# Patient Record
Sex: Female | Born: 1955
Health system: Southern US, Community
[De-identification: ages and names within clinical notes are randomized; demographics above are authoritative.]

## PROBLEM LIST (undated history)

## (undated) DIAGNOSIS — F411 Generalized anxiety disorder: Secondary | ICD-10-CM

## (undated) DIAGNOSIS — K219 Gastro-esophageal reflux disease without esophagitis: Secondary | ICD-10-CM

## (undated) DIAGNOSIS — Z86010 Personal history of colonic polyps: Secondary | ICD-10-CM

## (undated) DIAGNOSIS — T7840XA Allergy, unspecified, initial encounter: Secondary | ICD-10-CM

## (undated) DIAGNOSIS — R7309 Other abnormal glucose: Secondary | ICD-10-CM

## (undated) DIAGNOSIS — Z5189 Encounter for other specified aftercare: Secondary | ICD-10-CM

## (undated) DIAGNOSIS — E039 Hypothyroidism, unspecified: Secondary | ICD-10-CM

## (undated) DIAGNOSIS — J45909 Unspecified asthma, uncomplicated: Secondary | ICD-10-CM

## (undated) DIAGNOSIS — D649 Anemia, unspecified: Secondary | ICD-10-CM

## (undated) DIAGNOSIS — M1990 Unspecified osteoarthritis, unspecified site: Secondary | ICD-10-CM

## (undated) DIAGNOSIS — I10 Essential (primary) hypertension: Secondary | ICD-10-CM

## (undated) DIAGNOSIS — J309 Allergic rhinitis, unspecified: Secondary | ICD-10-CM

## (undated) HISTORY — DX: Allergic rhinitis, unspecified: J30.9

## (undated) HISTORY — PX: COLONOSCOPY: SHX174

## (undated) HISTORY — DX: Unspecified osteoarthritis, unspecified site: M19.90

## (undated) HISTORY — PX: DILATION AND CURETTAGE OF UTERUS: SHX78

## (undated) HISTORY — DX: Generalized anxiety disorder: F41.1

## (undated) HISTORY — DX: Other abnormal glucose: R73.09

## (undated) HISTORY — DX: Allergy, unspecified, initial encounter: T78.40XA

## (undated) HISTORY — DX: Gastro-esophageal reflux disease without esophagitis: K21.9

## (undated) HISTORY — DX: Encounter for other specified aftercare: Z51.89

## (undated) HISTORY — DX: Essential (primary) hypertension: I10

## (undated) HISTORY — DX: Unspecified asthma, uncomplicated: J45.909

## (undated) HISTORY — DX: Hypothyroidism, unspecified: E03.9

## (undated) HISTORY — DX: Anemia, unspecified: D64.9

## (undated) HISTORY — DX: Personal history of colonic polyps: Z86.010

---

## 1993-05-07 HISTORY — PX: HERNIA REPAIR: SHX51

## 1995-05-08 HISTORY — PX: VEIN SURGERY: SHX48

## 1995-05-08 HISTORY — PX: MYOMECTOMY: SHX85

## 2002-05-07 HISTORY — PX: BREAST SURGERY: SHX581

## 2002-05-07 HISTORY — PX: THYROIDECTOMY, PARTIAL: SHX18

## 2004-05-07 HISTORY — PX: FOOT SURGERY: SHX648

## 2006-11-14 ENCOUNTER — Ambulatory Visit: Payer: Self-pay | Admitting: Family Medicine

## 2006-11-14 ENCOUNTER — Ambulatory Visit: Payer: Self-pay | Admitting: *Deleted

## 2006-11-15 ENCOUNTER — Ambulatory Visit (HOSPITAL_COMMUNITY): Admission: RE | Admit: 2006-11-15 | Discharge: 2006-11-15 | Payer: Self-pay | Admitting: Family Medicine

## 2007-01-06 ENCOUNTER — Emergency Department (HOSPITAL_COMMUNITY): Admission: EM | Admit: 2007-01-06 | Discharge: 2007-01-06 | Payer: Self-pay | Admitting: *Deleted

## 2007-03-03 ENCOUNTER — Ambulatory Visit: Payer: Self-pay | Admitting: Internal Medicine

## 2007-03-31 ENCOUNTER — Emergency Department (HOSPITAL_COMMUNITY): Admission: EM | Admit: 2007-03-31 | Discharge: 2007-03-31 | Payer: Self-pay | Admitting: Emergency Medicine

## 2007-04-07 ENCOUNTER — Ambulatory Visit: Payer: Self-pay | Admitting: Internal Medicine

## 2007-04-07 ENCOUNTER — Encounter (INDEPENDENT_AMBULATORY_CARE_PROVIDER_SITE_OTHER): Payer: Self-pay | Admitting: Family Medicine

## 2007-04-07 LAB — CONVERTED CEMR LAB
AST: 29 units/L (ref 0–37)
Albumin: 3.9 g/dL (ref 3.5–5.2)
Alkaline Phosphatase: 102 units/L (ref 39–117)
Basophils Relative: 1 % (ref 0–1)
Chlamydia, DNA Probe: NEGATIVE
Chloride: 107 meq/L (ref 96–112)
Creatinine, Ser: 0.79 mg/dL (ref 0.40–1.20)
Eosinophils Relative: 5 % (ref 0–5)
Hemoglobin: 8.7 g/dL — ABNORMAL LOW (ref 12.0–15.0)
MCHC: 27.5 g/dL — ABNORMAL LOW (ref 30.0–36.0)
Monocytes Absolute: 0.5 10*3/uL (ref 0.1–1.0)
Monocytes Relative: 9 % (ref 3–12)
Neutro Abs: 2.9 10*3/uL (ref 1.7–7.7)
Platelets: 239 10*3/uL (ref 150–400)
RDW: 19 % — ABNORMAL HIGH (ref 11.5–15.5)
Sodium: 138 meq/L (ref 135–145)
TSH: 1.418 microintl units/mL (ref 0.350–5.50)
Total Protein: 6.9 g/dL (ref 6.0–8.3)

## 2007-04-08 ENCOUNTER — Ambulatory Visit (HOSPITAL_COMMUNITY): Admission: RE | Admit: 2007-04-08 | Discharge: 2007-04-08 | Payer: Self-pay | Admitting: Family Medicine

## 2007-04-29 ENCOUNTER — Ambulatory Visit: Payer: Self-pay | Admitting: Family Medicine

## 2007-04-29 LAB — CONVERTED CEMR LAB
HCV Ab: POSITIVE — AB
HCV Genotype: 1
HCV Quantitative: 257000 [IU]/mL — ABNORMAL HIGH
Hep A Total Ab: NEGATIVE
Hep B Core Total Ab: NEGATIVE
Hep B E Ab: NEGATIVE
Hep B S Ab: NEGATIVE

## 2007-05-22 ENCOUNTER — Ambulatory Visit: Payer: Self-pay | Admitting: Family Medicine

## 2007-06-05 ENCOUNTER — Emergency Department (HOSPITAL_COMMUNITY): Admission: EM | Admit: 2007-06-05 | Discharge: 2007-06-05 | Payer: Self-pay | Admitting: Emergency Medicine

## 2007-06-09 ENCOUNTER — Ambulatory Visit (HOSPITAL_COMMUNITY): Admission: RE | Admit: 2007-06-09 | Discharge: 2007-06-09 | Payer: Self-pay | Admitting: Family Medicine

## 2007-06-25 ENCOUNTER — Ambulatory Visit: Payer: Self-pay | Admitting: Internal Medicine

## 2007-06-26 ENCOUNTER — Ambulatory Visit: Payer: Self-pay | Admitting: Internal Medicine

## 2007-06-27 ENCOUNTER — Ambulatory Visit: Payer: Self-pay | Admitting: Internal Medicine

## 2007-07-19 ENCOUNTER — Emergency Department (HOSPITAL_COMMUNITY): Admission: EM | Admit: 2007-07-19 | Discharge: 2007-07-19 | Payer: Self-pay | Admitting: Emergency Medicine

## 2007-09-12 ENCOUNTER — Emergency Department (HOSPITAL_COMMUNITY): Admission: EM | Admit: 2007-09-12 | Discharge: 2007-09-12 | Payer: Self-pay | Admitting: Emergency Medicine

## 2007-10-09 ENCOUNTER — Emergency Department (HOSPITAL_COMMUNITY): Admission: EM | Admit: 2007-10-09 | Discharge: 2007-10-09 | Payer: Self-pay | Admitting: Emergency Medicine

## 2007-10-11 ENCOUNTER — Emergency Department (HOSPITAL_COMMUNITY): Admission: EM | Admit: 2007-10-11 | Discharge: 2007-10-11 | Payer: Self-pay | Admitting: Emergency Medicine

## 2007-10-13 ENCOUNTER — Emergency Department (HOSPITAL_COMMUNITY): Admission: EM | Admit: 2007-10-13 | Discharge: 2007-10-13 | Payer: Self-pay | Admitting: Emergency Medicine

## 2007-10-28 ENCOUNTER — Emergency Department (HOSPITAL_COMMUNITY): Admission: EM | Admit: 2007-10-28 | Discharge: 2007-10-29 | Payer: Self-pay | Admitting: Physician Assistant

## 2007-10-30 ENCOUNTER — Inpatient Hospital Stay (HOSPITAL_COMMUNITY): Admission: EM | Admit: 2007-10-30 | Discharge: 2007-11-04 | Payer: Self-pay | Admitting: Emergency Medicine

## 2007-12-15 ENCOUNTER — Emergency Department (HOSPITAL_COMMUNITY): Admission: EM | Admit: 2007-12-15 | Discharge: 2007-12-15 | Payer: Self-pay | Admitting: Emergency Medicine

## 2008-02-21 ENCOUNTER — Emergency Department (HOSPITAL_COMMUNITY): Admission: EM | Admit: 2008-02-21 | Discharge: 2008-02-21 | Payer: Self-pay | Admitting: Emergency Medicine

## 2008-03-18 ENCOUNTER — Emergency Department (HOSPITAL_COMMUNITY): Admission: EM | Admit: 2008-03-18 | Discharge: 2008-03-18 | Payer: Self-pay | Admitting: Emergency Medicine

## 2008-04-25 ENCOUNTER — Emergency Department (HOSPITAL_COMMUNITY): Admission: EM | Admit: 2008-04-25 | Discharge: 2008-04-25 | Payer: Self-pay | Admitting: Emergency Medicine

## 2008-04-28 ENCOUNTER — Emergency Department (HOSPITAL_COMMUNITY): Admission: EM | Admit: 2008-04-28 | Discharge: 2008-04-28 | Payer: Self-pay | Admitting: Emergency Medicine

## 2008-05-25 ENCOUNTER — Emergency Department (HOSPITAL_COMMUNITY): Admission: EM | Admit: 2008-05-25 | Discharge: 2008-05-25 | Payer: Self-pay | Admitting: Family Medicine

## 2008-06-11 ENCOUNTER — Emergency Department (HOSPITAL_COMMUNITY): Admission: EM | Admit: 2008-06-11 | Discharge: 2008-06-11 | Payer: Self-pay | Admitting: Emergency Medicine

## 2008-07-21 ENCOUNTER — Emergency Department (HOSPITAL_COMMUNITY): Admission: EM | Admit: 2008-07-21 | Discharge: 2008-07-21 | Payer: Self-pay | Admitting: Emergency Medicine

## 2008-08-20 ENCOUNTER — Ambulatory Visit: Payer: Self-pay | Admitting: Internal Medicine

## 2008-08-20 DIAGNOSIS — E039 Hypothyroidism, unspecified: Secondary | ICD-10-CM | POA: Insufficient documentation

## 2008-08-20 DIAGNOSIS — J45909 Unspecified asthma, uncomplicated: Secondary | ICD-10-CM

## 2008-08-20 DIAGNOSIS — F419 Anxiety disorder, unspecified: Secondary | ICD-10-CM

## 2008-08-20 DIAGNOSIS — F329 Major depressive disorder, single episode, unspecified: Secondary | ICD-10-CM

## 2008-08-20 DIAGNOSIS — K219 Gastro-esophageal reflux disease without esophagitis: Secondary | ICD-10-CM

## 2008-08-20 DIAGNOSIS — D649 Anemia, unspecified: Secondary | ICD-10-CM

## 2008-08-20 DIAGNOSIS — J309 Allergic rhinitis, unspecified: Secondary | ICD-10-CM | POA: Insufficient documentation

## 2008-08-20 DIAGNOSIS — I10 Essential (primary) hypertension: Secondary | ICD-10-CM

## 2008-08-20 LAB — CONVERTED CEMR LAB
ALT: 39 units/L — ABNORMAL HIGH (ref 0–35)
AST: 40 units/L — ABNORMAL HIGH (ref 0–37)
Albumin: 3.5 g/dL (ref 3.5–5.2)
BUN: 9 mg/dL (ref 6–23)
Basophils Relative: 1 % (ref 0.0–3.0)
CO2: 28 meq/L (ref 19–32)
Creatinine, Ser: 0.7 mg/dL (ref 0.4–1.2)
Eosinophils Relative: 6.9 % — ABNORMAL HIGH (ref 0.0–5.0)
GFR calc non Af Amer: 113.35 mL/min (ref >60)
Glucose, Bld: 100 mg/dL — ABNORMAL HIGH (ref 70–99)
HCT: 37 % (ref 36.0–46.0)
MCV: 83.1 fL (ref 78.0–100.0)
Neutrophils Relative %: 56.3 % (ref 43.0–77.0)
Nitrite: NEGATIVE
Potassium: 4.5 meq/L (ref 3.5–5.1)
RBC: 4.45 M/uL (ref 3.87–5.11)
RDW: 17.5 % — ABNORMAL HIGH (ref 11.5–14.6)
TSH: 0.31 microintl units/mL — ABNORMAL LOW (ref 0.35–5.50)
Total Bilirubin: 0.5 mg/dL (ref 0.3–1.2)
Total Protein: 7.3 g/dL (ref 6.0–8.3)
Urine Glucose: NEGATIVE mg/dL
pH: 7 (ref 5.0–8.0)

## 2008-08-23 ENCOUNTER — Encounter: Payer: Self-pay | Admitting: Internal Medicine

## 2008-09-03 ENCOUNTER — Telehealth: Payer: Self-pay | Admitting: Internal Medicine

## 2008-09-03 ENCOUNTER — Ambulatory Visit: Payer: Self-pay | Admitting: Internal Medicine

## 2008-09-03 DIAGNOSIS — M549 Dorsalgia, unspecified: Secondary | ICD-10-CM | POA: Insufficient documentation

## 2008-09-03 DIAGNOSIS — R748 Abnormal levels of other serum enzymes: Secondary | ICD-10-CM | POA: Insufficient documentation

## 2008-09-03 DIAGNOSIS — R799 Abnormal finding of blood chemistry, unspecified: Secondary | ICD-10-CM | POA: Insufficient documentation

## 2008-09-03 DIAGNOSIS — R7309 Other abnormal glucose: Secondary | ICD-10-CM

## 2008-09-03 LAB — CONVERTED CEMR LAB
Basophils Relative: 2.2 % (ref 0.0–3.0)
Eosinophils Absolute: 0.6 10*3/uL (ref 0.0–0.7)
HCT: 41.2 % (ref 36.0–46.0)
Hemoglobin: 13.8 g/dL (ref 12.0–15.0)
Hep A Total Ab: NEGATIVE
Hep B Core Total Ab: NEGATIVE
Hep B S Ab: NEGATIVE
Hgb A1c MFr Bld: 6.1 % (ref 4.6–6.5)
Lymphs Abs: 1.4 10*3/uL (ref 0.7–4.0)
MCV: 81.5 fL (ref 78.0–100.0)
Monocytes Relative: 0.6 % — ABNORMAL LOW (ref 3.0–12.0)
Neutro Abs: 10.3 10*3/uL — ABNORMAL HIGH (ref 1.4–7.7)
Platelets: 35 10*3/uL — CL (ref 150.0–400.0)
RDW: 17.5 % — ABNORMAL HIGH (ref 11.5–14.6)
Total Bilirubin: 0.6 mg/dL (ref 0.3–1.2)
WBC: 12.7 10*3/uL — ABNORMAL HIGH (ref 4.5–10.5)

## 2008-09-08 ENCOUNTER — Encounter: Payer: Self-pay | Admitting: Internal Medicine

## 2008-12-31 ENCOUNTER — Telehealth (INDEPENDENT_AMBULATORY_CARE_PROVIDER_SITE_OTHER): Payer: Self-pay | Admitting: *Deleted

## 2009-02-07 ENCOUNTER — Telehealth: Payer: Self-pay | Admitting: Internal Medicine

## 2009-03-10 ENCOUNTER — Telehealth: Payer: Self-pay | Admitting: Internal Medicine

## 2009-03-30 ENCOUNTER — Telehealth: Payer: Self-pay | Admitting: Internal Medicine

## 2009-04-06 ENCOUNTER — Encounter: Payer: Self-pay | Admitting: Internal Medicine

## 2009-04-28 ENCOUNTER — Emergency Department (HOSPITAL_COMMUNITY): Admission: EM | Admit: 2009-04-28 | Discharge: 2009-04-28 | Payer: Self-pay | Admitting: Emergency Medicine

## 2009-05-07 DIAGNOSIS — Z860100 Personal history of colon polyps, unspecified: Secondary | ICD-10-CM

## 2009-05-07 HISTORY — DX: Personal history of colon polyps, unspecified: Z86.0100

## 2009-05-07 HISTORY — DX: Personal history of colonic polyps: Z86.010

## 2009-05-12 ENCOUNTER — Emergency Department (HOSPITAL_COMMUNITY): Admission: EM | Admit: 2009-05-12 | Discharge: 2009-05-12 | Payer: Self-pay | Admitting: Emergency Medicine

## 2009-08-14 ENCOUNTER — Emergency Department (HOSPITAL_COMMUNITY): Admission: EM | Admit: 2009-08-14 | Discharge: 2009-08-14 | Payer: Self-pay | Admitting: Family Medicine

## 2009-09-19 ENCOUNTER — Emergency Department (HOSPITAL_COMMUNITY): Admission: EM | Admit: 2009-09-19 | Discharge: 2009-09-19 | Payer: Self-pay | Admitting: Family Medicine

## 2009-10-13 ENCOUNTER — Telehealth: Payer: Self-pay | Admitting: Internal Medicine

## 2009-12-08 ENCOUNTER — Ambulatory Visit: Payer: Self-pay | Admitting: Internal Medicine

## 2009-12-08 DIAGNOSIS — M722 Plantar fascial fibromatosis: Secondary | ICD-10-CM | POA: Insufficient documentation

## 2010-01-08 ENCOUNTER — Emergency Department (HOSPITAL_COMMUNITY): Admission: EM | Admit: 2010-01-08 | Discharge: 2010-01-08 | Payer: Self-pay | Admitting: Family Medicine

## 2010-01-11 ENCOUNTER — Ambulatory Visit: Payer: Self-pay | Admitting: Internal Medicine

## 2010-01-11 LAB — CONVERTED CEMR LAB
AST: 33 units/L (ref 0–37)
BUN: 19 mg/dL (ref 6–23)
Basophils Absolute: 0.1 10*3/uL (ref 0.0–0.1)
Basophils Relative: 0.8 % (ref 0.0–3.0)
CO2: 27 meq/L (ref 19–32)
Chloride: 102 meq/L (ref 96–112)
Cholesterol: 184 mg/dL (ref 0–200)
Eosinophils Absolute: 0.3 10*3/uL (ref 0.0–0.7)
GFR calc non Af Amer: 82.24 mL/min (ref >60)
HCT: 37.5 % (ref 36.0–46.0)
HDL: 37.4 mg/dL — ABNORMAL LOW (ref >39.00)
Hemoglobin, Urine: NEGATIVE
Lymphocytes Relative: 30 % (ref 12.0–46.0)
Lymphs Abs: 2.1 10*3/uL (ref 0.7–4.0)
Monocytes Relative: 7.2 % (ref 3.0–12.0)
Neutro Abs: 4.1 10*3/uL (ref 1.4–7.7)
Nitrite: NEGATIVE
Platelets: 149 10*3/uL — ABNORMAL LOW (ref 150.0–400.0)
Potassium: 4.5 meq/L (ref 3.5–5.1)
RBC: 4.39 M/uL (ref 3.87–5.11)
Specific Gravity, Urine: 1.025 (ref 1.000–1.030)
Total Protein, Urine: NEGATIVE mg/dL
Triglycerides: 116 mg/dL (ref 0.0–149.0)
Urine Glucose: NEGATIVE mg/dL
Urobilinogen, UA: 4 (ref 0.0–1.0)
VLDL: 23.2 mg/dL (ref 0.0–40.0)
WBC: 7.1 10*3/uL (ref 4.5–10.5)
pH: 6.5 (ref 5.0–8.0)

## 2010-01-13 ENCOUNTER — Encounter: Payer: Self-pay | Admitting: Internal Medicine

## 2010-01-13 ENCOUNTER — Ambulatory Visit: Payer: Self-pay | Admitting: Internal Medicine

## 2010-01-17 ENCOUNTER — Encounter: Payer: Self-pay | Admitting: Gastroenterology

## 2010-03-09 ENCOUNTER — Encounter (INDEPENDENT_AMBULATORY_CARE_PROVIDER_SITE_OTHER): Payer: Self-pay | Admitting: *Deleted

## 2010-03-13 ENCOUNTER — Ambulatory Visit: Payer: Self-pay | Admitting: Gastroenterology

## 2010-03-22 ENCOUNTER — Telehealth: Payer: Self-pay | Admitting: Internal Medicine

## 2010-03-27 ENCOUNTER — Ambulatory Visit: Payer: Self-pay | Admitting: Gastroenterology

## 2010-03-27 LAB — HM COLONOSCOPY

## 2010-03-29 ENCOUNTER — Encounter: Payer: Self-pay | Admitting: Gastroenterology

## 2010-04-19 ENCOUNTER — Ambulatory Visit (HOSPITAL_COMMUNITY)
Admission: RE | Admit: 2010-04-19 | Discharge: 2010-04-19 | Payer: Self-pay | Source: Home / Self Care | Attending: Internal Medicine | Admitting: Internal Medicine

## 2010-04-28 ENCOUNTER — Encounter: Payer: Self-pay | Admitting: Internal Medicine

## 2010-05-04 ENCOUNTER — Encounter
Admission: RE | Admit: 2010-05-04 | Discharge: 2010-05-04 | Payer: Self-pay | Source: Home / Self Care | Attending: Internal Medicine | Admitting: Internal Medicine

## 2010-05-23 ENCOUNTER — Emergency Department (HOSPITAL_COMMUNITY)
Admission: EM | Admit: 2010-05-23 | Discharge: 2010-05-23 | Payer: Self-pay | Source: Home / Self Care | Admitting: Family Medicine

## 2010-05-28 ENCOUNTER — Encounter: Payer: Self-pay | Admitting: Family Medicine

## 2010-06-07 NOTE — Assessment & Plan Note (Signed)
Summary: CPX-LB  RS'D DUE TO BUMP/NWS   Vital Signs:  Patient profile:   55 year old female Height:      62 inches (157.48 cm) Weight:      214.8 pounds (97.64 kg) O2 Sat:      98 % on Room air Temp:     97.1 degrees F (36.17 degrees C) oral Pulse rate:   85 / minute BP sitting:   128 / 84  (right arm) Cuff size:   large  Vitals Entered By: Orlan Leavens RMA (January 13, 2010 2:56 PM)  O2 Flow:  Room air CC: CPX Is Patient Diabetic? No Pain Assessment Patient in pain? no        Primary Care Provider:  Newt Lukes MD  CC:  CPX.  History of Present Illness: patient is here today for annual physical. Patient feels well and has no complaints.   also review other medical issues:  depression/anxiety - has been on lexapro and celexa in past - resumed citalopram, trazodone in 12/2009 along with seroquel - feels better sleeping well - no adv SE and 100% med compliance  plantar fascitis, right- c/o continued right foot and ankle pain - foot pain  worst at heel  onset symptoms late spring 2011- pain worst when getting out of bed in AM - 1st steps "real bad" - now a/w swelling at bottom of foor and lateral ankle - no sig relief with ibuprofen or tylenol - improved with voltaren but not resolved ?pain pill for help sleeping at night until she sees podiatry (appt next week)  HTN - reports compliance with ongoing medical treatment and no changes in medication dose or frequency. denies adverse side effects related to current therapy.  no CP or edema  hypothyroid - reports compliance with ongoing medical treatment and no changes in medication dose or frequency. denies adverse side effects related to current therapy.   asthma - reports compliance with ongoing medical treatment and no changes in medication dose or frequency. denies adverse side effects related to current therapy.    Preventive Screening-Counseling & Management  Alcohol-Tobacco     Alcohol drinks/day: <1  Alcohol Counseling: not indicated; patient does not drink     Smoking Status: current     Smoking Cessation Counseling: yes     Smoke Cessation Stage: precontemplative     Packs/Day: 0.75     Year Started: 1984     Pack years: 50  Caffeine-Diet-Exercise     Does Patient Exercise: no     Exercise Counseling: to improve exercise regimen     Depression Counseling: not indicated; screening negative for depression  Safety-Violence-Falls     Seat Belt Counseling: not applicable     Firearms in the Home: no firearms in the home     Smoke Detectors: yes     Violence in the Home: no risk noted     Sexual Abuse: no     Fall Risk Counseling: not indicated; no significant falls noted  Clinical Review Panels:  Immunizations   Last Tetanus Booster:  Historical (05/07/2006)   Last Flu Vaccine:  Fluvax 3+ (01/13/2010)  Lipid Management   Cholesterol:  184 (01/11/2010)   LDL (bad choesterol):  123 (01/11/2010)   HDL (good cholesterol):  37.40 (01/11/2010)  CBC   WBC:  7.1 (01/11/2010)   RBC:  4.39 (01/11/2010)   Hgb:  12.3 (01/11/2010)   Hct:  37.5 (01/11/2010)   Platelets:  149.0 (01/11/2010)   MCV  85.5 (01/11/2010)   MCHC  32.7 (01/11/2010)   RDW  16.9 (01/11/2010)   PMN:  57.9 (01/11/2010)   Lymphs:  30.0 (01/11/2010)   Monos:  7.2 (01/11/2010)   Eosinophils:  4.1 (01/11/2010)   Basophil:  0.8 (01/11/2010)  Complete Metabolic Panel   Glucose:  96 (01/11/2010)   Sodium:  137 (01/11/2010)   Potassium:  4.5 (01/11/2010)   Chloride:  102 (01/11/2010)   CO2:  27 (01/11/2010)   BUN:  19 (01/11/2010)   Creatinine:  0.9 (01/11/2010)   Albumin:  3.7 (01/11/2010)   Total Protein:  7.1 (01/11/2010)   Calcium:  8.9 (01/11/2010)   Total Bili:  0.4 (01/11/2010)   Alk Phos:  83 (01/11/2010)   SGPT (ALT):  32 (01/11/2010)   SGOT (AST):  33 (01/11/2010)   Current Medications (verified): 1)  Lisinopril-Hydrochlorothiazide 10-12.5 Mg Tabs (Lisinopril-Hydrochlorothiazide) .... Take 1  Tablet By Mouth Once A Day 2)  Levothyroxine Sodium 100 Mcg Tabs (Levothyroxine Sodium) .... Take 1 Tablet By Mouth Once A Day 3)  Buspar 10 Mg Tabs (Buspirone Hcl) .... One By Mouth Tid 4)  Celexa 40 Mg Tabs (Citalopram Hydrobromide) .... Once Daily 5)  Proair Hfa 108 (90 Base) Mcg/act Aers (Albuterol Sulfate) .Marland Kitchen.. 1-2 Puffs Qid As Needed For Wheezing 6)  Trazodone Hcl 100 Mg Tabs (Trazodone Hcl) .... 1/2 - 1 By Mouth At Bedtime As Needed For Insomnia 7)  Qvar 40 Mcg/act Aers (Beclomethasone Dipropionate) .... One Puff Two Times A Day Every Day For Asthma 8)  Nasonex 50 Mcg/act Susp (Mometasone Furoate) .... 2 Puffs Each Nostril Once Daily 9)  Ultram 50 Mg Tabs (Tramadol Hcl) .Marland Kitchen.. 1-2 By Mouth Q 6 Hours As Needed For Pain 10)  Seroquel Xr 50 Mg Xr24h-Tab (Quetiapine Fumarate) .Marland Kitchen.. 1 By Mouth Once Daily 11)  Voltaren 1 % Gel (Diclofenac Sodium) .... Apply To Painful Area On Foot Three Times A Day X 5 Days, Then As Needed  Allergies (verified): 1)  ! Naprosyn  Past History:  Past medical, surgical, family and social histories (including risk factors) reviewed, and no changes noted (except as noted below).  Past Medical History: Allergic rhinitis Anxiety/depression Asthma GERD Hypertension Hypothyroidism  Past Surgical History: Inguinal herniorrhaphy - myomectomy - 1997 Lumpectomy, right side - benign - 2004 Thyroidectomy- partial left side - 2004  Family History: Reviewed history from 12/08/2009 and no changes required. Family History of Arthritis Family History Hypertension Family Hx - DM, panc cancer  mom - age 61y - HTN, OA dad - expired age 110y - MVA - healthy prior  sister - DM  Social History: Reviewed history from 12/08/2009 and no changes required. 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 I have reviewed all other systems and they were negative.   Physical Exam  General:  overweight-appearing.  alert, well-developed, well-nourished, and cooperative to examination.    Head:  Normocephalic and atraumatic without obvious abnormalities. No apparent alopecia or balding. Eyes:  vision grossly intact; pupils equal, round and reactive to light.  conjunctiva and lids normal.    Ears:  normal pinnae bilaterally, without erythema, swelling, or tenderness to palpation. TMs clear, without effusion, or cerumen impaction. Hearing grossly normal bilaterally  Mouth:  teeth and gums in good repair; mucous membranes moist, without lesions or ulcers. oropharynx clear without  exudate, no erythema.  Neck:  thick, supple, full ROM, no masses, no thyromegaly; no thyroid nodules or tenderness. no JVD or carotid bruits.    Lungs:  normal respiratory effort, no intercostal retractions or use of accessory muscles; normal breath sounds bilaterally - no crackles and no wheezes.    Heart:  normal rate, regular rhythm, no murmur, and no rub. BLE without edema. normal DP pulses and normal cap refill in all 4 extremities    Abdomen:  soft, non-tender, normal bowel sounds, no distention; no masses and no appreciable hepatomegaly or splenomegaly.   Genitalia:  defer to gyn Msk:  right plantar fascia tender to palp and mildly swollen - ankle FROM w/o pain but tender to palp over lateral side Neurologic:  alert & oriented X3 and cranial nerves II-XII symetrically intact.  strength normal in all extremities, sensation intact to light touch, and gait normal. speech fluent without dysarthria or aphasia; follows commands with good comprehension.  Skin:  no rashes, vesicles, ulcers, or erythema. No nodules or irregularity to palpation. multiple skin tags at neck Psych:  Oriented X3, memory intact for recent and remote, normally interactive, good eye contact, not anxious appearing, not depressed appearing, and not  agitated.      Impression & Recommendations:  Problem # 1:  PREVENTIVE HEALTH CARE (ICD-V70.0) Patient has been counseled on age-appropriate routine health concerns for screening and prevention. These are reviewed and up-to-date. Immunizations are up-to-date or declined. Labs and ECG reviewed.  Orders: EKG w/ Interpretation (93000) Gynecologic Referral (Gyn) Gastroenterology Referral (GI) Misc. Referral (Misc. Ref)  Problem # 2:  PLANTAR FASCIITIS, RIGHT (ICD-728.71)  cont tx voltaren gel, improved but not resolved - planning to see podiatry for same ok to use at bedtime vicodin as needed - no refills to be provided for same  Problem # 3:  ANXIETY (ICD-300.00)  Her updated medication list for this problem includes:    Buspar 10 Mg Tabs (Buspirone hcl) ..... One by mouth tid    Celexa 40 Mg Tabs (Citalopram hydrobromide) ..... Once daily    Trazodone Hcl 100 Mg Tabs (Trazodone hcl) .Marland Kitchen... 1/2 - 1 by mouth at bedtime as needed for insomnia  symptoms seem improved - cont same combo tx  Problem # 4:  HYPERTENSION (ICD-401.9)  Her updated medication list for this problem includes:    Lisinopril-hydrochlorothiazide 10-12.5 Mg Tabs (Lisinopril-hydrochlorothiazide) .Marland Kitchen... Take 1 tablet by mouth once a day  BP today: 128/84 Prior BP: 122/82 (12/08/2009)  Prior 10 Yr Risk Heart Disease: Not enough information (08/20/2008)  Labs Reviewed: K+: 4.5 (01/11/2010) Creat: : 0.9 (01/11/2010)   Chol: 184 (01/11/2010)   HDL: 37.40 (01/11/2010)   LDL: 123 (01/11/2010)   TG: 116.0 (01/11/2010)  Problem # 5:  HYPOTHYROIDISM (ICD-244.9)  Her updated medication list for this problem includes:    Levothyroxine Sodium 100 Mcg Tabs (Levothyroxine sodium) .Marland Kitchen... Take 1 tablet by mouth once a day s/p prior thyroidectomy - on same tx w/o change >73yr - mild depressed TSH reviewed - will cont same for now and recheck 3 mo, sooner if symptoms orproblems - pt agrees to same - reviewed signs of hyper and  hypo thyroid today  Labs Reviewed: TSH: 0.21 (01/11/2010)    HgBA1c: 6.1 (09/03/2008) Chol: 184 (01/11/2010)   HDL: 37.40 (01/11/2010)   LDL: 123 (01/11/2010)   TG: 116.0 (01/11/2010)  Complete Medication List: 1)  Lisinopril-hydrochlorothiazide 10-12.5 Mg Tabs (Lisinopril-hydrochlorothiazide) .... Take 1 tablet by mouth once a day 2)  Levothyroxine Sodium 100  Mcg Tabs (Levothyroxine sodium) .... Take 1 tablet by mouth once a day 3)  Buspar 10 Mg Tabs (Buspirone hcl) .... One by mouth tid 4)  Celexa 40 Mg Tabs (Citalopram hydrobromide) .... Once daily 5)  Proair Hfa 108 (90 Base) Mcg/act Aers (Albuterol sulfate) .Marland Kitchen.. 1-2 puffs qid as needed for wheezing 6)  Trazodone Hcl 100 Mg Tabs (Trazodone hcl) .... 1/2 - 1 by mouth at bedtime as needed for insomnia 7)  Qvar 40 Mcg/act Aers (Beclomethasone dipropionate) .... One puff two times a day every day for asthma 8)  Nasonex 50 Mcg/act Susp (Mometasone furoate) .... 2 puffs each nostril once daily 9)  Ultram 50 Mg Tabs (Tramadol hcl) .Marland Kitchen.. 1-2 by mouth q 6 hours as needed for pain 10)  Seroquel Xr 50 Mg Xr24h-tab (Quetiapine fumarate) .Marland Kitchen.. 1 by mouth once daily 11)  Voltaren 1 % Gel (Diclofenac sodium) .... Apply to painful area on foot three times a day x 5 days, then as needed 12)  Hydrocodone-acetaminophen 5-500 Mg Tabs (Hydrocodone-acetaminophen) .Marland Kitchen.. 1 by mouth at bedtime as needed for pain  Other Orders: Admin 1st Vaccine (19147) Flu Vaccine 59yrs + (82956) Flu Vaccine Consent Questions     Do you have a history of severe allergic reactions to this vaccine? no    Any prior history of allergic reactions to egg and/or gelatin? no    Do you have a sensitivity to the preservative Thimersol? no    Do you have a past history of Guillan-Barre Syndrome? no    Do you currently have an acute febrile illness? no    Have you ever had a severe reaction to latex? no    Vaccine information given and explained to patient? yes    Are you currently  pregnant? no    Lot Number:[id number]   Exp Date:11/04/2010   Site Given  Right Deltoid IM  Patient Instructions: 1)  it was good to see you today. 2)  continue celexa with seroquel for anxiety and depression  symptoms - 3)  continue to use voltaren gel for foot pain and may use vicodin as needed at night (at least until you see the foot doctor) - prescription provided today 4)  we'll make referral to gynecology, GI for colonoscopy and mammogram. Our office will contact you regarding these appointments once made.  5)  Please schedule a follow-up appointment in 3-4 months to monitor thyroid, blood pressure and depression symptoms; call sooner if problems.  Prescriptions: HYDROCODONE-ACETAMINOPHEN 5-500 MG TABS (HYDROCODONE-ACETAMINOPHEN) 1 by mouth at bedtime as needed for pain  #30 x 0   Entered and Authorized by:   Newt Lukes MD   Signed by:   Newt Lukes MD on 01/13/2010   Method used:   Print then Give to Patient   RxID:   [id number]    .lbflu

## 2010-06-07 NOTE — Procedures (Signed)
Summary: Colonoscopy  Patient: [name] Note: All result statuses are Final unless otherwise noted.  Tests: (1) Colonoscopy (COL)   COL Colonoscopy           DONE     [facility]     [address]     [place], [place]  [address]           COLONOSCOPY PROCEDURE REPORT           PATIENT:  Wendy Carr, Wendy Carr  MR#:  [id number]     BIRTHDATE:  06-02-1955, 54 yrs. old  GENDER:  female     ENDOSCOPIST:  Vania Rea. Jarold Motto, MD, [facility]     REF. BY:  Rene Paci, M.D.     PROCEDURE DATE:  03/27/2010     PROCEDURE:  Colonoscopy with snare polypectomy     ASA CLASS:  Class II     INDICATIONS:  Routine Risk Screening     MEDICATIONS:   Fentanyl 75 mcg IV, Versed 10 mg IV           DESCRIPTION OF PROCEDURE:   After the risks benefits and     alternatives of the procedure were thoroughly explained, informed     consent was obtained.  Digital rectal exam was performed and     revealed no abnormalities.   The LB CF-[id number] endoscope     was introduced through the anus and advanced to the cecum, which     was identified by both the appendix and ileocecal valve, limited     by a redundant colon.    The quality of the prep was excellent,     using MoviPrep.  The instrument was then slowly withdrawn as the     colon was fully examined.     <<PROCEDUREIMAGES>>           FINDINGS:  There were multiple polyps identified and removed.     transverse to sigmoid FLAT 3-4MM POLYPS COLD SNARE EXCISED.  This     was otherwise a normal examination of the colon.   Retroflexed     views in the rectum revealed no abnormalities.    The scope was     then withdrawn from the patient and the procedure completed.           COMPLICATIONS:  None     ENDOSCOPIC IMPRESSION:     1) Polyps, multiple in the transverse to sigmoid     2) Otherwise normal examination     R/O ADENOMAS.     RECOMMENDATIONS:     1) Repeat colonoscopy in 5 years if polyp adenomatous; otherwise     10 years  REPEAT EXAM:  No           ______________________________     Vania Rea. Jarold Motto, MD, [facility]           CC:           n.     eSIGNED:   Vania Rea. Patterson at 03/27/2010 12:04 PM           Karenann Cai, [id number]  Note: An exclamation mark (!) indicates a result that was not dispersed into the flowsheet. Document Creation Date: 03/27/2010 12:03 PM _______________________________________________________________________  (1) Order result status: Final Collection or observation date-time: 03/27/2010 11:59 Requested date-time:  Receipt date-time:  Reported date-time:  Referring Physician:   Ordering Physician: Sheryn Bison [phone]) Specimen Source:  Source: Launa Grill Order Number: [id number] Lab site:   Appended  Document: Colonoscopy 5Y Follow up PER YOUNG AGE.Marland KitchenMarland Kitchen  Appended Document: Colonoscopy     Procedures Next Due Date:    Colonoscopy: 03/2015

## 2010-06-07 NOTE — Progress Notes (Signed)
Summary: Albuterol Inhaler  Phone Note Call from Patient Call back at Home Phone [phone]   Caller: Patient Call For: Newt Lukes MD Summary of Call: Pt requesting samples of "Allbuterol" inhalers. Initial call taken by: Verdell Face,  March 22, 2010 1:50 PM  Follow-up for Phone Call        Dr Felicity Coyer, we only have Xopenex inhalers available, is this an okay substitute for this pt? She willnot be able to afford medicine until the begining of December. Follow-up by: Margaret Pyle, CMA,  March 22, 2010 2:06 PM  Additional Follow-up for Phone Call Additional follow up Details #1::        ventolin sample given to traige B to give to pt - thanks Additional Follow-up by: Newt Lukes MD,  March 22, 2010 2:23 PM    Additional Follow-up for Phone Call Additional follow up Details #2::    Pt informed, Rx in cabinet for pt pick up Follow-up by: Margaret Pyle, CMA,  March 22, 2010 2:28 PM

## 2010-06-07 NOTE — Letter (Signed)
Summary: Pre Visit Letter Revised  [facility]  [address] [place], [place] [address]   Phone: [phone]  Fax: [phone]        01/17/2010 MRN: [id number] Wendy Carr [address] [place], [place]  [address]             Procedure Date:  Oct 24 at 10:30am   Welcome to the Gastroenterology Division at [facility].    You are scheduled to see a nurse for your pre-procedure visit on 02-13-10 at 4:30pm on the 3rd floor at [facility], [address]  We ask that you try to arrive at our office 15 minutes prior to your appointment time to allow for check-in.  Please take a minute to review the attached form.  If you answer "Yes" to one or more of the questions on the first page, we ask that you call the person listed at your earliest opportunity.  If you answer "No" to all of the questions, please complete the rest of the form and bring it to your appointment.    Your nurse visit will consist of discussing your medical and surgical history, your immediate family medical history, and your medications.   If you are unable to list all of your medications on the form, please bring the medication bottles to your appointment and we will list them.  We will need to be aware of both prescribed and over the counter drugs.  We will need to know exact dosage information as well.    Please be prepared to read and sign documents such as consent forms, a financial agreement, and acknowledgement forms.  If necessary, and with your consent, a friend or relative is welcome to sit-in on the nurse visit with you.  Please bring your insurance card so that we may make a copy of it.  If your insurance requires a referral to see a specialist, please bring your referral form from your primary care physician.  No co-pay is required for this nurse visit.     If you cannot keep your appointment, please call [phone] to cancel or reschedule prior to your appointment date.  This  allows [place] the opportunity to schedule an appointment for another patient in need of care.    Thank you for choosing [facility] for your medical needs.  We appreciate the opportunity to care for you.  Please visit [place] at our website  to learn more about our practice.  Sincerely, The Gastroenterology Division

## 2010-06-07 NOTE — Miscellaneous (Signed)
Summary: [facility] Previsit/prep  Clinical Lists Changes  Medications: Added new medication of MOVIPREP 100 GM  SOLR (PEG-KCL-NACL-NASULF-NA ASC-C) As per prep instructions. - Signed Rx of MOVIPREP 100 GM  SOLR (PEG-KCL-NACL-NASULF-NA ASC-C) As per prep instructions.;  #1 x 0;  Signed;  Entered by: Wyona Almas RN;  Authorized by: Mardella Layman MD [facility];  Method used: Electronically to [facility]*, [address] Shipping[address]mailing, [place], [place]  [address], Ph: [phone], Fax: [phone] Observations: Added new observation of ALLERGY REV: Done (03/13/2010 16:19)    Prescriptions: MOVIPREP 100 GM  SOLR (PEG-KCL-NACL-NASULF-NA ASC-C) As per prep instructions.  #1 x 0   Entered by:   Wyona Almas RN   Authorized by:   Mardella Layman MD [facility]   Signed by:   Wyona Almas RN on 03/13/2010   Method used:   Electronically to        [facility]* (retail)       [address]       [address]mail[address]       [place], [place]  [address]       Ph: [phone]       Fax: [phone]   RxID:   [id number]

## 2010-06-07 NOTE — Progress Notes (Signed)
Summary: samples  Phone Note Call from Patient   Summary of Call: Patient spouse called requesting samples of Qvar and Proair. I made him aware that we do not have any Proair, but the Qvar we have is . The patient uses . He would like to know if it is ok for the patient to use the 80 micrograms and he can pick it up when he gets off work from [employer] Please advise. Initial call taken by: Lucious Groves,  October 13, 2009 1:50 PM  Follow-up for Phone Call        yes, that is okay Follow-up by: Etta Grandchild MD,  October 13, 2009 2:09 PM  Additional Follow-up for Phone Call Additional follow up Details #1::        Patient spouse  notified. Additional Follow-up by: Lucious Groves,  October 13, 2009 2:28 PM

## 2010-06-07 NOTE — Letter (Signed)
Summary: [facility] Instructions  [facility]  [address] [place], [place] [address]   Phone: [phone]  Fax: [phone]       ANESSIA OAKLAND    07/27/1955    MRN: [id number]        Procedure Day /Date:  03/27/10  Monday     Arrival Time:  10:30am      Procedure Time:  11:30am     Location of Procedure:                    _ x_   [facility] (4th [facility])   PREPARATION FOR COLONOSCOPY WITH MOVIPREP   Starting 5 days prior to your procedure _11/16/11 _ do not eat nuts, seeds, popcorn, corn, beans, peas,  salads, or any raw vegetables.  Do not take any fiber supplements (e.g. Metamucil, Citrucel, and Benefiber).  THE DAY BEFORE YOUR PROCEDURE         DATE:  03/26/10   DAY:  Sunday  1.  Drink clear liquids the entire day-NO SOLID FOOD  2.  Do not drink anything colored red or purple.  Avoid juices with pulp.  No orange juice.  3.  Drink at least 64 oz. (8 glasses) of fluid/clear liquids during the day to prevent dehydration and help the prep work efficiently.  CLEAR LIQUIDS INCLUDE: Water Jello Ice Popsicles Tea (sugar ok, no milk/cream) Powdered fruit flavored drinks Coffee (sugar ok, no milk/cream) Gatorade Juice: apple, white grape, white cranberry  Lemonade Clear bullion, consomm, broth Carbonated beverages (any kind) Strained chicken noodle soup Hard Candy                             4.  In the morning, mix first dose of MoviPrep solution:    Empty 1 Pouch A and 1 Pouch B into the disposable container    Add lukewarm drinking water to the top line of the container. Mix to dissolve    Refrigerate (mixed solution should be used within 24 hrs)  5.  Begin drinking the prep at 5:00 p.m. The MoviPrep container is divided by 4 marks.   Every 15 minutes drink the solution down to the next mark (approximately 8 oz) until the full liter is complete.   6.  Follow completed prep with 16 oz of clear liquid of your choice (Nothing red or  purple).  Continue to drink clear liquids until bedtime.  7.  Before going to bed, mix second dose of MoviPrep solution:    Empty 1 Pouch A and 1 Pouch B into the disposable container    Add lukewarm drinking water to the top line of the container. Mix to dissolve    Refrigerate  THE DAY OF YOUR PROCEDURE      DATE:  03/27/10  DAY:  Monday  Beginning at  6:30 a.m. (5 hours before procedure):         1. Every 15 minutes, drink the solution down to the next mark (approx 8 oz) until the full liter is complete.  2. Follow completed prep with 16 oz. of clear liquid of your choice.    3. You may drink clear liquids until 9:30am  (2 HOURS BEFORE PROCEDURE).   MEDICATION INSTRUCTIONS  Unless otherwise instructed, you should take regular prescription medications with a small sip of water   as early as possible the morning of your procedure.  Additional medication instructions:   Hold Lisinopril/HCTZ  the morning of procedure.         OTHER INSTRUCTIONS  You will need a responsible adult at least 53 years of age to accompany you and drive you home.   This person must remain in the waiting room during your procedure.  Wear loose fitting clothing that is easily removed.  Leave jewelry and other valuables at home.  However, you may wish to bring a book to read or  an iPod/MP3 player to listen to music as you wait for your procedure to start.  Remove all body piercing jewelry and leave at home.  Total time from sign-in until discharge is approximately 2-3 hours.  You should go home directly after your procedure and rest.  You can resume normal activities the  day after your procedure.  The day of your procedure you should not:   Drive   Make legal decisions   Operate machinery   Drink alcohol   Return to work  You will receive specific instructions about eating, activities and medications before you leave.    The above instructions have been reviewed and explained  to me by   Wyona Almas RN  March 13, 2010 5:02 PM    I fully understand and can verbalize these instructions _____________________________ Date _________

## 2010-06-07 NOTE — Assessment & Plan Note (Signed)
Summary: TRANSFER FROM [facility]ON[facility] TO [facility]/OK'D BY BOTH/CD   Vital Signs:  Patient profile:   55 year old female Height:      62 inches (157.48 cm) Weight:      211.8 pounds (96.27 kg) BMI:     38.88 O2 Sat:      98 % on Room air Temp:     98.1 degrees F (36.72 degrees C) oral Pulse rate:   80 / minute BP sitting:   122 / 82  (right arm) Cuff size:   large  Vitals Entered By: Orlan Leavens RMA (December 08, 2009 11:05 AM)  O2 Flow:  Room air CC: New patient transferring from Dr. Yetta Barre Is Patient Diabetic? No Pain Assessment Patient in pain? no      Comments Pt want to discuss changing celexa to seroquel. also want samples of Qvar.    Primary Care Samnang Shugars:  Newt Lukes MD  CC:  New patient transferring from Dr. Yetta Barre.  History of Present Illness: new to me but known to our practice - txfr to me for ongoing care  1) depression/anxiety - has been on lexapro and celexa in past - ran out 3 - 4 months ago and inc in anxiety symptoms related to med discontinuation - would like to try seroquel - also needs refills on trazodone to help with sleep  2) c/o right foot and ankle pain - foot pain at heel  onset symptoms >4 months ago- pain worst when getting out of bed in AM - 1st steps "real bad" - increase n pain 3 days ago with twisting motion - now a/w swelling at bottom of foor and lateral ankle - no sig relief with ibuprofen or tylenol  chronic med issues reviewed  HTN - reports compliance with ongoing medical treatment and no changes in medication dose or frequency. denies adverse side effects related to current therapy.  no CP or edema  hypothyroid - reports compliance with ongoing medical treatment and no changes in medication dose or frequency. denies adverse side effects related to current therapy.   asthma - reports compliance with ongoing medical treatment and no changes in medication dose or frequency. denies adverse side effects related to current therapy.    needs CPX labs and refer to gyn + mammo   Preventive Screening-Counseling & Management  Alcohol-Tobacco     Alcohol drinks/day: <1     Alcohol Counseling: not indicated; patient does not drink     Smoking Status: current     Smoking Cessation Counseling: yes     Smoke Cessation Stage: precontemplative     Packs/Day: 0.75     Year Started: 1984     Pack years: 29  Caffeine-Diet-Exercise     Does Patient Exercise: no     Exercise Counseling: to improve exercise regimen     Depression Counseling: further diagnostic testing and/or other treatment is indicated  Safety-Violence-Falls     Firearms in the Home: no firearms in the home     Smoke Detectors: yes     Violence in the Home: no risk noted     Sexual Abuse: no     Fall Risk Counseling: not indicated; no significant falls noted  Clinical Review Panels:  Immunizations   Last Tetanus Booster:  Historical (05/07/2006)  Diabetes Management   HgBA1C:  6.1 (09/03/2008)   Creatinine:  0.7 (08/20/2008)  CBC   WBC:  12.7 (09/03/2008)   RBC:  5.06 (09/03/2008)   Hgb:  13.8 (09/03/2008)  Hct:  41.2 (09/03/2008)   Platelets:  35.0 (09/03/2008)   MCV  81.5 (09/03/2008)   MCHC  33.5 (09/03/2008)   RDW  17.5 H % (09/03/2008)   PMN:  80.7 (09/03/2008)   Lymphs:  11.4 (09/03/2008)   Monos:  0.6 (09/03/2008)   Eosinophils:  5.1 (09/03/2008)   Basophil:  2.2 (09/03/2008)  Complete Metabolic Panel   Glucose:  100 (08/20/2008)   Sodium:  141 (08/20/2008)   Potassium:  4.5 (08/20/2008)   Chloride:  109 (08/20/2008)   CO2:  28 (08/20/2008)   BUN:  9 (08/20/2008)   Creatinine:  0.7 (08/20/2008)   Albumin:  4.0 (09/03/2008)   Total Protein:  8.1 (09/03/2008)   Calcium:  9.4 (08/20/2008)   Total Bili:  0.6 (09/03/2008)   Alk Phos:  123 (09/03/2008)   SGPT (ALT):  40 (09/03/2008)   SGOT (AST):  32 (09/03/2008)   Current Medications (verified): 1)  Lisinopril-Hydrochlorothiazide 10-12.5 Mg Tabs  (Lisinopril-Hydrochlorothiazide) .... Take 1 Tablet By Mouth Once A Day 2)  Levothyroxine Sodium 100 Mcg Tabs (Levothyroxine Sodium) .... Take 1 Tablet By Mouth Once A Day 3)  Buspar 10 Mg Tabs (Buspirone Hcl) .... One By Mouth Tid 4)  Celexa 40 Mg Tabs (Citalopram Hydrobromide) .... Once Daily 5)  Proair Hfa 108 (90 Base) Mcg/act Aers (Albuterol Sulfate) .Marland Kitchen.. 1-2 Puffs Qid As Needed For Wheezing 6)  Trazodone Hcl 100 Mg Tabs (Trazodone Hcl) .... 1/2 - 1 By Mouth At Bedtime As Needed For Insomnia 7)  Clarinex-D 12 Hour 2.5-120 Mg Xr12h-Tab (Desloratadine-Pseudoephedrine) .... One By Mouth Two Times A Day As Needed For Congestion 8)  Qvar 40 Mcg/act Aers (Beclomethasone Dipropionate) .... One Puff Two Times A Day Every Day For Asthma 9)  Nasonex 50 Mcg/act Susp (Mometasone Furoate) .... 2 Puffs Each Nostril Once Daily 10)  Ultram 50 Mg Tabs (Tramadol Hcl) .Marland Kitchen.. 1-2 By Mouth Q 6 Hours As Needed For Pain  Allergies (verified): 1)  ! 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 I have reviewed all other systems and they were negative.   Physical Exam  General:  overweight-appearing.  alert, well-developed, well-nourished, and cooperative to examination.    Lungs:  normal respiratory effort, no intercostal retractions or use of  accessory muscles; normal breath sounds bilaterally - no crackles and no wheezes.    Heart:  normal rate, regular rhythm, no murmur, and no rub. BLE without edema. normal DP pulses and normal cap refill in all 4 extremities    Msk:  right plantar fascia tender to palp and mildly swollen - ankle FROM w/o pain but tender to palp over lateral side Psych:  Oriented X3, memory intact for recent and remote, normally interactive, and slightly anxious.     Impression & Recommendations:  Problem # 1:  ANXIETY (ICD-300.00)  resume meds and tx with  seroquel for additive tx to help with depression component - new erx done will f/u on meds and symptoms next OV (few weeks) Her updated medication list for this problem includes:    Buspar 10 Mg Tabs (Buspirone hcl) ..... One by mouth tid    Celexa 40 Mg Tabs (Citalopram hydrobromide) ..... Once daily    Trazodone Hcl 100 Mg Tabs (Trazodone hcl) .Marland Kitchen... 1/2 - 1 by mouth at bedtime as needed for insomnia  Orders: Prescription Created Electronically [phone])  Problem # 2:  PLANTAR FASCIITIS, RIGHT (ICD-728.71)  tx voltaren gel - eval for ankle injury too - see next  Orders: Prescription Created Electronically [id number])  Problem # 3:  ANKLE PAIN, RIGHT (ICD-719.47) twisted d/t "walking funny" with plantar fasc pain -- voltaren gel as for PF if no fx on xray (addenedum - xray NAD) Orders: T-Ankle Comp Right (62130QM) Prescription Created Electronically [id number])  Problem # 4:  ASTHMA (ICD-493.90)  Her updated medication list for this problem includes:    Proair Hfa 108 (90 Base) Mcg/act Aers (Albuterol sulfate) .Marland Kitchen... 1-2 puffs qid as needed for wheezing    Qvar 40 Mcg/act Aers (Beclomethasone dipropionate) ..... One puff two times a day every day for asthma  Pulmonary Functions Reviewed: O2 sat: 98 (12/08/2009)  Problem # 5:  HYPOTHYROIDISM (ICD-244.9) need to check TSH next OV - to return for CPX soon - refer then also for gyn/mammo,etc Her updated  medication list for this problem includes:    Levothyroxine Sodium 100 Mcg Tabs (Levothyroxine sodium) .Marland Kitchen... Take 1 tablet by mouth once a day  Labs Reviewed: TSH: 0.44 (09/03/2008)    HgBA1c: 6.1 (09/03/2008)  Problem # 6:  HYPERTENSION (ICD-401.9)  Her updated medication list for this problem includes:    Lisinopril-hydrochlorothiazide 10-12.5 Mg Tabs (Lisinopril-hydrochlorothiazide) .Marland Kitchen... Take 1 tablet by mouth once a day  BP today: 122/82 Prior BP: 126/80 (09/03/2008)  Prior 10 Yr Risk Heart Disease: Not enough information (08/20/2008)  Labs Reviewed: K+: 4.5 (08/20/2008) Creat: : 0.7 (08/20/2008)     Time spent with patient 40 minutes, more than 50% of this time was spent counseling patient on depression and anxiety,  problems concerning right foot and ankle pain and need to schedule for CPX to do labs and other health maintence eval/tx  Complete Medication List: 1)  Lisinopril-hydrochlorothiazide 10-12.5 Mg Tabs (Lisinopril-hydrochlorothiazide) .... Take 1 tablet by mouth once a day 2)  Levothyroxine Sodium 100 Mcg Tabs (Levothyroxine sodium) .... Take 1 tablet by mouth once a day 3)  Buspar 10 Mg Tabs (Buspirone hcl) .... One by mouth tid 4)  Celexa 40 Mg Tabs (Citalopram hydrobromide) .... Once daily 5)  Proair Hfa 108 (90 Base) Mcg/act Aers (Albuterol sulfate) .Marland Kitchen.. 1-2 puffs qid as needed for wheezing 6)  Trazodone Hcl 100 Mg Tabs (Trazodone hcl) .... 1/2 - 1 by mouth at bedtime as needed for insomnia 7)  Clarinex-d 12 Hour 2.5-120 Mg Xr12h-tab (Desloratadine-pseudoephedrine) .... One by mouth two times a day as needed for congestion 8)  Qvar 40 Mcg/act Aers (Beclomethasone dipropionate) .... One puff two times a day every day for asthma 9)  Nasonex 50 Mcg/act Susp (Mometasone furoate) .... 2 puffs each nostril once daily 10)  Ultram 50 Mg Tabs (Tramadol hcl) .Marland Kitchen.. 1-2 by mouth q 6 hours as needed for pain 11)  Seroquel Xr 50 Mg Xr24h-tab (Quetiapine fumarate) .Marland Kitchen.. 1 by  mouth once daily 12)  Voltaren 1 % Gel (Diclofenac sodium) .... Apply to painful area on foot three times  a day x 5 days, then as needed  Patient Instructions: 1)  it was good to see you today. 2)  medications and history reviewed - 3)  resume celexa and use with seroquel for anxiety and depression symptoms - 4)  xray of right ankle - your results will be posted on the phone tree for review in 48-72 hours from the time of test completion; call [phone] and enter your 9 digit MRN (listed above on this page, just below your name); if any changes need to be made or there are abnormal results, you will be contacted directly.  5)  use voltaren gel for pain - 6)  your new prescriptions and all refills for all medications have been electronically submitted to your pharmacy. Please take as directed. Contact our office if you believe you're having problems with the medication(s).  7)  Please schedule a follow-up appointment for "physical and labs" in next few weeks to continue review, call sooner if problems.  Prescriptions: ULTRAM 50 MG TABS (TRAMADOL HCL) 1-2 by mouth q 6 hours as needed for pain  #60 x 2   Entered and Authorized by:   Newt Lukes MD   Signed by:   Newt Lukes MD on 12/08/2009   Method used:   Electronically to        [facility]* (retail)       [address]       [address] Shipping/mailing       [place], [place]  [address]       Ph: [phone]       Fax: [phone]   RxID:   [id number] NASONEX 50 MCG/ACT SUSP (MOMETASONE FUROATE) 2 puffs each nostril once daily  #1 x 3   Entered and Authorized by:   Newt Lukes MD   Signed by:   Newt Lukes MD on 12/08/2009   Method used:   Electronically to        [facility]* (retail)       [address]       [address] Shipping/mailing       [place], [place]  [address]       Ph: [phone]       Fax: [phone]   RxID:   [id number] QVAR 40  MCG/ACT AERS (BECLOMETHASONE DIPROPIONATE) One puff two times a day every day for asthma  #1 x 3   Entered and Authorized by:   Newt Lukes MD   Signed by:   Newt Lukes MD on 12/08/2009   Method used:   Electronically to        [facility]* (retail)       [address]       [address] Shipping/mailing       [place], [place]  [address]       Ph: [phone]       Fax: [phone]   RxID:   [id number] TRAZODONE HCL 100 MG TABS (TRAZODONE HCL) 1/2 - 1 by mouth at bedtime as needed for insomnia  #30 Tablet x 11   Entered and Authorized by:   Newt Lukes MD   Signed by:   Newt Lukes MD on 12/08/2009   Method used:   Electronically to        [facility]* (retail)       [address]  [address] Shipping/mailing       [place], [place]  [address]       Ph: [phone]       Fax: [phone]   RxID:   [id number] BUSPAR 10 MG TABS (BUSPIRONE HCL) One by mouth TID  #90 x 11   Entered and Authorized by:   Newt Lukes MD   Signed by:   Newt Lukes MD on 12/08/2009   Method used:   Electronically to        [facility]* (retail)       [address]       [address] Shipping/mailing       [place], [place]  [address]       Ph: [phone]       Fax: [phone]   RxID:   [id number] LEVOTHYROXINE SODIUM 100 MCG TABS (LEVOTHYROXINE SODIUM) Take 1 tablet by mouth once a day  #30 x 11   Entered and Authorized by:   Newt Lukes MD   Signed by:   Newt Lukes MD on 12/08/2009   Method used:   Electronically to        [facility]* (retail)       [address]       [address] Shipping/mailing       [place], [place]  [address]       Ph: [phone]       Fax: [phone]   RxID:   [id number] LISINOPRIL-HYDROCHLOROTHIAZIDE 10-12.5 MG TABS (LISINOPRIL-HYDROCHLOROTHIAZIDE) Take 1 tablet by mouth once a day  #30 Tablet x 11    Entered and Authorized by:   Newt Lukes MD   Signed by:   Newt Lukes MD on 12/08/2009   Method used:   Electronically to        [facility]* (retail)       [address]       [address] Shipping/mailing       [place], [place]  [address]       Ph: [phone]       Fax: [phone]   RxID:   [id number] VOLTAREN 1 % GEL (DICLOFENAC SODIUM) apply to painful area on foot three times a day x 5 days, then as needed  #1 x 2   Entered and Authorized by:   Newt Lukes MD   Signed by:   Newt Lukes MD on 12/08/2009   Method used:   Electronically to        [facility]* (retail)       [address]       [address] Shipping/mailing       [place], [place]  [address]       Ph: [phone]       Fax: [phone]   RxID:   [id number] SEROQUEL XR 50 MG XR24H-TAB (QUETIAPINE FUMARATE) 1 by mouth once daily  #30 x 3   Entered and Authorized by:   Newt Lukes MD   Signed by:   Newt Lukes MD on 12/08/2009   Method used:   Electronically to        [facility]* (retail)       [address]       [address] Shipping/mailing       [place], [place]  [address]  Ph: [phone]       Fax: [phone]   RxID:   [id number] CELEXA 40 MG TABS (CITALOPRAM HYDROBROMIDE) once daily  #30 x 11   Entered and Authorized by:   Newt Lukes MD   Signed by:   Newt Lukes MD on 12/08/2009   Method used:   Electronically to        [facility]* (retail)       [address]       [address] Shipping/mailing       [place], [place]  [address]       Ph: [phone]       Fax: [phone]   RxID:   [id number]

## 2010-06-07 NOTE — Letter (Signed)
Summary: Patient Notice- Polyp Results  [facility]  [address] [place], [place] [address]   Phone: [phone]  Fax: [phone]        March 29, 2010 MRN: [id number]    Wendy Carr [address] [place], [place]  [address]    Dear [name],  I am pleased to inform you that the colon polyp(s) removed during your recent colonoscopy was (were) found to be benign (no cancer detected) upon pathologic examination.  I recommend you have a repeat colonoscopy examination in 5_ years to look for recurrent polyps, as having colon polyps increases your risk for having recurrent polyps or even colon cancer in the future.YOUR POLYPS WERE HYPERPLASTIC POLYPS.BECAUSE OF THE MULTIPLE POLYPS AND YOUR YOUNG AGE,I WOULDD RECOMMEND FOLLOWUP IN 5 YEARS.  Should you develop new or worsening symptoms of abdominal pain, bowel habit changes or bleeding from the rectum or bowels, please schedule an evaluation with either your primary care physician or with me.  Additional information/recommendations:  _X_ No further action with gastroenterology is needed at this time. Please      follow-up with your primary care physician for your other healthcare      needs.  __ Please call [phone] to schedule a return visit to review your      situation.  __ Please keep your follow-up visit as already scheduled.  __ Continue treatment plan as outlined the day of your exam.  Please call us if you are having persistent problems or have questions about your condition that have not been fully answered at this time.  Sincerely,  Mardella Layman MD [facility]  This letter has been electronically signed by your physician.  Appended Document: Patient Notice- Polyp Results Letter mailed

## 2010-06-16 ENCOUNTER — Telehealth: Payer: Self-pay | Admitting: Internal Medicine

## 2010-06-22 NOTE — Progress Notes (Signed)
Summary: nasonex change to flonase  Phone Note From Pharmacy   Caller: [facility]* Call For: Dr. Felicity Coyer  Summary of Call: Recieved fax stating can pt switch to flonase instead of nasonex. It would be a generic copay for pt. also need refills on ventolin, tramadol. Pls advise Initial call taken by: Orlan Leavens RMA,  June 16, 2010 1:01 PM  Follow-up for Phone Call        ok for refills - nasal steroid changed as requested - erx flonase done Follow-up by: Newt Lukes MD,  June 16, 2010 2:16 PM    New/Updated Medications: FLONASE 50 MCG/ACT SUSP (FLUTICASONE PROPIONATE) 2 sprays each nostril  every morning Prescriptions: FLONASE 50 MCG/ACT SUSP (FLUTICASONE PROPIONATE) 2 sprays each nostril  every morning  #1 x 3   Entered and Authorized by:   Newt Lukes MD   Signed by:   Newt Lukes MD on 06/16/2010   Method used:   Electronically to        [facility]* (retail)       [address]       [address] Shipping/mailing       [place], [place]  [address]       Ph: [phone]       Fax: [phone]   RxID:   [id number] PROAIR HFA 108 (90 BASE) MCG/ACT AERS (ALBUTEROL SULFATE) 1-2 puffs QID as needed for wheezing  #1 inh x 1   Entered by:   Orlan Leavens RMA   Authorized by:   Newt Lukes MD   Signed by:   Orlan Leavens RMA on 06/16/2010   Method used:   Electronically to        [facility]* (retail)       [address]       [address] Shipping/mailing       [place], [place]  [address]       Ph: [phone]       Fax: [phone]   RxID:   [id number] ULTRAM 50 MG TABS (TRAMADOL HCL) 1-2 by mouth q 6 hours as needed for pain  #60 Tablet x 1   Entered by:   Orlan Leavens RMA   Authorized by:   Newt Lukes MD   Signed by:   Orlan Leavens RMA on 06/16/2010   Method used:   Electronically to        [facility]* (retail)       [address]       [address] Shipping/mailing       [place], [place]  [address]       Ph: [phone]       Fax: [phone]   RxID:   [id number]

## 2010-07-10 ENCOUNTER — Inpatient Hospital Stay (INDEPENDENT_AMBULATORY_CARE_PROVIDER_SITE_OTHER)
Admission: RE | Admit: 2010-07-10 | Discharge: 2010-07-10 | Disposition: A | Discharge status: Home / Self Care | Payer: Commercial Managed Care - PPO | Source: Ambulatory Visit | Attending: Family Medicine | Admitting: Family Medicine

## 2010-07-10 DIAGNOSIS — K089 Disorder of teeth and supporting structures, unspecified: Secondary | ICD-10-CM

## 2010-07-24 LAB — CULTURE, ROUTINE-ABSCESS

## 2010-07-26 LAB — COMPREHENSIVE METABOLIC PANEL
ALT: 38 U/L — ABNORMAL HIGH (ref 0–35)
Alkaline Phosphatase: 101 U/L (ref 39–117)
BUN: 12 mg/dL (ref 6–23)
Chloride: 105 mEq/L (ref 96–112)
Glucose, Bld: 99 mg/dL (ref 70–99)
Potassium: 3.7 mEq/L (ref 3.5–5.1)
Sodium: 137 mEq/L (ref 135–145)
Total Bilirubin: 0.4 mg/dL (ref 0.3–1.2)
Total Protein: 7.3 g/dL (ref 6.0–8.3)

## 2010-07-26 LAB — POCT URINALYSIS DIP (DEVICE)
Bilirubin Urine: NEGATIVE
Glucose, UA: NEGATIVE mg/dL
Ketones, ur: NEGATIVE mg/dL
Nitrite: NEGATIVE
pH: 7 (ref 5.0–8.0)

## 2010-07-26 LAB — CBC
HCT: 34.6 % — ABNORMAL LOW (ref 36.0–46.0)
Hemoglobin: 11.1 g/dL — ABNORMAL LOW (ref 12.0–15.0)
RBC: 4.25 MIL/uL (ref 3.87–5.11)
RDW: 18.1 % — ABNORMAL HIGH (ref 11.5–15.5)
WBC: 6.8 10*3/uL (ref 4.0–10.5)

## 2010-07-26 LAB — POCT PREGNANCY, URINE: Preg Test, Ur: NEGATIVE

## 2010-07-26 LAB — LIPASE, BLOOD: Lipase: 33 U/L (ref 11–59)

## 2010-08-08 ENCOUNTER — Other Ambulatory Visit: Payer: Self-pay | Admitting: Internal Medicine

## 2010-08-17 LAB — POCT I-STAT, CHEM 8
BUN: 7 mg/dL (ref 6–23)
Calcium, Ion: 1.17 mmol/L (ref 1.12–1.32)
Chloride: 107 mEq/L (ref 96–112)
Glucose, Bld: 110 mg/dL — ABNORMAL HIGH (ref 70–99)
TCO2: 25 mmol/L (ref 0–100)

## 2010-09-19 NOTE — Discharge Summary (Signed)
[name], Carr              ACCOUNT NO.:  [id number]   MEDICAL RECORD NO.:  [id number]          PATIENT TYPE:  INP   LOCATION:  1511                         FACILITY:  [facility]   PHYSICIAN:  Mobolaji B. Bakare, M.D.DATE OF BIRTH:  01-24-1956   DATE OF ADMISSION:  10/30/2007  DATE OF DISCHARGE:  11/04/2007                               DISCHARGE SUMMARY   PRIMARY CARE PHYSICIAN:  Unassigned ([facility]   FINAL DIAGNOSES:  1. Acute asthma exacerbation.  2. Influenza like symptoms.  3. Hyperglycemia secondary to steroid.  4. Thrombocytopenia secondary to viral illness.  5. Mild anemia.  6. Obesity.  7. Mildly elevated ALT.   SECONDARY DIAGNOSIS:  Hypothyroidism.   PROCEDURE:  1. Chest x-ray done on October 30, 2007, showed worsening aeration with      decreasing lung volumes and mild bibasilar atelectasis.  2.  Chest      x-ray done on October 28, 2007, in the emergency room prior to      hospitalization showed low lung volumes.  No acute disease.   BRIEF HISTORY:  Please refer to the admission H and P for full details,  In brief, Wendy Carr is a 55 year old African American female with  history of asthma.  She tells me that she gets asthma exacerbation about  once a year.  She presented initially to the emergency room on October 28, 2007.  She was treated with nebs and sent home on steroid taper.  She  came back on October 30, 2007, with worsening symptoms.  She had associated  shortness of breath, wheezing, she ached all over, she has been coughing  up yellowish sputum, she was afebrile and in audible wheezes.  The  patient was admitted for further management.   HOSPITAL COURSE:  1. Asthma exacerbation.  The patient had a slow response.  She was on      IV steroid for the whole course of hospitalization; albuterol nebs,      antibiotics.  The patient improved.  At the time of discharge, she      had minimal rhonchi.  She was not short of breath.  She was stable      enough for  discharge.  PEEK flow at the time of discharge is      pending.  The patient has asthma attack once a year.  She may not      require a scheduled inhaled steroid at this time.  Further re-      evaluation primary care physician.  2. Hypertension.  The patient was noted to have persistently elevated      blood pressure in the last 24-36 hours during the course of      hospitalization.  She has been started on hydrochlorothiazide and      potassium supplement.  3. Influenza-like syndrome.  The patient was started empirically on      treatment for H1N1 with Tamiflu.  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  4. Mild thrombocytopenia.  The patient developed low platelets during  the course of hospitalization and normal platelets ranged between      130-150 per E-chart record in May 2009.  Platelets dropped down to      143 during the course of hospitalization and this has remained      stable.  There is no bleeding.  She had also has mild anemia which      is felt to be hemodilution.  Hemoglobin has normalized prior to      discharge with hemoglobin of 12.1.  5. Transaminitis.  The patient had transient transaminitis during the      course of hospitalization.  She was admitted with an ALT of 38,      normal alkaline phosphatase, AST and bilirubin.  These remained      stable during the course of hospitalization.  No further workup was      pursued.  This can be monitored closely with her primary care      physician.  It should be mentioned that she is obese.  6. Hypothyroidism.  The patient was continued on Synthroid during the      course of hospitalization.   DISCHARGE MEDICATIONS:  1. Prednisone 60 mg taper by 10 mg every two days until 30 mg daily,      then taper by 10 mg every three days until finished.  2. Avelox 400 mg p.o. daily times two more days.  3. Tamiflu 75 mg p.o. b.i.d. times two more doses.  4. Tessalon Perles 200 mg t.i.d. p.r.n.   5. Tussionex 5 mL p.o. b.i.d. times four more doses.  6. Ativan 4.5 mg p.r.n.  7. Trazodone 1 p.o. at bedtime p.r.n.  8. Albuterol 4 times a day and every two hours p.r.n. for one week      then continue p.r.n.  9. Synthroid to use as before.  10.Celexa 20 mg daily.  11.Robaxin p.r.n.  12.Hydrochlorothiazide 25 mg daily.  13.Potassium supplement 10 mEq daily.  Times, is under the albuterol p.r.n. changed her to albuterol .Marland Carr  And  also include under the day of the course of acute asthma exacerbation.  See   DISPOSITION:  Home.  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   DISCHARGE LABORATORY DATA:  White cells 7.7, hemoglobin 12.1, hematocrit  36.6, platelets 144, blood cultures negative, AST 35, ALT 42.      Mobolaji B. Corky Downs, M.D.  Electronically Signed     MBB/MEDQ  D:  11/04/2007  T:  11/04/2007  Job:  [id number]

## 2010-09-19 NOTE — H&P (Signed)
NAME:  Wendy Carr, Wendy Carr NO.:  [id number]   MEDICAL RECORD NO.:  [id number]          PATIENT TYPE:  INP   LOCATION:  0105                         FACILITY:  [facility]   PHYSICIAN:  Lonia Blood, M.D.       DATE OF BIRTH:  1955/08/04   DATE OF ADMISSION:  10/30/2007  DATE OF DISCHARGE:                              HISTORY & PHYSICAL   PRIMARY CARE PHYSICIAN:  [facility].   CHIEF COMPLAINT:  Shortness of breath.   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  She was seen in  the emergency room the night prior to admission, and there was treated  with steroids, nebulizers and sent home.  The patient reports that now  she is having more difficulty breathing and she is sore all over her  body.  She has a sore throat but does not have high fever.  She does not  know if she has come in contact with someone with influenza.  The  patient reports that she has been coughing up yellowish-like phlegm.   PAST MEDICAL HISTORY:  Asthma, hypothyroidism and thyroid surgery.   HOME MEDICATIONS:  1. Synthroid 25 mcg daily.  2. Albuterol MDI as needed.   SOCIAL HISTORY:  The patient works in a day care.  She smokes a  cigarette a day. Does not drink alcohol and no drugs.   FAMILY HISTORY:  Positive for hypertension.   REVIEW OF SYSTEMS:  As per HPI.  All other systems are negative.   PHYSICAL EXAMINATION UPON ADMISSION:  Temperature 98.6, blood pressure  150/89, pulse 95, respirations 27, saturation 97% on 2 liters of oxygen.  GENERAL APPEARANCE:  An obese woman, moderately ill.  Alert and oriented  to place, person and time with audible wheezes from the door.  HEENT:  HEAD:  Normocephalic, atraumatic.  EYES:  Pupils equal and  round; react to accommodation.  Extraocular movements intact.  THROAT:  Bilateral  erythema of the tonsils without any exudate.  NECK:  Supple, no JVD.  CHEST:  Bilateral wheezes; no rhonchi or crackles.  HEART:  Regular rate and rhythm; without murmurs, rubs or gallops.  ABDOMEN:  Soft, nontender and nondistended.  Bowel sounds present.  LOWER EXTREMITIES:  There is +1 edema.  SKIN:  Warm and dry without any suspicious looking rashes.  NEUROLOGICAL EXAM:  Cranial nerves III-XII are intact.  Strength 5/5 in  all 4 extremities.  Sensation intact.   LABORATORY VALUES ON ADMISSION:  White blood cell count 6, hemoglobin  11, platelet count 151.  Sodium 140, potassium 3.8, chloride 105, BUN  13, creatinine 0.9, glucose 115.   EKG:  Normal sinus rhythm without any ST-T changes.   CHEST X-RAY:  Shows atelectasis.   ASSESSMENT/PLAN:  Asthma exacerbation.  Brought on by a probable viral  infection.  This patient might have symptoms consistent with influenza,  although she does not have the fever that  goes along with it. I will  admit Wendy Carr.  Will place her on isolation.  Will start her on  steroids, antibiotics and empiric Tamiflu.  Based on her clinical  course, we will test for the novel H1N1 influenza strain.      Lonia Blood, M.D.  Electronically Signed     SL/MEDQ  D:  10/30/2007  T:  10/30/2007  Job:  [id number]

## 2010-09-22 NOTE — Discharge Summary (Signed)
NAME:  CHRISTINE, MORTON NO.:  [id number]   MEDICAL RECORD NO.:  [id number]           PATIENT TYPE:   LOCATION:                                 FACILITY:   PHYSICIAN:  Eduard Clos, MD     DATE OF BIRTH:   DATE OF ADMISSION:  DATE OF DISCHARGE:                               DISCHARGE SUMMARY   ADDENDUM:  Infectious has notified me that patient was positive for  H1N1.  I did call the patient and discuss with her through the phone at  [phone], stating that she take the full course of Tamiflu, which  was prescribed for her.  Patient did verbalize understanding and stated  that she will be taking the full course, as advised.  Also advised to be  on isolation for another 3-4 days more.  If there is any worsening  symptoms, to come to the ER immediately.      Eduard Clos, MD  Electronically Signed     ANK/MEDQ  D:  11/05/2007  T:  11/05/2007  Job:  [id number]

## 2010-09-22 NOTE — Discharge Summary (Signed)
NAME:  Wendy Carr, Wendy Carr              ACCOUNT NO.:  [id number]   MEDICAL RECORD NO.:  [id number]          PATIENT TYPE:  INP   LOCATION:                               FACILITY:  [facility]   PHYSICIAN:  Mobolaji B. Bakare, M.D.DATE OF BIRTH:  1956-01-24   DATE OF ADMISSION:  10/30/2007  DATE OF DISCHARGE:                               DISCHARGE SUMMARY   ADDENDUM:  This an addendum to previous discharge summary.   PRIMARY CARE PHYSICIAN:  Unassigned ([facility]).   ADDITIONAL DIAGNOSIS:  H1N1 swine flu RNA detected.   As noted in the previous discharge summary the patient had influenza  like syndrome.  She was treated with Tamiflu for 5 days.  H1N1 nasal  swab was unavailable at the time of discharge.  As I was informed post  discharge that the influenza A swine RNA was detected, influenza A swine  H1N1 RNA detected and influenza A RNA detected, I have made attempt to  contact the patient to inform her of this new information.  She is  currently unavailable.  I will continue to pursue contacting her.  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  It should be noted that the patient's  symptoms had resolved at the time of discharge.  She was afebrile.  There were no more body aches.  However she still had hacking cough.  I  will continue to attempt contacting the patient and give this new  information.   Contact was eventually made with patient and she was informed of the  diagnosis.      Mobolaji B. Corky Downs, M.D.  Electronically Signed     MBB/MEDQ  D:  11/21/2007  T:  11/22/2007  Job:  [id number]

## 2010-10-11 ENCOUNTER — Other Ambulatory Visit: Payer: Self-pay | Admitting: *Deleted

## 2010-10-11 MED ORDER — TRAMADOL HCL 50 MG PO TABS: 50.0000 mg | ORAL_TABLET | Freq: Four times a day (QID) | ORAL | Status: DC | PRN

## 2010-10-17 ENCOUNTER — Inpatient Hospital Stay (INDEPENDENT_AMBULATORY_CARE_PROVIDER_SITE_OTHER)
Admission: RE | Admit: 2010-10-17 | Discharge: 2010-10-17 | Disposition: A | Discharge status: Home / Self Care | Payer: 59 | Source: Ambulatory Visit | Attending: Emergency Medicine | Admitting: Emergency Medicine

## 2010-10-17 DIAGNOSIS — M62838 Other muscle spasm: Secondary | ICD-10-CM

## 2010-10-28 ENCOUNTER — Emergency Department (HOSPITAL_COMMUNITY)
Admission: EM | Admit: 2010-10-28 | Discharge: 2010-10-28 | Disposition: A | Discharge status: Home / Self Care | Payer: 59 | Attending: Emergency Medicine | Admitting: Emergency Medicine

## 2010-10-28 ENCOUNTER — Emergency Department (HOSPITAL_COMMUNITY): Payer: 59

## 2010-10-28 ENCOUNTER — Encounter (HOSPITAL_COMMUNITY): Payer: Self-pay | Admitting: Radiology

## 2010-10-28 DIAGNOSIS — R32 Unspecified urinary incontinence: Secondary | ICD-10-CM | POA: Insufficient documentation

## 2010-10-28 DIAGNOSIS — M542 Cervicalgia: Secondary | ICD-10-CM | POA: Insufficient documentation

## 2010-10-28 DIAGNOSIS — J45909 Unspecified asthma, uncomplicated: Secondary | ICD-10-CM | POA: Insufficient documentation

## 2010-10-28 DIAGNOSIS — M545 Low back pain, unspecified: Secondary | ICD-10-CM | POA: Insufficient documentation

## 2010-10-28 DIAGNOSIS — I10 Essential (primary) hypertension: Secondary | ICD-10-CM | POA: Insufficient documentation

## 2010-10-28 DIAGNOSIS — Z79899 Other long term (current) drug therapy: Secondary | ICD-10-CM | POA: Insufficient documentation

## 2010-10-28 DIAGNOSIS — M79609 Pain in unspecified limb: Secondary | ICD-10-CM | POA: Insufficient documentation

## 2010-10-28 DIAGNOSIS — R221 Localized swelling, mass and lump, neck: Secondary | ICD-10-CM | POA: Insufficient documentation

## 2010-10-28 DIAGNOSIS — S335XXA Sprain of ligaments of lumbar spine, initial encounter: Secondary | ICD-10-CM | POA: Insufficient documentation

## 2010-10-28 DIAGNOSIS — R220 Localized swelling, mass and lump, head: Secondary | ICD-10-CM | POA: Insufficient documentation

## 2010-10-28 LAB — URINALYSIS, ROUTINE W REFLEX MICROSCOPIC
Bilirubin Urine: NEGATIVE
Glucose, UA: NEGATIVE mg/dL
Hgb urine dipstick: NEGATIVE
Ketones, ur: NEGATIVE mg/dL
Protein, ur: NEGATIVE mg/dL
Urobilinogen, UA: 1 mg/dL (ref 0.0–1.0)

## 2010-10-28 LAB — POCT I-STAT, CHEM 8
BUN: 19 mg/dL (ref 6–23)
Calcium, Ion: 1.1 mmol/L — ABNORMAL LOW (ref 1.12–1.32)
Creatinine, Ser: 0.9 mg/dL (ref 0.50–1.10)
Hemoglobin: 12.9 g/dL (ref 12.0–15.0)
TCO2: 23 mmol/L (ref 0–100)

## 2010-10-28 MED ORDER — IOHEXOL 300 MG/ML  SOLN: 100.0000 mL | Freq: Once | INTRAMUSCULAR | Status: DC | PRN

## 2010-10-28 MED ORDER — IOHEXOL 300 MG/ML  SOLN
100.0000 mL | Freq: Once | INTRAMUSCULAR | Status: AC | PRN
  Administered 2010-10-28: 80 mL via INTRAVENOUS

## 2010-12-13 ENCOUNTER — Other Ambulatory Visit: Payer: Self-pay | Admitting: Internal Medicine

## 2010-12-13 NOTE — Telephone Encounter (Signed)
   Karenann Cai - 9:51 AM More Detail >>      Rx Auth Request      Surescripts In Interface        Sent: Wed December 13, 2010  9:51 AM    To: Rubie Maid Rx Refill          Message     The demographic information from the pharmacy is: Patient Name: Wendy Carr, Wendy Carr Patient DOB: 04-19-1956 Patient Gender: Female Address:    [address]    [place]    [address]      Karenann Cai  55 y.o. / Female (06/21/1955)  Pharmacy: [facility] - [place], [place] - [address] Ph: [phone]   MRN: [id number]  PCP: Rene Paci, MD  Wt: 214 lb 12.8 oz (97.433 kg) (01/13/2010)   Home: [phone]  Work: [phone]  Mobile: [phone]       Requested Medications     traMADol (ULTRAM) 50 MG tablet [Pharmacy Med Name: TRAMADOL HCL 50 MG TABLET TAB 50 MG]    TAKE 1 TABLET (50 MG TOTAL) BY MOUTH EVERY 6 (SIX) HOURS AS NEEDED FOR PAIN.    Disp: 60 tablet R: 1 Start: 12/13/2010 Class: Normal    Originally ordered on: 08/08/2010 Last refill: 11/13/2010 Order History    traZODone (DESYREL) 100 MG tablet [Pharmacy Med Name: TRAZODONE 100 MG TABLET TAB 100 MG]    TAKE 1/2 TO 1 TABLET BY MOUTH AT BEDTIME AS NEEDED FOR INSOMNIA    Disp: 30 tablet R: 11 Start: 12/13/2010 Class: Normal    Last refill: 11/13/2010    busPIRone (BUSPAR) 10 MG tablet [Pharmacy Med Name: BUSPIRONE HCL 10 MG TABLET TAB 10 M]    TAKE 1 TABLET BY MOUTH 3 TIMES DAILY    Disp: 90 tablet R: 11 Start: 12/13/2010 Class: Normal    Last refill: 10/11/2010    citalopram (CELEXA) 40 MG tablet [Pharmacy Med Name: CITALOPRAM HBR 40 MG TABLET TAB 40]    TAKE 1 TABLET BY MOUTH DAILY    Disp: 30 tablet R: 11 Start: 12/13/2010 Class: Normal    Last refill: 11/13/2010       Contacts         Type Contact Phone    12/13/2010  9:51 AM Interface (Incoming) [facility] [phone]      Allergies as of 12/13/2010   Date Reviewed: 08/20/2008        Noted Type  Reactions    Naproxen                Patient Flags     No FYI flags for this patient        Pharmacy      [facility] - [place], [place] - [address] [facility] [address] [address]    [address] [place] [address]    Phone: [phone] Fax: [phone]

## 2010-12-14 ENCOUNTER — Other Ambulatory Visit: Payer: Self-pay

## 2010-12-14 MED ORDER — TRAZODONE HCL 100 MG PO TABS: 100.0000 mg | ORAL_TABLET | Freq: Every day | ORAL | Status: DC

## 2010-12-14 MED ORDER — TRAMADOL HCL 50 MG PO TABS: 50.0000 mg | ORAL_TABLET | Freq: Four times a day (QID) | ORAL | Status: DC | PRN

## 2010-12-14 MED ORDER — BUSPIRONE HCL 10 MG PO TABS: 10.0000 mg | ORAL_TABLET | Freq: Three times a day (TID) | ORAL | Status: DC

## 2010-12-14 MED ORDER — CITALOPRAM HYDROBROMIDE 40 MG PO TABS: 40.0000 mg | ORAL_TABLET | Freq: Every day | ORAL | Status: DC

## 2010-12-25 ENCOUNTER — Encounter: Payer: Self-pay | Admitting: Internal Medicine

## 2010-12-31 ENCOUNTER — Emergency Department (HOSPITAL_COMMUNITY)
Admission: EM | Admit: 2010-12-31 | Discharge: 2010-12-31 | Disposition: A | Discharge status: Home / Self Care | Payer: 59 | Attending: Emergency Medicine | Admitting: Emergency Medicine

## 2010-12-31 DIAGNOSIS — K439 Ventral hernia without obstruction or gangrene: Secondary | ICD-10-CM | POA: Insufficient documentation

## 2010-12-31 DIAGNOSIS — J45909 Unspecified asthma, uncomplicated: Secondary | ICD-10-CM | POA: Insufficient documentation

## 2010-12-31 DIAGNOSIS — R109 Unspecified abdominal pain: Secondary | ICD-10-CM | POA: Insufficient documentation

## 2010-12-31 DIAGNOSIS — I10 Essential (primary) hypertension: Secondary | ICD-10-CM | POA: Insufficient documentation

## 2010-12-31 DIAGNOSIS — R197 Diarrhea, unspecified: Secondary | ICD-10-CM | POA: Insufficient documentation

## 2010-12-31 LAB — URINALYSIS, ROUTINE W REFLEX MICROSCOPIC
Glucose, UA: NEGATIVE mg/dL
Ketones, ur: NEGATIVE mg/dL
Leukocytes, UA: NEGATIVE
Nitrite: NEGATIVE
pH: 7 (ref 5.0–8.0)

## 2011-01-04 ENCOUNTER — Encounter: Payer: Self-pay | Admitting: Internal Medicine

## 2011-01-04 ENCOUNTER — Ambulatory Visit (INDEPENDENT_AMBULATORY_CARE_PROVIDER_SITE_OTHER): Payer: 59 | Admitting: Internal Medicine

## 2011-01-04 ENCOUNTER — Other Ambulatory Visit (INDEPENDENT_AMBULATORY_CARE_PROVIDER_SITE_OTHER): Payer: 59

## 2011-01-04 VITALS — BP 118/82 | HR 66 | Temp 97.9°F | Ht 62.0 in | Wt 234.1 lb

## 2011-01-04 DIAGNOSIS — I10 Essential (primary) hypertension: Secondary | ICD-10-CM

## 2011-01-04 DIAGNOSIS — K439 Ventral hernia without obstruction or gangrene: Secondary | ICD-10-CM

## 2011-01-04 DIAGNOSIS — E039 Hypothyroidism, unspecified: Secondary | ICD-10-CM

## 2011-01-04 DIAGNOSIS — F411 Generalized anxiety disorder: Secondary | ICD-10-CM

## 2011-01-04 LAB — TSH: TSH: 0.65 u[IU]/mL (ref 0.35–5.50)

## 2011-01-04 MED ORDER — LISINOPRIL-HYDROCHLOROTHIAZIDE 10-12.5 MG PO TABS: 1.0000 | ORAL_TABLET | Freq: Every day | ORAL | Status: DC

## 2011-01-04 MED ORDER — CITALOPRAM HYDROBROMIDE 40 MG PO TABS: 40.0000 mg | ORAL_TABLET | Freq: Every day | ORAL | Status: DC

## 2011-01-04 MED ORDER — BUSPIRONE HCL 10 MG PO TABS: 20.0000 mg | ORAL_TABLET | Freq: Two times a day (BID) | ORAL | Status: DC

## 2011-01-04 MED ORDER — ALPRAZOLAM 0.5 MG PO TABS: 0.5000 mg | ORAL_TABLET | Freq: Three times a day (TID) | ORAL | Status: DC | PRN

## 2011-01-04 MED ORDER — TRAMADOL HCL 50 MG PO TABS: 50.0000 mg | ORAL_TABLET | Freq: Four times a day (QID) | ORAL | Status: DC | PRN

## 2011-01-04 MED ORDER — ALBUTEROL SULFATE HFA 108 (90 BASE) MCG/ACT IN AERS: 2.0000 | INHALATION_SPRAY | Freq: Four times a day (QID) | RESPIRATORY_TRACT | Status: DC | PRN

## 2011-01-04 MED ORDER — HYDROCODONE-ACETAMINOPHEN 5-500 MG PO TABS: 1.0000 | ORAL_TABLET | Freq: Every evening | ORAL | Status: DC | PRN

## 2011-01-04 MED ORDER — DICLOFENAC SODIUM 1 % TD GEL: TRANSDERMAL | Status: DC

## 2011-01-04 MED ORDER — TRAZODONE HCL 150 MG PO TABS: 150.0000 mg | ORAL_TABLET | Freq: Every day | ORAL | Status: DC

## 2011-01-04 MED ORDER — FLUTICASONE PROPIONATE 50 MCG/ACT NA SUSP: 2.0000 | NASAL | Status: DC

## 2011-01-04 NOTE — Assessment & Plan Note (Signed)
Check TSH now and adjust as needed Lab Results  Component Value Date   TSH 0.21* 01/11/2010

## 2011-01-04 NOTE — Assessment & Plan Note (Signed)
Increase symptoms with stressors at home (marriage) and work (changing jobs) Increase trazadone for sleep - increase buspar and add prn ativan - new rx done To follow up 6 weeks if unimporved, sooner if worse Denies si/hi

## 2011-01-04 NOTE — Assessment & Plan Note (Signed)
BP Readings from Last 3 Encounters:  01/04/11 118/82  01/13/10 128/84  01/13/10 128/84   The current medical regimen is effective;  continue present plan and medications.

## 2011-01-04 NOTE — Progress Notes (Signed)
  Subjective:    Patient ID: Wendy Carr, female    DOB: [birth year], 55 y.o.   MRN: [id number]  HPI here for follow up - reviewed chronic medical issues:   depression/anxiety - has been on lexapro and celexa in past - resumed citalopram, trazodone in 12/2009 along with seroquel - feels better but increased stress so not sleeping well - no adv SE and 100% med compliance - ?change to alternate pill  HTN - reports compliance with ongoing medical treatment and no changes in medication dose or frequency. denies adverse side effects related to current therapy. no chest pain or edema   hypothyroid - reports compliance with ongoing medical treatment and no changes in medication dose or frequency. denies adverse side effects related to current therapy.   asthma - reports compliance with ongoing medical treatment and no changes in medication dose or frequency. denies adverse side effects related to current therapy.   Also complains of intermittent "knot" on L upper abdominal wall - "pushes out" with cough and increased activity, able to massage back into place but painful with "knotted up" - size of .50 piece when out - no change in bowel movement- no nausea and vomiting or fever  Past Medical History  Diagnosis Date  . GERD   . HYPERTENSION   . ALLERGIC RHINITIS   . ANEMIA-NOS   . ANXIETY   . ASTHMA   . HYPOTHYROIDISM   . HYPERGLYCEMIA, BORDERLINE     Review of Systems  Constitutional: Negative for fever and unexpected weight change.  Respiratory: Negative for cough.   Cardiovascular: Negative for chest pain.       Objective:   Physical Exam BP 118/82  Pulse 66  Temp(Src) 97.9 F (36.6 C) (Oral)  Ht 5\' 2"  (1.575 m)  Wt 234 lb 1.9 oz (106.196 kg)  BMI 42.82 kg/m2  SpO2 99% Wt Readings from Last 3 Encounters:  01/04/11 234 lb 1.9 oz (106.196 kg)  01/13/10 214 lb 12.8 oz (97.433 kg)  12/08/09 211 lb 12.8 oz (96.072 kg)   Constitutional: She overweight. She appears  well-developed and well-nourished. No distress.  Cardiovascular: Normal rate, regular rhythm and normal heart sounds.  No murmur heard. No BLE edema. Pulmonary/Chest: Effort normal and breath sounds normal. No respiratory distress. She has no wheezes.  Abdominal: Soft. Bowel sounds are normal. She exhibits no distension. no tenderness but palp small hernia abdominal wall along LUQ, reduced Psychiatric: She has a depressed mood and affect. Her behavior is normal. Judgment and thought content normal.   Lab Results  Component Value Date   WBC 7.1 01/11/2010   HGB 12.9 10/28/2010   HCT 38.0 10/28/2010   PLT 149.0* 01/11/2010   CHOL 184 01/11/2010   TRIG 116.0 01/11/2010   HDL 37.40* 01/11/2010   ALT 32 01/11/2010   AST 33 01/11/2010   NA 140 10/28/2010   K 4.1 10/28/2010   CL 107 10/28/2010   CREATININE 0.90 10/28/2010   BUN 19 10/28/2010   CO2 27 01/11/2010   TSH 0.21* 01/11/2010   HGBA1C 6.1 09/03/2008        Assessment & Plan:  See problem list. Medications and labs reviewed today.  Left abdominal wall hernia, small and reduced but describes pain with protrusion/activity - refer to gen surg for eval and tx - education on dx and reassurance provided

## 2011-01-04 NOTE — Patient Instructions (Signed)
It was good to see you today. Test(s) ordered today. Your results will be called to you after review (48-72hours after test completion). If any changes need to be made, you will be notified at that time. Increase trazodone and buspar as discussed and use xanax as needed for nerves - Your prescription(s) have been submitted to your pharmacy. Please take as directed and contact our office if you believe you are having problem(s) with the medication(s). Other Medications reviewed, no changes at this time. Refill on medication(s) as discussed today. we'll make referral to surgeon for your hernia. Our office will contact you regarding appointment(s) once made. Pain pills as needed until this is repaired Please schedule followup in 4-6 weeks to review anxiety/depression symptoms and medications, call sooner if problems.

## 2011-01-17 ENCOUNTER — Other Ambulatory Visit: Payer: Self-pay | Admitting: *Deleted

## 2011-01-17 DIAGNOSIS — F411 Generalized anxiety disorder: Secondary | ICD-10-CM

## 2011-01-17 MED ORDER — ALPRAZOLAM 0.5 MG PO TABS: 0.5000 mg | ORAL_TABLET | Freq: Three times a day (TID) | ORAL | Status: DC | PRN

## 2011-01-17 NOTE — Telephone Encounter (Signed)
Faxed script back to [facility] @ [phone].Marland KitchenMarland Kitchen9/12/12@2 :03pm/LMB

## 2011-01-22 ENCOUNTER — Ambulatory Visit
Admission: RE | Admit: 2011-01-22 | Discharge: 2011-01-22 | Disposition: A | Discharge status: Home / Self Care | Payer: 59 | Source: Ambulatory Visit | Attending: Surgery | Admitting: Surgery

## 2011-01-22 ENCOUNTER — Ambulatory Visit (INDEPENDENT_AMBULATORY_CARE_PROVIDER_SITE_OTHER): Payer: Commercial Managed Care - PPO | Admitting: Surgery

## 2011-01-22 ENCOUNTER — Encounter (INDEPENDENT_AMBULATORY_CARE_PROVIDER_SITE_OTHER): Payer: Self-pay | Admitting: Surgery

## 2011-01-22 ENCOUNTER — Telehealth (INDEPENDENT_AMBULATORY_CARE_PROVIDER_SITE_OTHER): Payer: Self-pay

## 2011-01-22 ENCOUNTER — Other Ambulatory Visit (INDEPENDENT_AMBULATORY_CARE_PROVIDER_SITE_OTHER): Payer: Self-pay | Admitting: Surgery

## 2011-01-22 VITALS — BP 140/86 | HR 80 | Temp 97.4°F | Ht 62.75 in | Wt 235.0 lb

## 2011-01-22 DIAGNOSIS — R0781 Pleurodynia: Secondary | ICD-10-CM

## 2011-01-22 DIAGNOSIS — R079 Chest pain, unspecified: Secondary | ICD-10-CM

## 2011-01-22 NOTE — Patient Instructions (Signed)
Managing Pain  Pain related to activity/trauma is often due to strain/injury to muscle, tendon, nerves and/or incisions.  This pain is usually short-term and will improve in a few months.   Many people find it helpful to do the following things TOGETHER to help speed the process of healing and to get back to regular activity more quickly:  1. Avoid heavy physical activity a.  no lifting greater than 20 pounds b. Do not "push through" the pain.  Listen to your body and avoid positions and maneuvers than reproduce the pain c. Walking is okay as tolerated, but go slowly and stop when getting sore.  d. Remember: If it hurts to do it, then don't do it! 2. Take Anti-inflammatory medication  a. Take with food/snack around the clock for 1-2 weeks i. This helps the muscle and nerve tissues become less irritable and calm down faster b. Choose ONE of the following over-the-counter medications: i. Ibuprofen 200mg  tabs (ex. Advil, Motrin) 4 pills with every meal and just before bedtime ii. Acetaminophen 500mg  tabs (Tylenol) 2 pills with every meal and just before bedtime 3. Use a Heating pad or Ice/Cold Pack a. 4-6 times a day b. May use warm bath/hottub  or showers 4. Try Gentle Massage and/or Stretching  a. at the area of pain many times a day b. stop if you feel pain - do not overdo it  Try these steps together to help you body heal faster and avoid making things get worse.  Doing just one of these things may not be enough.    If you are not getting better after 3 weeks or are noticing you are getting worse, contact our office for further advice; we may need to re-evaluate you & see what other things we can do to help.  STOP SMOKING  We strongly recommend that you stop smoking.  Smoking increases the risk of surgery including infection in the form of an open wound, pus formation, abscess, hernia at an incision on the abdomen, etc.  You have an increased risk of other MAJOR complications such as  stroke, heart attack, forming clots in the leg and/or lungs, and death.    While it can be one of the most difficult things to do, the [place] community has programs to help you stop.  Consider talking with your primary care physician about options.  Also, Smoking Cessation classes are available through the [facility]. Call [phone] or check the Classes and Support Groups section of the Web site such as: http://www.hanson.biz/.cfm?[email or web address]

## 2011-01-22 NOTE — Progress Notes (Signed)
Subjective:     Patient ID: Wendy Carr, female   DOB: 29-Jul-1955, 55 y.o.   MRN: [id number]  HPI  Reason for visit: Pain in one of her left upper abdomen.? Hernia.  Patient is a pleasant morbidly obese female who's 4 months ago noticed a little bit of a lump on her left chest wall/upper abdomen. However, a few weeks ago it started getting sore and uncomfortable. 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 It's worse with activity. She does smoke a few cigarettes a day. It is worse with cough. No fevers chills or sweats. She saw her primary care physician. There was concern of a mass there. Question of hernia. Therefore, the patient was sent to [place] for surgical isolation.  Patient normally can walk 20-30 minutes without difficulty. It is uncomfortable to lie on the left side. Heat does not help. Ice does. No fevers chills or sweats. She's had episodes of vomiting in the past but not recently. No severe nausea associated with this. Normal eating okay.  No history of urine infections. No UTIs. No history of fall or trauma to the area. No recent car accident.  Review of Systems  Constitutional: Negative for fever, chills, diaphoresis, appetite change and fatigue.  HENT: Positive for congestion. Negative for ear pain, sore throat, trouble swallowing, neck pain and ear discharge.   Eyes: Negative for photophobia, discharge and visual disturbance.  Respiratory: Positive for choking and wheezing. Negative for cough, chest tightness and shortness of breath.   Cardiovascular: Positive for leg swelling. Negative for chest pain and palpitations.  Gastrointestinal: Positive for vomiting. Negative for nausea, abdominal pain, diarrhea, constipation, anal bleeding and rectal pain.       BM daily, often loose  Genitourinary: Negative for dysuria, frequency and difficulty urinating.  Musculoskeletal: Positive for arthralgias. Negative for myalgias and gait problem.  Skin:  Negative for color change, pallor and rash.  Neurological: Positive for weakness and headaches. Negative for dizziness, speech difficulty and numbness.  Hematological: Negative for adenopathy.       Had swollen LN in neck in distant past. W/U negative.  Psychiatric/Behavioral: Negative for confusion and agitation. The patient is not nervous/anxious.        Objective:   Physical Exam  Constitutional: She is oriented to person, place, and time. She appears well-developed and well-nourished. No distress.  HENT:  Head: Normocephalic.  Mouth/Throat: Oropharynx is clear and moist. No oropharyngeal exudate.  Eyes: Conjunctivae and EOM are normal. Pupils are equal, round, and reactive to light. No scleral icterus.  Neck: Normal range of motion. Neck supple. No tracheal deviation present.  Cardiovascular: Normal rate, regular rhythm and intact distal pulses.   Pulmonary/Chest: Effort normal and breath sounds normal. No respiratory distress. She has no wheezes. She has no rales.         TTP left lower costal ridge at anterior axillary line, esp at tip of floating rib, probably rib #11.  No stepoff, hernia  Abdominal: Soft. Bowel sounds are normal. She exhibits no distension and no mass. There is no tenderness. There is no rebound and no guarding. Hernia confirmed negative in the right inguinal area and confirmed negative in the left inguinal area.       Pfannenstiel incision well-healed.  No hernias  Genitourinary: No vaginal discharge found.  Musculoskeletal: Normal range of motion. She exhibits no tenderness.  Lymphadenopathy:    She has no cervical adenopathy.  Right: No inguinal adenopathy present.       Left: No inguinal adenopathy present.  Neurological: She is alert and oriented to person, place, and time. No cranial nerve deficit. She exhibits normal muscle tone. Coordination normal.  Skin: Skin is warm and dry. No rash noted. She is not diaphoretic. No erythema.  Psychiatric: She  has a normal mood and affect. Her behavior is normal. Judgment and thought content normal.       Assessment:     Tenderness at the tip of left floating rib. No discrete mass nor fracture obvious. No hernia.    Plan:     At this point, my hope is this will improve with anti-inflammatories and time. I recommended she try ibuprofen. She noted 600 mg only partially helped. I recommended repeat 100 mg q.i.d. Ice or heat up to 6 times a day.  X-ray films of the chest and ribs to make sure no evidence of fracture or other abnormality.  The pain should improve over the next few months. If it is not improved or worse in the next few weeks, I recommended she call us. Backup plan would be consider CT of the chest abdomen and pelvis to make sure there is not a concerning mass or tumor there. Hernia seems highly unlikely there in the absence of any incision or trauma. She and her husband are reassured.

## 2011-01-22 NOTE — Telephone Encounter (Signed)
Called pt to notify her CXR results to be ok per Dr Michaell Cowing. The CXR showed no rib fractures or pneumonia./ [facility]

## 2011-02-01 ENCOUNTER — Other Ambulatory Visit: Payer: Self-pay | Admitting: *Deleted

## 2011-02-01 DIAGNOSIS — F411 Generalized anxiety disorder: Secondary | ICD-10-CM

## 2011-02-01 LAB — CBC
HCT: 33.5 — ABNORMAL LOW
HCT: 36.6
Hemoglobin: 11 — ABNORMAL LOW
Hemoglobin: 12
MCHC: 33.5
MCHC: 32.7
MCHC: 32.7
MCHC: 32.9
MCV: 86.8
MCV: 87.4
MCV: 88.1
Platelets: 131 — ABNORMAL LOW
Platelets: 143 — ABNORMAL LOW
Platelets: 144 — ABNORMAL LOW
Platelets: 145 — ABNORMAL LOW
RBC: 3.81 — ABNORMAL LOW
RDW: 17.2 — ABNORMAL HIGH
RDW: 17.5 — ABNORMAL HIGH
WBC: 10
WBC: 7.7

## 2011-02-01 LAB — POCT CARDIAC MARKERS
CKMB, poc: 1.5
CKMB, poc: <1 — ABNORMAL LOW
Myoglobin, poc: 71.2
Operator id: 264031
Troponin i, poc: <0.05

## 2011-02-01 LAB — CULTURE, BLOOD (ROUTINE X 2): Culture: NO GROWTH

## 2011-02-01 LAB — ALT: ALT: 42 — ABNORMAL HIGH

## 2011-02-01 LAB — POCT I-STAT, CHEM 8
Calcium, Ion: 1.08 — ABNORMAL LOW
Calcium, Ion: 1.13
Chloride: 107
Chloride: 105
Creatinine, Ser: 0.9
Glucose, Bld: 115 — ABNORMAL HIGH
Glucose, Bld: 162 — ABNORMAL HIGH
HCT: 35 — ABNORMAL LOW
HCT: 35 — ABNORMAL LOW
Hemoglobin: 11.9 — ABNORMAL LOW
Hemoglobin: 12.9
Potassium: 4
Sodium: 138
TCO2: 21

## 2011-02-01 LAB — H1N1 SCREEN (PCR): H1N1 Virus Scrn: DETECTED

## 2011-02-01 LAB — BLOOD GAS, ARTERIAL
Acid-base deficit: 1.6
TCO2: 20.3
pCO2 arterial: 37.4
pH, Arterial: 7.394
pO2, Arterial: 84.2

## 2011-02-01 LAB — COMPREHENSIVE METABOLIC PANEL
AST: 35
Albumin: 3.1 — ABNORMAL LOW
BUN: 9
Calcium: 8.5
Creatinine, Ser: 0.84
GFR calc Af Amer: >60
GFR calc non Af Amer: >60

## 2011-02-01 LAB — DIFFERENTIAL
Basophils Relative: 1
Basophils Relative: 1
Eosinophils Absolute: 0.1
Monocytes Absolute: 0.7
Monocytes Relative: 11
Neutro Abs: 8.2 — ABNORMAL HIGH
Neutro Abs: 4.3
Neutrophils Relative %: 82 — ABNORMAL HIGH

## 2011-02-01 LAB — HEMOGLOBIN A1C
Hgb A1c MFr Bld: 5.9
Mean Plasma Glucose: 133

## 2011-02-01 LAB — AST: AST: 35

## 2011-02-01 LAB — CULTURE, RESPIRATORY W GRAM STAIN: Culture: NORMAL

## 2011-02-01 LAB — EXPECTORATED SPUTUM ASSESSMENT W GRAM STAIN, RFLX TO RESP C

## 2011-02-01 LAB — RAPID STREP SCREEN (MED CTR MEBANE ONLY): Streptococcus, Group A Screen (Direct): NEGATIVE

## 2011-02-01 MED ORDER — ALPRAZOLAM 0.5 MG PO TABS: 0.5000 mg | ORAL_TABLET | Freq: Three times a day (TID) | ORAL | Status: DC | PRN

## 2011-02-02 MED ORDER — ALPRAZOLAM 0.5 MG PO TABS: 0.5000 mg | ORAL_TABLET | Freq: Three times a day (TID) | ORAL | Status: DC | PRN

## 2011-02-02 NOTE — Telephone Encounter (Signed)
Faxed script back to [facility] @ [phone].Marland KitchenMarland Kitchen9/28/12@10 :27am/LMB

## 2011-02-07 ENCOUNTER — Ambulatory Visit (INDEPENDENT_AMBULATORY_CARE_PROVIDER_SITE_OTHER): Payer: Commercial Managed Care - PPO | Admitting: Internal Medicine

## 2011-02-07 ENCOUNTER — Encounter: Payer: Self-pay | Admitting: Internal Medicine

## 2011-02-07 VITALS — BP 128/82 | HR 76 | Temp 98.1°F | Ht 63.0 in | Wt 236.1 lb

## 2011-02-07 DIAGNOSIS — I10 Essential (primary) hypertension: Secondary | ICD-10-CM

## 2011-02-07 DIAGNOSIS — K439 Ventral hernia without obstruction or gangrene: Secondary | ICD-10-CM

## 2011-02-07 DIAGNOSIS — Z23 Encounter for immunization: Secondary | ICD-10-CM

## 2011-02-07 DIAGNOSIS — J45909 Unspecified asthma, uncomplicated: Secondary | ICD-10-CM

## 2011-02-07 DIAGNOSIS — F411 Generalized anxiety disorder: Secondary | ICD-10-CM

## 2011-02-07 MED ORDER — BUSPIRONE HCL 10 MG PO TABS: 20.0000 mg | ORAL_TABLET | Freq: Two times a day (BID) | ORAL | Status: DC

## 2011-02-07 MED ORDER — CYCLOBENZAPRINE HCL 5 MG PO TABS: 5.0000 mg | ORAL_TABLET | Freq: Three times a day (TID) | ORAL | Status: DC | PRN

## 2011-02-07 MED ORDER — BECLOMETHASONE DIPROPIONATE 40 MCG/ACT IN AERS: 2.0000 | INHALATION_SPRAY | Freq: Two times a day (BID) | RESPIRATORY_TRACT | Status: DC

## 2011-02-07 MED ORDER — METHYLPREDNISOLONE ACETATE 80 MG/ML IJ SUSP
80.0000 mg | Freq: Once | INTRAMUSCULAR | Status: AC
  Administered 2011-02-07: 80 mg via INTRAMUSCULAR

## 2011-02-07 MED ORDER — CITALOPRAM HYDROBROMIDE 40 MG PO TABS: 40.0000 mg | ORAL_TABLET | Freq: Every day | ORAL | Status: DC

## 2011-02-07 MED ORDER — TRAMADOL HCL 50 MG PO TABS: 50.0000 mg | ORAL_TABLET | Freq: Four times a day (QID) | ORAL | Status: DC | PRN

## 2011-02-07 MED ORDER — TRAZODONE HCL 50 MG PO TABS: 50.0000 mg | ORAL_TABLET | Freq: Every day | ORAL | Status: DC

## 2011-02-07 MED ORDER — ALPRAZOLAM 0.5 MG PO TABS: 0.5000 mg | ORAL_TABLET | Freq: Three times a day (TID) | ORAL | Status: DC | PRN

## 2011-02-07 NOTE — Assessment & Plan Note (Signed)
BP Readings from Last 3 Encounters:  02/07/11 128/82  01/22/11 140/86  01/04/11 118/82   The current medical regimen is effective;  continue present plan and medications.

## 2011-02-07 NOTE — Assessment & Plan Note (Signed)
chronic symptoms exac stressors at home (marriage) and work (changing jobs) Increased trazadone for sleep 12/2010 - also increase buspar 12/2010 and added prn ativan -  The current medical regimen is effective;  continue present plan and medications. Denies si/hi

## 2011-02-07 NOTE — Patient Instructions (Signed)
It was good to see you today. Medrol shot for wheeze given to you today Medications reviewed, discussed changes and refills given on medication(s) as discussed today. Please schedule followup in 3-4 months for anxiety, blood pressure and thyroid check, call sooner if problems.

## 2011-02-07 NOTE — Progress Notes (Signed)
  Subjective:    Patient ID: Wendy Carr, female    DOB: 03-Jan-1956, 55 y.o.   MRN: [id number]  HPI  here for follow up - reviewed chronic medical issues:   depression/anxiety - has been on lexapro and celexa in past - resumed citalopram, trazodone in 12/2009 along with seroquel - feels better but increased stress so not sleeping well - no adv SE and 100% med compliance - started xanax prn 12/2010  HTN - reports compliance with ongoing medical treatment and no changes in medication dose or frequency. denies adverse side effects related to current therapy. no chest pain or edema   hypothyroid - reports compliance with ongoing medical treatment and no changes in medication dose or frequency. denies adverse side effects related to current therapy.   asthma - reports compliance with ongoing medical treatment and no changes in medication dose or frequency. denies adverse side effects related to current therapy.   ?Abdominal hernia - intermittent "knot" on L upper abdominal wall - seen by surg for same 12/2010> felt to be rib, not hernia - symptomatic care and reassurance provided  Past Medical History  Diagnosis Date  . GERD   . HYPERTENSION   . ALLERGIC RHINITIS   . ANEMIA-NOS   . ANXIETY   . ASTHMA   . HYPOTHYROIDISM   . HYPERGLYCEMIA, BORDERLINE   . Personal history of colonic polyps 2011    last colonscopy 2011: no polyps.    Review of Systems  Constitutional: Negative for fever and unexpected weight change.  Respiratory: Negative for cough.   Cardiovascular: Negative for chest pain.       Objective:   Physical Exam  BP 128/82  Pulse 76  Temp(Src) 98.1 F (36.7 C) (Oral)  Ht 5\' 3"  (1.6 m)  Wt 236 lb 1.9 oz (107.103 kg)  BMI 41.83 kg/m2  SpO2 97% Wt Readings from Last 3 Encounters:  02/07/11 236 lb 1.9 oz (107.103 kg)  01/22/11 235 lb (106.595 kg)  01/04/11 234 lb 1.9 oz (106.196 kg)   Constitutional: She overweight. She appears well-developed and well-nourished.  No distress.  Cardiovascular: Normal rate, regular rhythm and normal heart sounds.  No murmur heard. No BLE edema. Pulmonary/Chest: Effort normal and breath sounds normal. No respiratory distress. She has no wheezes.  Psychiatric: She has a depressed mood and affect. Her behavior is normal. Judgment and thought content normal.   Lab Results  Component Value Date   WBC 7.1 01/11/2010   HGB 12.9 10/28/2010   HCT 38.0 10/28/2010   PLT 149.0* 01/11/2010   CHOL 184 01/11/2010   TRIG 116.0 01/11/2010   HDL 37.40* 01/11/2010   ALT 32 01/11/2010   AST 33 01/11/2010   NA 140 10/28/2010   K 4.1 10/28/2010   CL 107 10/28/2010   CREATININE 0.90 10/28/2010   BUN 19 10/28/2010   CO2 27 01/11/2010   TSH 0.65 01/04/2011   HGBA1C 6.1 09/03/2008        Assessment & Plan:  See problem list. Medications and labs reviewed today.

## 2011-02-07 NOTE — Assessment & Plan Note (Signed)
Mild exac precipitated by running out of steroid inhaler IM medrol today and refill meds - reminded of need for compliance

## 2011-02-08 ENCOUNTER — Emergency Department (HOSPITAL_COMMUNITY): Payer: No Typology Code available for payment source

## 2011-02-08 ENCOUNTER — Emergency Department (HOSPITAL_COMMUNITY)
Admission: EM | Admit: 2011-02-08 | Discharge: 2011-02-08 | Disposition: A | Discharge status: Home / Self Care | Payer: No Typology Code available for payment source | Attending: Emergency Medicine | Admitting: Emergency Medicine

## 2011-02-08 DIAGNOSIS — J45909 Unspecified asthma, uncomplicated: Secondary | ICD-10-CM | POA: Insufficient documentation

## 2011-02-08 DIAGNOSIS — R079 Chest pain, unspecified: Secondary | ICD-10-CM | POA: Insufficient documentation

## 2011-02-08 DIAGNOSIS — M25559 Pain in unspecified hip: Secondary | ICD-10-CM | POA: Insufficient documentation

## 2011-02-08 DIAGNOSIS — R51 Headache: Secondary | ICD-10-CM | POA: Insufficient documentation

## 2011-02-08 DIAGNOSIS — M545 Low back pain, unspecified: Secondary | ICD-10-CM | POA: Insufficient documentation

## 2011-02-08 DIAGNOSIS — M549 Dorsalgia, unspecified: Secondary | ICD-10-CM | POA: Insufficient documentation

## 2011-02-08 DIAGNOSIS — I10 Essential (primary) hypertension: Secondary | ICD-10-CM | POA: Insufficient documentation

## 2011-02-08 DIAGNOSIS — M25519 Pain in unspecified shoulder: Secondary | ICD-10-CM | POA: Insufficient documentation

## 2011-02-08 DIAGNOSIS — T07XXXA Unspecified multiple injuries, initial encounter: Secondary | ICD-10-CM | POA: Insufficient documentation

## 2011-02-08 DIAGNOSIS — M79609 Pain in unspecified limb: Secondary | ICD-10-CM | POA: Insufficient documentation

## 2011-02-08 LAB — POCT I-STAT TROPONIN I: Troponin i, poc: 0 ng/mL (ref 0.00–0.08)

## 2011-02-09 ENCOUNTER — Telehealth: Payer: Self-pay | Admitting: *Deleted

## 2011-02-09 NOTE — Telephone Encounter (Signed)
Ok to take 100 mg tramadol as needed every 6 hrs until pain improved for now (pt already has rx)

## 2011-02-09 NOTE — Telephone Encounter (Signed)
Left detailed VM for patient.
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
## 2011-02-13 LAB — URINALYSIS, ROUTINE W REFLEX MICROSCOPIC
Hgb urine dipstick: NEGATIVE
Protein, ur: NEGATIVE
Urobilinogen, UA: 1

## 2011-02-13 LAB — DIFFERENTIAL
Eosinophils Relative: 4
Lymphocytes Relative: 31
Lymphs Abs: 1.7
Monocytes Absolute: 0.3

## 2011-02-13 LAB — COMPREHENSIVE METABOLIC PANEL
AST: 45 — ABNORMAL HIGH
Albumin: 3.1 — ABNORMAL LOW
Calcium: 8.3 — ABNORMAL LOW
Creatinine, Ser: 0.75
GFR calc Af Amer: >60

## 2011-02-13 LAB — CBC
MCHC: 31.2
MCV: 71.5 — ABNORMAL LOW
Platelets: 234
WBC: 5.4

## 2011-04-12 ENCOUNTER — Other Ambulatory Visit: Payer: Self-pay | Admitting: *Deleted

## 2011-04-12 DIAGNOSIS — F411 Generalized anxiety disorder: Secondary | ICD-10-CM

## 2011-04-12 MED ORDER — ALPRAZOLAM 0.5 MG PO TABS: 0.5000 mg | ORAL_TABLET | Freq: Three times a day (TID) | ORAL | Status: DC | PRN

## 2011-04-12 NOTE — Telephone Encounter (Signed)
Faxed script back to [facility]...04/12/11@2 :21pm/LMB

## 2011-04-27 ENCOUNTER — Ambulatory Visit (INDEPENDENT_AMBULATORY_CARE_PROVIDER_SITE_OTHER): Payer: No Typology Code available for payment source | Admitting: *Deleted

## 2011-04-27 DIAGNOSIS — Z111 Encounter for screening for respiratory tuberculosis: Secondary | ICD-10-CM

## 2011-05-02 ENCOUNTER — Telehealth: Payer: Self-pay | Admitting: *Deleted

## 2011-05-02 DIAGNOSIS — Z0000 Encounter for general adult medical examination without abnormal findings: Secondary | ICD-10-CM

## 2011-05-02 NOTE — Telephone Encounter (Signed)
Received staff msg pt coming in for cpx need labs in EPIC...05/02/11@8 :50am/LMB

## 2011-05-02 NOTE — Telephone Encounter (Signed)
Message copied by Deatra James on Wed May 02, 2011  8:49 AM ------      Message from: COUSIN, SHARON T      Created: Mon Apr 30, 2011 11:30 AM      Regarding: PHY DATE 05/03/11       PT DESIRE LABS TO BE DONE SAME DAY AS PHY---THANKS

## 2011-05-03 ENCOUNTER — Emergency Department (HOSPITAL_COMMUNITY)
Admission: EM | Admit: 2011-05-03 | Discharge: 2011-05-04 | Disposition: A | Discharge status: Home / Self Care | Payer: No Typology Code available for payment source | Attending: Emergency Medicine | Admitting: Emergency Medicine

## 2011-05-03 ENCOUNTER — Encounter: Payer: Self-pay | Admitting: Internal Medicine

## 2011-05-03 ENCOUNTER — Ambulatory Visit (INDEPENDENT_AMBULATORY_CARE_PROVIDER_SITE_OTHER): Payer: 59 | Admitting: Internal Medicine

## 2011-05-03 ENCOUNTER — Other Ambulatory Visit (INDEPENDENT_AMBULATORY_CARE_PROVIDER_SITE_OTHER): Payer: No Typology Code available for payment source

## 2011-05-03 ENCOUNTER — Encounter (HOSPITAL_COMMUNITY): Payer: Self-pay | Admitting: *Deleted

## 2011-05-03 VITALS — BP 148/90 | HR 80 | Temp 98.4°F | Wt 238.0 lb

## 2011-05-03 DIAGNOSIS — R42 Dizziness and giddiness: Secondary | ICD-10-CM | POA: Insufficient documentation

## 2011-05-03 DIAGNOSIS — S0003XA Contusion of scalp, initial encounter: Secondary | ICD-10-CM | POA: Insufficient documentation

## 2011-05-03 DIAGNOSIS — S0093XA Contusion of unspecified part of head, initial encounter: Secondary | ICD-10-CM

## 2011-05-03 DIAGNOSIS — S0083XA Contusion of other part of head, initial encounter: Secondary | ICD-10-CM | POA: Insufficient documentation

## 2011-05-03 DIAGNOSIS — I10 Essential (primary) hypertension: Secondary | ICD-10-CM | POA: Insufficient documentation

## 2011-05-03 DIAGNOSIS — Z0000 Encounter for general adult medical examination without abnormal findings: Secondary | ICD-10-CM

## 2011-05-03 DIAGNOSIS — K089 Disorder of teeth and supporting structures, unspecified: Secondary | ICD-10-CM

## 2011-05-03 DIAGNOSIS — Z79899 Other long term (current) drug therapy: Secondary | ICD-10-CM | POA: Insufficient documentation

## 2011-05-03 DIAGNOSIS — K0889 Other specified disorders of teeth and supporting structures: Secondary | ICD-10-CM

## 2011-05-03 DIAGNOSIS — E039 Hypothyroidism, unspecified: Secondary | ICD-10-CM | POA: Insufficient documentation

## 2011-05-03 DIAGNOSIS — Z124 Encounter for screening for malignant neoplasm of cervix: Secondary | ICD-10-CM

## 2011-05-03 DIAGNOSIS — R51 Headache: Secondary | ICD-10-CM | POA: Insufficient documentation

## 2011-05-03 DIAGNOSIS — J45909 Unspecified asthma, uncomplicated: Secondary | ICD-10-CM

## 2011-05-03 DIAGNOSIS — K219 Gastro-esophageal reflux disease without esophagitis: Secondary | ICD-10-CM | POA: Insufficient documentation

## 2011-05-03 DIAGNOSIS — F411 Generalized anxiety disorder: Secondary | ICD-10-CM

## 2011-05-03 DIAGNOSIS — Z1239 Encounter for other screening for malignant neoplasm of breast: Secondary | ICD-10-CM

## 2011-05-03 LAB — URINALYSIS, ROUTINE W REFLEX MICROSCOPIC
Leukocytes, UA: NEGATIVE
Nitrite: NEGATIVE
Specific Gravity, Urine: 1.02 (ref 1.000–1.030)
Total Protein, Urine: NEGATIVE
pH: 6 (ref 5.0–8.0)

## 2011-05-03 LAB — TSH: TSH: 0.82 u[IU]/mL (ref 0.35–5.50)

## 2011-05-03 LAB — LIPID PANEL
Cholesterol: 186 mg/dL (ref 0–200)
LDL Cholesterol: 116 mg/dL — ABNORMAL HIGH (ref 0–99)
Triglycerides: 151 mg/dL — ABNORMAL HIGH (ref 0.0–149.0)
VLDL: 30.2 mg/dL (ref 0.0–40.0)

## 2011-05-03 LAB — CBC WITH DIFFERENTIAL/PLATELET
Basophils Absolute: 0 10*3/uL (ref 0.0–0.1)
Eosinophils Absolute: 0.5 10*3/uL (ref 0.0–0.7)
Hemoglobin: 11.1 g/dL — ABNORMAL LOW (ref 12.0–15.0)
Lymphocytes Relative: 31.7 % (ref 12.0–46.0)
MCHC: 31.9 g/dL (ref 30.0–36.0)
Monocytes Absolute: 0.6 10*3/uL (ref 0.1–1.0)
Neutro Abs: 2.8 10*3/uL (ref 1.4–7.7)
Neutrophils Relative %: 49.3 % (ref 43.0–77.0)
RDW: 19.1 % — ABNORMAL HIGH (ref 11.5–14.6)

## 2011-05-03 LAB — HEPATIC FUNCTION PANEL
AST: 38 U/L — ABNORMAL HIGH (ref 0–37)
Total Bilirubin: 0.4 mg/dL (ref 0.3–1.2)

## 2011-05-03 LAB — BASIC METABOLIC PANEL
Chloride: 102 mEq/L (ref 96–112)
Creatinine, Ser: 0.9 mg/dL (ref 0.4–1.2)
GFR: 79.83 mL/min (ref >60.00)

## 2011-05-03 MED ORDER — HYDROCODONE-ACETAMINOPHEN 5-500 MG PO TABS: 1.0000 | ORAL_TABLET | Freq: Every evening | ORAL | Status: DC | PRN

## 2011-05-03 MED ORDER — CYCLOBENZAPRINE HCL 5 MG PO TABS: 5.0000 mg | ORAL_TABLET | Freq: Three times a day (TID) | ORAL | Status: DC | PRN

## 2011-05-03 NOTE — ED Notes (Signed)
No bp done in left arm...Marland KitchenMarland KitchenMarland Kitchenblood pressure only to be done in right arm

## 2011-05-03 NOTE — Patient Instructions (Signed)
It was good to see you today. Test(s) ordered today. Your results will be called to you after review (48-72hours after test completion). If any changes need to be made, you will be notified at that time. Form for employer completed and signed today we'll make referral to gynecology and for mammogram. Our office will contact you regarding appointment(s) once made. Health Maintenance reviewed - everything else is up to date. Vicodin supply to use at night as needed for tooth pain - make an appointment with your dentist in the new year as no other refills on this medication will be provided Medications reviewed, no changes at this time. Please schedule followup in 3-4 months for blood pressure and weight check, call sooner if problems.

## 2011-05-03 NOTE — Assessment & Plan Note (Signed)
BP Readings from Last 3 Encounters:  05/03/11 148/90  02/07/11 128/82  01/22/11 140/86   The current medical regimen is generally effective;  continue present plan and medications.

## 2011-05-03 NOTE — Progress Notes (Signed)
Subjective:    Patient ID: Wendy Carr, female    DOB: May 09, 1955, 55 y.o.   MRN: [id number]  HPI  patient is here today for annual physical. Patient feels well overall.  Also reviewed chronic medical issues:   depression/anxiety - has been on lexapro and celexa in past - resumed citalopram, trazodone in 12/2009 along with seroquel - feels better but increased stress so not sleeping well - no adv SE and 100% med compliance - started xanax prn 12/2010 - doing well overall  HTN - reports compliance with ongoing medical treatment and no changes in medication dose or frequency. denies adverse side effects related to current therapy. no chest pain or edema   asthma - no flares in recent weeks - reports compliance with ongoing medical treatment and no changes in medication dose or frequency. denies adverse side effects related to current therapy.    complains of tooth pain - need to replace "rotten tooth with new post on upper left - no fever or drainage - partials still fitting well - ache worse at night  Past Medical History  Diagnosis Date  . GERD   . HYPERTENSION   . ALLERGIC RHINITIS   . ANEMIA-NOS   . ANXIETY   . ASTHMA   . HYPOTHYROIDISM   . HYPERGLYCEMIA, BORDERLINE   . Personal history of colonic polyps 2011    last colonscopy 2011: no polyps.   Family History  Problem Relation Age of Onset  . Hypertension Mother   . Osteoarthritis Mother   . Diabetes Sister   . Arthritis Other   . Pancreatic cancer Other   . Diabetes Other    History  Substance Use Topics  . Smoking status: Current Everyday Smoker -- 0.0 packs/day    Types: Cigarettes  . Smokeless tobacco: Not on file   Comment: Married 2009-lives with spouse, no kids  . Alcohol Use: No    Review of Systems Constitutional: Negative for fever or weight change.  Respiratory: Negative for cough and shortness of breath.   Cardiovascular: Negative for chest pain or palpitations.  Gastrointestinal: Negative for  abdominal pain, no bowel changes.  Musculoskeletal: Negative for gait problem or joint swelling.  Skin: Negative for rash.  Neurological: Negative for dizziness or headache.  No other specific complaints in a complete review of systems (except as listed in HPI above).     Objective:   Physical Exam  BP 148/90  Pulse 80  Temp(Src) 98.4 F (36.9 C) (Oral)  Wt 238 lb (107.956 kg)  SpO2 99% Wt Readings from Last 3 Encounters:  05/03/11 238 lb (107.956 kg)  02/07/11 236 lb 1.9 oz (107.103 kg)  01/22/11 235 lb (106.595 kg)   Constitutional: She overweight. She appears well-developed and well-nourished. No distress.  HENT: Head: Normocephalic and atraumatic. Ears: B TMs ok, no erythema or effusion; Nose: Nose normal.  Mouth/Throat: Oropharynx is clear and moist. No oropharyngeal exudate. Upper patiral removed - decay in upper left tooth "post" but remains intact  Eyes: Conjunctivae and EOM are normal. Pupils are equal, round, and reactive to light. No scleral icterus.  Neck: Normal range of motion. Neck supple. No JVD present. No thyromegaly present.  Cardiovascular: Normal rate, regular rhythm and normal heart sounds.  No murmur heard. No BLE edema. Pulmonary/Chest: Effort normal and breath sounds normal. No respiratory distress. She has no wheezes.  Abdominal: Soft. Bowel sounds are normal. She exhibits no distension. There is no tenderness. no masses Musculoskeletal: Normal range of motion, no joint  effusions. No gross deformities Neurological: She is alert and oriented to person, place, and time. No cranial nerve deficit. Coordination normal.  Skin: Skin is warm and dry. No rash noted. No erythema.  Psychiatric: She has a slightly anxious and depressed mood and affect. Her behavior is normal. Judgment and thought content normal.    Lab Results  Component Value Date   WBC 7.1 01/11/2010   HGB 12.9 10/28/2010   HCT 38.0 10/28/2010   PLT 149.0* 01/11/2010   CHOL 184 01/11/2010   TRIG  116.0 01/11/2010   HDL 37.40* 01/11/2010   ALT 32 01/11/2010   AST 33 01/11/2010   NA 140 10/28/2010   K 4.1 10/28/2010   CL 107 10/28/2010   CREATININE 0.90 10/28/2010   BUN 19 10/28/2010   CO2 27 01/11/2010   TSH 0.65 01/04/2011   HGBA1C 6.1 09/03/2008   EKG: sinus @74  bpm - no ischemic changes or arrythmias     Assessment & Plan:  CPX - v70.0 - Patient has been counseled on age-appropriate routine health concerns for screening and prevention. These are reviewed and up-to-date. Immunizations are up-to-date or declined. Labs ordered and ECG reviewed. Employment forms completed as requested  Tooth pain - no evidence for active infection on exam. Pain control for nighttime symptoms provided (Vicodin) and advised to make followup appointment with dentist in next 6 weeks  See problem list. Medications and labs reviewed today.

## 2011-05-03 NOTE — Assessment & Plan Note (Signed)
chronic symptoms exac stressors at home (marriage) and work (changing jobs) Increased trazadone for sleep 12/2010 - also increase buspar 12/2010 and added prn ativan -  The current medical regimen is effective;  continue present plan and medications. Denies si/hi  
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
## 2011-05-03 NOTE — Assessment & Plan Note (Signed)
Mild exac 02/2011 precipitated by running out of steroid inhaler The current medical regimen is effective;  continue present plan and medications.

## 2011-05-04 ENCOUNTER — Emergency Department (HOSPITAL_COMMUNITY): Payer: No Typology Code available for payment source

## 2011-05-04 LAB — HEMOGLOBIN A1C: Hgb A1c MFr Bld: 6.3 % (ref 4.6–6.5)

## 2011-05-04 MED ORDER — HYDROCODONE-ACETAMINOPHEN 5-325 MG PO TABS
1.0000 | ORAL_TABLET | Freq: Once | ORAL | Status: AC
  Administered 2011-05-04: 1 via ORAL
  Filled 2011-05-04: qty 1

## 2011-05-04 MED ORDER — DIAZEPAM 5 MG/ML IJ SOLN
5.0000 mg | Freq: Once | INTRAMUSCULAR | Status: AC
  Administered 2011-05-04: 5 mg via INTRAMUSCULAR
  Filled 2011-05-04: qty 2
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
## 2011-05-04 NOTE — ED Provider Notes (Signed)
Medical screening examination/treatment/procedure(s) were performed by non-physician practitioner and as supervising physician I was immediately available for consultation/collaboration.   Lyanne Co, MD 05/04/11 [phone]
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
## 2011-05-24 ENCOUNTER — Ambulatory Visit: Payer: No Typology Code available for payment source

## 2011-05-29 ENCOUNTER — Emergency Department (HOSPITAL_COMMUNITY): Payer: 59

## 2011-05-29 ENCOUNTER — Encounter (HOSPITAL_COMMUNITY): Payer: Self-pay | Admitting: Emergency Medicine

## 2011-05-29 ENCOUNTER — Other Ambulatory Visit: Payer: Self-pay | Admitting: Internal Medicine

## 2011-05-29 ENCOUNTER — Emergency Department (HOSPITAL_COMMUNITY)
Admission: EM | Admit: 2011-05-29 | Discharge: 2011-05-29 | Disposition: A | Discharge status: Home / Self Care | Payer: 59 | Attending: Emergency Medicine | Admitting: Emergency Medicine

## 2011-05-29 DIAGNOSIS — Z860100 Personal history of colon polyps, unspecified: Secondary | ICD-10-CM | POA: Insufficient documentation

## 2011-05-29 DIAGNOSIS — M25539 Pain in unspecified wrist: Secondary | ICD-10-CM | POA: Insufficient documentation

## 2011-05-29 DIAGNOSIS — Y998 Other external cause status: Secondary | ICD-10-CM | POA: Insufficient documentation

## 2011-05-29 DIAGNOSIS — F411 Generalized anxiety disorder: Secondary | ICD-10-CM | POA: Insufficient documentation

## 2011-05-29 DIAGNOSIS — R51 Headache: Secondary | ICD-10-CM | POA: Insufficient documentation

## 2011-05-29 DIAGNOSIS — K219 Gastro-esophageal reflux disease without esophagitis: Secondary | ICD-10-CM | POA: Insufficient documentation

## 2011-05-29 DIAGNOSIS — I10 Essential (primary) hypertension: Secondary | ICD-10-CM | POA: Insufficient documentation

## 2011-05-29 DIAGNOSIS — Y92009 Unspecified place in unspecified non-institutional (private) residence as the place of occurrence of the external cause: Secondary | ICD-10-CM | POA: Insufficient documentation

## 2011-05-29 DIAGNOSIS — F17200 Nicotine dependence, unspecified, uncomplicated: Secondary | ICD-10-CM | POA: Insufficient documentation

## 2011-05-29 DIAGNOSIS — S0990XA Unspecified injury of head, initial encounter: Secondary | ICD-10-CM | POA: Insufficient documentation

## 2011-05-29 DIAGNOSIS — E039 Hypothyroidism, unspecified: Secondary | ICD-10-CM | POA: Insufficient documentation

## 2011-05-29 DIAGNOSIS — D649 Anemia, unspecified: Secondary | ICD-10-CM | POA: Insufficient documentation

## 2011-05-29 DIAGNOSIS — R42 Dizziness and giddiness: Secondary | ICD-10-CM | POA: Insufficient documentation

## 2011-05-29 DIAGNOSIS — J45909 Unspecified asthma, uncomplicated: Secondary | ICD-10-CM | POA: Insufficient documentation

## 2011-05-29 DIAGNOSIS — W108XXA Fall (on) (from) other stairs and steps, initial encounter: Secondary | ICD-10-CM | POA: Insufficient documentation

## 2011-05-29 DIAGNOSIS — M542 Cervicalgia: Secondary | ICD-10-CM | POA: Insufficient documentation

## 2011-05-29 MED ORDER — ALBUTEROL SULFATE (5 MG/ML) 0.5% IN NEBU
5.0000 mg | INHALATION_SOLUTION | Freq: Once | RESPIRATORY_TRACT | Status: AC
  Administered 2011-05-29: 5 mg via RESPIRATORY_TRACT
  Filled 2011-05-29: qty 1

## 2011-05-29 MED ORDER — IPRATROPIUM BROMIDE 0.02 % IN SOLN
0.5000 mg | Freq: Once | RESPIRATORY_TRACT | Status: AC
  Administered 2011-05-29: 0.5 mg via RESPIRATORY_TRACT
  Filled 2011-05-29: qty 2.5

## 2011-05-29 MED ORDER — FENTANYL CITRATE 0.05 MG/ML IJ SOLN
50.0000 ug | Freq: Once | INTRAMUSCULAR | Status: AC
  Administered 2011-05-29: 50 ug via INTRAMUSCULAR
  Filled 2011-05-29: qty 2

## 2011-05-29 MED ORDER — LORAZEPAM 1 MG PO TABS
1.0000 mg | ORAL_TABLET | Freq: Once | ORAL | Status: AC
  Administered 2011-05-29: 1 mg via ORAL
  Filled 2011-05-29: qty 1

## 2011-05-29 NOTE — ED Provider Notes (Signed)
History     CSN: [id number]  Arrival date & time 05/29/11  1851   First MD Initiated Contact with Patient 05/29/11 2015      Chief Complaint  Patient presents with  . Fall    (Consider location/radiation/quality/duration/timing/severity/associated sxs/prior treatment) HPI  Patient presents to emergency department complaining of falling and hitting her head this morning as well as headache. Patient states that she had gone outside to start her car early before work and was walking up the steps from outside back into her home when she tripped on the bottom step causing herself to fall forward hitting her head on the railing as well as catching herself with her right wrist and falling onto her left shoulder. Patient states that on impact she "blacked out" for seconds and since then has felt intermittently dizzy today. Patient states she has had gradual onset headache with increasing headache throughout the day. Patient states that she got up from the steps and went on into work today. Patient works at a daycare. Patient states that despite being at work she's had persistent headache all day. Patient states pain in right wrist and left shoulder are "sore" that she has been moving them without difficulty today, picking up children, and ambulating without difficulty. Patient has a history of asthma and uses an at-home inhaler as needed, albuterol. Patient states that while waiting in the emergency department to be seen she has had gradual onset wheezing. Patient denies any shortness of breath or chest pain. Patient states wheezing is similar to her intermittent asthma and is requesting a breathing treatment.  Past Medical History  Diagnosis Date  . GERD   . HYPERTENSION   . ALLERGIC RHINITIS   . ANEMIA-NOS   . ANXIETY   . ASTHMA   . HYPOTHYROIDISM   . HYPERGLYCEMIA, BORDERLINE   . Personal history of colonic polyps 2011    last colonscopy 2011: no polyps.    Past Surgical History    Procedure Date  . Myomectomy 1997  . Breast surgery 2004    Lumpectomy, right side-Benign  . Thyroidectomy, partial 2004    Left, benign  . Vein surgery 1997    left arm  . Foot surgery 2006    right  . Hernia repair 1995    [facility] ?[place],    Family History  Problem Relation Age of Onset  . Hypertension Mother   . Osteoarthritis Mother   . Diabetes Sister   . Arthritis Other   . Pancreatic cancer Other   . Diabetes Other     History  Substance Use Topics  . Smoking status: Current Everyday Smoker -- 0.0 packs/day    Types: Cigarettes  . Smokeless tobacco: Not on file   Comment: Married 2009-lives with spouse, no kids  . Alcohol Use: No    OB History    Grav Para Term Preterm Abortions TAB SAB Ect Mult Living                  Review of Systems  All other systems reviewed and are negative.    Allergies  Naproxen  Home Medications   Current Outpatient Rx  Name Route Sig Dispense Refill  . ALBUTEROL SULFATE HFA 108 (90 BASE) MCG/ACT IN AERS Inhalation Inhale 2 puffs into the lungs 4 (four) times daily as needed. For shortness of breath    . ALPRAZOLAM 0.5 MG PO TABS Oral Take 0.5 mg by mouth 3 (three) times daily as needed. For anxiety    .  B COMPLEX PO Oral Take 1 tablet by mouth daily.     . BECLOMETHASONE DIPROPIONATE 40 MCG/ACT IN AERS Inhalation Inhale 2 puffs into the lungs 2 (two) times daily. 1 Inhaler 6  . BUSPIRONE HCL 10 MG PO TABS Oral Take 2 tablets (20 mg total) by mouth 2 (two) times daily. 120 tablet 6  . CITALOPRAM HYDROBROMIDE 40 MG PO TABS Oral Take 1 tablet (40 mg total) by mouth daily. 30 tablet 11  . DICLOFENAC SODIUM 1 % TD GEL Topical Apply 1 application topically 3 (three) times daily. Apply to painful area on right foot three times a day as needed    . FLUTICASONE PROPIONATE 50 MCG/ACT NA SUSP Nasal Place 2 sprays into the nose every morning. 16 g 2  . LISINOPRIL-HYDROCHLOROTHIAZIDE 10-12.5 MG PO TABS Oral Take 1 tablet by mouth  daily. 30 tablet 5    BP 144/87  Temp(Src) 98 F (36.7 C) (Oral)  Resp 18  SpO2 100%  Physical Exam  Nursing note and vitals reviewed. Constitutional: She is oriented to person, place, and time. She appears well-developed and well-nourished. No distress.  HENT:  Head: Normocephalic and atraumatic.  Eyes: Conjunctivae and EOM are normal. Pupils are equal, round, and reactive to light.  Neck: Normal range of motion. Neck supple.  Cardiovascular: Normal rate, regular rhythm, normal heart sounds and intact distal pulses.  Exam reveals no gallop and no friction rub.   No murmur heard. Pulmonary/Chest: Effort normal and breath sounds normal. No respiratory distress. She has no wheezes. She has no rales. She exhibits no tenderness.  Abdominal: Bowel sounds are normal. She exhibits no distension and no mass. There is no tenderness. There is no rebound and no guarding.  Musculoskeletal: Normal range of motion. She exhibits tenderness. She exhibits no edema.       Mild tenderness to palpation of left shoulder, right wrist, and left knee however full range of motion without crepitus and 5 out of 5 strength. No breaking skin and no deformity or swelling. Full range of motion of neck without any pain and no soft tissue tenderness to palpation. Entire back is nontender to palpation.  Neurological: She is alert and oriented to person, place, and time. She has normal reflexes.  Skin: Skin is warm and dry. No rash noted. She is not diaphoretic. No erythema.  Psychiatric: She has a normal mood and affect.    ED Course  Procedures (including critical care time)  Neb albuterol/atrovent.  IM fentanyl  Labs Reviewed - No data to display Ct Head Wo Contrast  05/29/2011  *RADIOLOGY REPORT*  Clinical Data: Status post fall down stairs; hit head.  Loss of consciousness.  Headache.  CT HEAD WITHOUT CONTRAST  Technique:  Contiguous axial images were obtained from the base of the skull through the vertex  without contrast.  Comparison: CT of the head performed 05/04/2011  Findings: There is no evidence of acute infarction, mass lesion, or intra- or extra-axial hemorrhage on CT.  The posterior fossa, including the cerebellum, brainstem and fourth ventricle, is within normal limits.  The third and lateral ventricles, and basal ganglia are unremarkable in appearance.  The cerebral hemispheres are symmetric in appearance, with normal gray- white differentiation.  No mass effect or midline shift is seen.  There is no evidence of fracture; visualized osseous structures are unremarkable in appearance.  The orbits are within normal limits. There is opacification of the diminutive left side of the sphenoid sinus, and mild mucosal thickening within  the left maxillary sinus; the remaining paranasal sinuses and mastoid air cells are well- aerated.  No significant soft tissue abnormalities are seen.  IMPRESSION:  1.  No evidence of traumatic intracranial injury or fracture. 2.  Opacification of the diminutive left side of the sphenoid sinus, and mild mucosal thickening within the left maxillary sinus.  Original Report Authenticated By: Tonia Ghent, M.D.     1. Headache   2. Dizziness   3. Minor head injury     Wheezing completely resolved after neb treatment   Date: 05/29/2011  Rate: 74  Rhythm: normal sinus rhythm  QRS Axis: normal  Intervals: normal  ST/T Wave abnormalities: normal  Conduction Disutrbances:none  Narrative Interpretation:   Old EKG Reviewed: Non-provocative EKG compared to 02/08/2011.    MDM  Patient had mechanical fall today, striking her head however head is atraumatic and no acute findings on CT scan. Patient is ambulating without difficulty he has mild soreness to after mentioned joints however systems most consistent with sprain and contusion and no evidence of fracture. Patient has no neuro focal findings and is ambulating without difficulty. She denies nausea or vomiting. Her  neck and back have full range of motion without tenderness to palpation.        Jenness Corner, PA 05/29/11 2239  Jenness Corner, PA 05/29/11 [phone]

## 2011-05-29 NOTE — ED Notes (Signed)
Pt d/c home in NAD. Pt ambulated with quick steady gait. D/c instructions discussed and pt voiced understanding

## 2011-05-29 NOTE — ED Provider Notes (Signed)
Medical screening examination/treatment/procedure(s) were performed by non-physician practitioner and as supervising physician I was immediately available for consultation/collaboration.  Cayleigh Paull R. Nefi Musich, MD 05/29/11 2328 

## 2011-05-29 NOTE — ED Notes (Signed)
Pt sts feel down stairs today and hit head; pt sts he passed out; pt sts pain in left shoulder, left knee and right finger pain; pt alert at present c/o HA

## 2011-05-30 NOTE — Telephone Encounter (Signed)
Faxed script back to [facility]....05/30/11@10 :10am/LMB

## 2011-06-13 ENCOUNTER — Ambulatory Visit: Payer: Commercial Managed Care - PPO | Admitting: Internal Medicine

## 2011-06-13 DIAGNOSIS — Z0289 Encounter for other administrative examinations: Secondary | ICD-10-CM

## 2011-06-20 ENCOUNTER — Encounter: Payer: Self-pay | Admitting: *Deleted

## 2011-06-20 ENCOUNTER — Encounter: Payer: Self-pay | Admitting: Internal Medicine

## 2011-06-20 ENCOUNTER — Ambulatory Visit (INDEPENDENT_AMBULATORY_CARE_PROVIDER_SITE_OTHER): Payer: No Typology Code available for payment source | Admitting: Internal Medicine

## 2011-06-20 VITALS — BP 102/78 | HR 75 | Temp 98.6°F

## 2011-06-20 DIAGNOSIS — H9201 Otalgia, right ear: Secondary | ICD-10-CM

## 2011-06-20 DIAGNOSIS — H9209 Otalgia, unspecified ear: Secondary | ICD-10-CM

## 2011-06-20 DIAGNOSIS — J0100 Acute maxillary sinusitis, unspecified: Secondary | ICD-10-CM

## 2011-06-20 DIAGNOSIS — J069 Acute upper respiratory infection, unspecified: Secondary | ICD-10-CM

## 2011-06-20 MED ORDER — AZITHROMYCIN 250 MG PO TABS: ORAL_TABLET | ORAL | Status: AC

## 2011-06-20 MED ORDER — HYDROCODONE-HOMATROPINE 5-1.5 MG/5ML PO SYRP: 5.0000 mL | ORAL_SOLUTION | Freq: Four times a day (QID) | ORAL | Status: DC | PRN

## 2011-06-20 MED ORDER — ANTIPYRINE-BENZOCAINE 5.4-1.4 % OT SOLN: 3.0000 [drp] | OTIC | Status: AC | PRN

## 2011-06-20 NOTE — Patient Instructions (Signed)
It was good to see you today. Zpak, ear drops and cough syrup - Your prescription(s) have been submitted to your pharmacy. Please take as directed and contact our office if you believe you are having problem(s) with the medication(s). tylenol as needed for aches and pains - Rest, stay hydrated and call if symptoms worse or unimproved after 10 days of treatment

## 2011-06-20 NOTE — Progress Notes (Signed)
  Subjective:    Patient ID: Wendy Carr, female    DOB: 22-Nov-1955, 56 y.o.   MRN: [id number]  HPI Complains of right ear pain Onset 2 days ago Precipitated by a coughing spasm -  No history of prior ear pain Cough is dry to scant white mucus Associated with maxillary sinus pressure, nasal drainage and sneezing Cough symptoms and ear pain symptoms worse at night Precipitated by sick contacts Associated with low-grade fever and mild headache  Past Medical History  Diagnosis Date  . GERD   . HYPERTENSION   . ALLERGIC RHINITIS   . ANEMIA-NOS   . ANXIETY   . ASTHMA   . HYPOTHYROIDISM   . HYPERGLYCEMIA, BORDERLINE   . Personal history of colonic polyps 2011    last colonscopy 2011: no polyps.     Review of Systems  Constitutional: Positive for chills and fatigue.  HENT: Positive for ear pain and congestion. Negative for hearing loss, neck pain, neck stiffness, tinnitus and ear discharge.   Respiratory: Positive for cough. Negative for shortness of breath and wheezing.   Cardiovascular: Negative for chest pain and palpitations.       Objective:   Physical Exam BP 102/78  Pulse 75  Temp(Src) 98.6 F (37 C) (Oral)  SpO2 98% Wt Readings from Last 3 Encounters:  05/03/11 238 lb (107.956 kg)  02/07/11 236 lb 1.9 oz (107.103 kg)  01/22/11 235 lb (106.595 kg)   Constitutional: She appears well-developed and well-nourished. No distress.  HENT: Head: Normocephalic and atraumatic; mild maxillary sinus tenderness to palpation bilaterally. Ears: L TM ok and R TM with redness but no erythema or effusion; Nose: Clear nasal discharge. Mouth/Throat: Oropharynx is clear and moist, PND clear. No oropharyngeal exudate.  Eyes: Conjunctivae and EOM are normal. Pupils are equal, round, and reactive to light. No scleral icterus.  Neck: Normal range of motion. Neck supple. No JVD present. No thyromegaly present.  Cardiovascular: Normal rate, regular rhythm and normal heart sounds.  No  murmur heard. No BLE edema. Pulmonary/Chest: Effort normal and breath sounds normal. No respiratory distress, but dry coughing. She has no wheezes.  Psychiatric: She has a normal mood and affect. Her behavior is normal. Judgment and thought content normal.   Lab Results  Component Value Date   WBC 5.7 05/03/2011   HGB 11.1* 05/03/2011   HCT 34.8* 05/03/2011   PLT 162.0 05/03/2011   GLUCOSE 126* 05/03/2011   CHOL 186 05/03/2011   TRIG 151.0* 05/03/2011   HDL 39.40 05/03/2011   LDLCALC 116* 05/03/2011   ALT 41* 05/03/2011   AST 38* 05/03/2011   NA 137 05/03/2011   K 4.0 05/03/2011   CL 102 05/03/2011   CREATININE 0.9 05/03/2011   BUN 18 05/03/2011   CO2 27 05/03/2011   TSH 0.82 05/03/2011   HGBA1C 6.3 05/03/2011        Assessment & Plan:

## 2011-06-21 ENCOUNTER — Telehealth: Payer: Self-pay

## 2011-06-21 MED ORDER — HYDROCODONE-HOMATROPINE 5-1.5 MG/5ML PO SYRP: 5.0000 mL | ORAL_SOLUTION | Freq: Four times a day (QID) | ORAL | Status: AC | PRN

## 2011-06-21 NOTE — Telephone Encounter (Signed)
Pt called stating she tripped over her dog and spilled cough syrup on the floor. Pt is requesting a new Rx, please advise

## 2011-06-21 NOTE — Telephone Encounter (Signed)
ok 

## 2011-06-21 NOTE — Telephone Encounter (Signed)
Pt advised of Rx/pharmacy 

## 2011-07-04 ENCOUNTER — Other Ambulatory Visit: Payer: Self-pay | Admitting: Internal Medicine

## 2011-07-04 NOTE — Telephone Encounter (Signed)
Faxed script back to [facility] @ [phone]... 07/05/11@1 :39pm/LMB

## 2011-07-17 ENCOUNTER — Other Ambulatory Visit: Payer: Self-pay | Admitting: *Deleted

## 2011-07-17 MED ORDER — ALBUTEROL SULFATE HFA 108 (90 BASE) MCG/ACT IN AERS: 2.0000 | INHALATION_SPRAY | Freq: Four times a day (QID) | RESPIRATORY_TRACT | Status: DC | PRN

## 2011-07-21 ENCOUNTER — Ambulatory Visit (INDEPENDENT_AMBULATORY_CARE_PROVIDER_SITE_OTHER): Payer: 59 | Admitting: Family Medicine

## 2011-07-21 DIAGNOSIS — I10 Essential (primary) hypertension: Secondary | ICD-10-CM

## 2011-07-21 DIAGNOSIS — K047 Periapical abscess without sinus: Secondary | ICD-10-CM

## 2011-07-21 DIAGNOSIS — J45909 Unspecified asthma, uncomplicated: Secondary | ICD-10-CM

## 2011-07-21 MED ORDER — MOMETASONE FURO-FORMOTEROL FUM 200-5 MCG/ACT IN AERO: 1.0000 | INHALATION_SPRAY | Freq: Two times a day (BID) | RESPIRATORY_TRACT | Status: DC

## 2011-07-21 MED ORDER — PREDNISONE 20 MG PO TABS: ORAL_TABLET | ORAL | Status: AC

## 2011-07-21 MED ORDER — IPRATROPIUM BROMIDE 0.02 % IN SOLN
0.5000 mg | Freq: Once | RESPIRATORY_TRACT | Status: AC
  Administered 2011-07-21: 0.5 mg via RESPIRATORY_TRACT

## 2011-07-21 MED ORDER — ALBUTEROL SULFATE (2.5 MG/3ML) 0.083% IN NEBU
2.5000 mg | INHALATION_SOLUTION | Freq: Once | RESPIRATORY_TRACT | Status: AC
  Administered 2011-07-21: 2.5 mg via RESPIRATORY_TRACT

## 2011-07-21 MED ORDER — SILVER SULFADIAZINE 1 % EX CREA: TOPICAL_CREAM | Freq: Every day | CUTANEOUS | Status: DC

## 2011-07-21 MED ORDER — OXYCODONE-ACETAMINOPHEN 2.5-325 MG PO TABS: 1.0000 | ORAL_TABLET | ORAL | Status: AC | PRN

## 2011-07-21 MED ORDER — ALBUTEROL SULFATE HFA 108 (90 BASE) MCG/ACT IN AERS: 1.0000 | INHALATION_SPRAY | Freq: Four times a day (QID) | RESPIRATORY_TRACT | Status: DC | PRN

## 2011-07-21 MED ORDER — CHLORHEXIDINE GLUCONATE 0.12 % MT SOLN: 15.0000 mL | Freq: Two times a day (BID) | OROMUCOSAL | Status: DC
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
## 2011-07-26 ENCOUNTER — Other Ambulatory Visit: Payer: Self-pay | Admitting: Internal Medicine

## 2011-08-04 ENCOUNTER — Emergency Department (HOSPITAL_COMMUNITY)
Admission: EM | Admit: 2011-08-04 | Discharge: 2011-08-04 | Disposition: A | Discharge status: Home / Self Care | Payer: 59 | Attending: Emergency Medicine | Admitting: Emergency Medicine

## 2011-08-04 ENCOUNTER — Encounter (HOSPITAL_COMMUNITY): Payer: Self-pay

## 2011-08-04 DIAGNOSIS — J45909 Unspecified asthma, uncomplicated: Secondary | ICD-10-CM | POA: Insufficient documentation

## 2011-08-04 DIAGNOSIS — K029 Dental caries, unspecified: Secondary | ICD-10-CM | POA: Insufficient documentation

## 2011-08-04 DIAGNOSIS — I10 Essential (primary) hypertension: Secondary | ICD-10-CM | POA: Insufficient documentation

## 2011-08-04 DIAGNOSIS — E039 Hypothyroidism, unspecified: Secondary | ICD-10-CM | POA: Insufficient documentation

## 2011-08-04 DIAGNOSIS — K219 Gastro-esophageal reflux disease without esophagitis: Secondary | ICD-10-CM | POA: Insufficient documentation

## 2011-08-04 DIAGNOSIS — D649 Anemia, unspecified: Secondary | ICD-10-CM | POA: Insufficient documentation

## 2011-08-04 DIAGNOSIS — F17200 Nicotine dependence, unspecified, uncomplicated: Secondary | ICD-10-CM | POA: Insufficient documentation

## 2011-08-04 DIAGNOSIS — L02419 Cutaneous abscess of limb, unspecified: Secondary | ICD-10-CM | POA: Insufficient documentation

## 2011-08-04 DIAGNOSIS — K0889 Other specified disorders of teeth and supporting structures: Secondary | ICD-10-CM

## 2011-08-04 DIAGNOSIS — L03119 Cellulitis of unspecified part of limb: Secondary | ICD-10-CM | POA: Insufficient documentation

## 2011-08-04 MED ORDER — CEPHALEXIN 500 MG PO CAPS: 500.0000 mg | ORAL_CAPSULE | Freq: Three times a day (TID) | ORAL | Status: AC

## 2011-08-04 NOTE — ED Provider Notes (Signed)
Medical screening examination/treatment/procedure(s) were performed by non-physician practitioner and as supervising physician I was immediately available for consultation/collaboration.   Leigh-Ann Morgan Rennert, MD 08/04/11 0928 

## 2011-08-04 NOTE — ED Notes (Addendum)
Patient states she has a toothache in left upper tooth and has a red swollen area on the left leg that appears red and warm to touch. Patient states left leg pain constantly and denies any type insect bite to her knowledge and the area feel hard and tight. Patient also states she has a boil on glut area. Patient has hx of boils.

## 2011-08-04 NOTE — ED Provider Notes (Signed)
History     CSN: [id number]  Arrival date & time 08/04/11  0705   First MD Initiated Contact with Patient 08/04/11 [phone]      Chief Complaint  Patient presents with  . Leg Pain    (Consider location/radiation/quality/duration/timing/severity/associated sxs/prior treatment) HPI Comments: Patient presents with complaint of left lower leg pain in the area of redness and swelling that began 2 days ago. Patient has a history of boils but not in this area. She felt the pain a week ago however noticed the swelling 2 days ago. She denies fever or chills. No nausea/vomiting/diarrhea. She denies numbness, tingling, or weakness in her legs. There's been no drainage from this area.  Patient also complains of a similar area on her right buttock. This has not been draining.  Patient also complains of tooth pain in her left upper mouth. Patient has a remaining tooth that has been causing problems for some time. She states that she has been prescribed Percocet in the past but has not had any for at least 3 months. Patient states that she has not seen a dentist a year ago. She denies neck swelling or shortness of breath. She has been taking tramadol for her pain but this is not helping.   Review of patient records reveal a recent visit 2 weeks ago for tooth pain for which she was prescribed 30 Percocet tablets. At that time she stated that she had an upcoming dental appointment in 5 days. This prescription was confirmed by review of [place] controlled substance reporting database.  Patient is a 56 y.o. female presenting with leg pain. The history is provided by the patient.  Leg Pain  The incident occurred more than 2 days ago. There was no injury mechanism. The pain is present in the right ankle. The quality of the pain is described as aching. The pain is moderate. The pain has been constant since onset. Pertinent negatives include no numbness, no inability to bear weight, no loss of motion, no muscle  weakness, no loss of sensation and no tingling. The treatment provided no relief.    Past Medical History  Diagnosis Date  . GERD   . HYPERTENSION   . ALLERGIC RHINITIS   . ANEMIA-NOS   . ANXIETY   . ASTHMA   . HYPOTHYROIDISM   . HYPERGLYCEMIA, BORDERLINE   . Personal history of colonic polyps 2011    last colonscopy 2011: no polyps.    Past Surgical History  Procedure Date  . Myomectomy 1997  . Breast surgery 2004    Lumpectomy, right side-Benign  . Thyroidectomy, partial 2004    Left, benign  . Vein surgery 1997    left arm  . Foot surgery 2006    right  . Hernia repair 1995    [facility] ?[place]    Family History  Problem Relation Age of Onset  . Hypertension Mother   . Osteoarthritis Mother   . Diabetes Sister   . Arthritis Other   . Pancreatic cancer Other   . Diabetes Other     History  Substance Use Topics  . Smoking status: Current Everyday Smoker -- 0.0 packs/day    Types: Cigarettes  . Smokeless tobacco: Not on file   Comment: Married 2009-lives with spouse, no kids  . Alcohol Use: No    OB History    Grav Para Term Preterm Abortions TAB SAB Ect Mult Living  Review of Systems  Constitutional: Negative for fever.  HENT: Positive for dental problem. Negative for ear pain, sore throat, facial swelling, trouble swallowing and neck pain.   Respiratory: Negative for shortness of breath and stridor.   Gastrointestinal: Negative for nausea and vomiting.  Musculoskeletal: Positive for myalgias.  Skin: Positive for color change. Negative for wound.       Positive for abscess.  Neurological: Positive for headaches. Negative for tingling and numbness.  Hematological: Negative for adenopathy.    Allergies  Naproxen  Home Medications   Current Outpatient Rx  Name Route Sig Dispense Refill  . ALBUTEROL SULFATE HFA 108 (90 BASE) MCG/ACT IN AERS Inhalation Inhale 2 puffs into the lungs 4 (four) times daily as needed. For shortness of  breath 18 g 2  . ALPRAZOLAM 0.5 MG PO TABS  TAKE 1 TABLET BY MOUTH THREE TIMES DAILY AS NEEDED FOR SLEEP OR ANXIETY 30 tablet 3  . B COMPLEX PO Oral Take 1 tablet by mouth daily.     . BECLOMETHASONE DIPROPIONATE 40 MCG/ACT IN AERS Inhalation Inhale 2 puffs into the lungs 2 (two) times daily. 1 Inhaler 6  . BUSPIRONE HCL 10 MG PO TABS Oral Take 2 tablets (20 mg total) by mouth 2 (two) times daily. 120 tablet 6  . CITALOPRAM HYDROBROMIDE 40 MG PO TABS Oral Take 1 tablet (40 mg total) by mouth daily. 30 tablet 11  . LISINOPRIL-HYDROCHLOROTHIAZIDE 10-12.5 MG PO TABS Oral Take 1 tablet by mouth daily. 30 tablet 5  . TRAMADOL HCL 50 MG PO TABS  TAKE 1 TABLET BY MOUTH EVERY 6 HOURS AS NEEDED FOR PAIN. 60 tablet 1    BP 151/90  Pulse 76  Temp(Src) 97.4 F (36.3 C) (Oral)  Resp 18  SpO2 99%  Physical Exam  Nursing note and vitals reviewed. Constitutional: She is oriented to person, place, and time. She appears well-developed and well-nourished.  HENT:  Head: Normocephalic and atraumatic. No trismus in the jaw.  Right Ear: Tympanic membrane, external ear and ear canal normal.  Left Ear: Tympanic membrane, external ear and ear canal normal.  Nose: Nose normal.  Mouth/Throat: Uvula is midline, oropharynx is clear and moist and mucous membranes are normal. Abnormal dentition. Dental caries present. No dental abscesses or uvula swelling. No tonsillar abscesses.       Patient with L maxillary tooth pain and tenderness to palpation in area of single remaining premolar. No swelling or erythema noted on exam. Tooth is intact.   Eyes: Conjunctivae are normal.  Neck: Normal range of motion. Neck supple.       No neck swelling or Ludwig's angina  Musculoskeletal: She exhibits tenderness. She exhibits no edema.       Patient with a less than 1 cm area of induration with approximately 2 cm surrounding area of erythema on left anterior shin consistent with an infected hair follicle. Area has palpable  fluctuance. Patient has a small area that is not erythematous and not fluctuant on right buttock.  Lymphadenopathy:    She has no cervical adenopathy.  Neurological: She is alert and oriented to person, place, and time.  Skin: Skin is warm and dry.  Psychiatric: She has a normal mood and affect.    ED Course  Procedures (including critical care time)  Labs Reviewed - No data to display No results found.   1. Abscess of leg   2. Pain, dental     Patient seen and examined.  Vital signs reviewed and are  as follows: Filed Vitals:   08/04/11 0709  BP: 151/90  Pulse: 76  Temp: 97.4 F (36.3 C)  Resp: 18   INCISION AND DRAINAGE Performed by: Carolee Rota Consent: Verbal consent obtained. Risks and benefits: risks, benefits and alternatives were discussed Type: abscess  Body area: left anterior shin  Anesthesia: local infiltration  Local anesthetic: lidocaine 2% with epinephrine  Anesthetic total: 1 ml  Complexity: complex Blunt dissection to break up loculations  Drainage: purulent  Drainage amount: small  Packing material: none  Patient tolerance: Patient tolerated the procedure well with no immediate complications.  Area cleaned and bandaged by myself.      The patient requested oxycodone for pain. I explained to the patient that with the discrepancies in the history she gave, I would not be able to prescribe narcotic pain medications to her today. I encouraged her to followup with her primary care physician for discussion of this.  Patient counseled to take home tramadol as directed by her doctor, return with worsening facial or neck swelling, and to follow-up with his dentist as soon as possible.   The patient was urged to return to the Emergency Department urgently with worsening pain, swelling, expanding erythema especially if it streaks away from the affected area, fever, or if they have any other concerns.   The patient was instructed toreturn to  the Emergency Department or go to their PCP in 48 hours for a recheck if their symptoms are not entirely resolved. I encouraged patient to take entire course of antibiotics as directed.   The patient verbalized understanding and stated agreement with this plan.    MDM  Patient with abscess, drained. Keflex given due to mild surrounding cellulitis.   Tooth pain will require follow-up with dentist. Patient on ultram. No Ludwig's angina.   Suspect component of drug seeking behavior due to patient's dishonesty regarding narcotic medication use.         [place], [name] 08/04/11 [phone]

## 2011-08-04 NOTE — Discharge Instructions (Signed)
Please read and follow all provided instructions.  Your diagnoses today include:  1. Abscess of leg   2. Pain, dental    Tests performed today include:  Vital signs. See below for your results today.   Medications prescribed:   Keflex - antibiotic that kills skin bacteria  You have been prescribed an antibiotic medicine: take the entire course of medicine even if you are feeling better. Stopping early can cause the antibiotic not to work.  Take any prescribed medications only as directed.   Home care instructions:   Follow any educational materials contained in this packet  Continue tramadol at home for pain control  Follow-up instructions: Return to the Emergency Department in 48 hours for a recheck if your symptoms are not significantly improved.  Follow up with a dentist as soon as possible for your tooth pain.   Please follow-up with your primary care provider in the next 1 week for further evaluation of your symptoms. If you do not have a primary care doctor -- see below for referral information.   Return instructions:  Return to the Emergency Department if you have:  Fever  Worsening symptoms  Worsening pain  Worsening swelling  Redness of the skin that moves away from the affected area, especially if it streaks away from the affected area   Any other emergent concerns  Additional Information: If you have recurrent abscesses, try both the following. Use a Qtip to apply an over-the-counter antibiotic to the inside of your nostrils, twice a day for 5 days. Wash your body with over-the-counter Hibaclens once a day for one week and then once every two weeks. This can reduce the amount of bacterial on your skin that causes boils and lead to fewer boils. If you continue to have multiple or recurrent boils, you should see a dermatologist (skin doctor).   Your vital signs today were: BP 151/90  Pulse 76  Temp(Src) 97.4 F (36.3 C) (Oral)  Resp 18  SpO2 99% If  your blood pressure (BP) was elevated above 135/85 this visit, please have this repeated by your doctor within one month. -------------- No Primary Care Doctor Call [facility]  [phone] Other agencies that provide inexpensive medical care    [facility]  [phone]    [facility]  [phone]    [facility]  [phone]    [facility]  [phone]    [facility]  [phone]    [facility]  [phone] -------------- RESOURCE GUIDE:  Dental Problems  Patients with Medicaid: [facility] [phone] [address]                                            [phone] [address] Phone:  [phone]                                                   Phone:  [phone]  If unable to pay or uninsured, contact:  [facility] or [facility]. to become qualified for the adult dental clinic.  Chronic Pain Problems Contact [facility]  [facility]  [phone] Patients need to be referred by their primary care doctor.  Insufficient Money for Medicine Contact [employer]:  call "211" or [facility] [phone].  Psychological Services [facility]  [phone] [facility]  [phone] [facility]   [phone] (emergency services [phone])  Substance Abuse Resources [facility]  [phone] [facility] [phone] [facility] [place] [phone] Floydene Flock [phone] Residential & Outpatient Substance Abuse Program  [phone]  Abuse/Neglect [facility] Child Abuse Hotline 236-315-6184 [facility] Child Abuse Hotline [phone])404-0501 (After Hours)  Emergency Shelter [facility] Minist[facility] [phone]  Maternity Homes [facility] [place] [facility] [phone] [facility] [phone]  [facility] Resources  [facility]     [employer]                           [facility] Dept. [address] [place]                       [address]      [address]  Blondell Reveal Phone:  [phone]                                   Phone:  [phone]                 Phone:  [phone]  [facility] Phone:  [phone]  [facility] Child Abuse Hotline [phone] (After Hours)

## 2011-08-04 NOTE — ED Notes (Signed)
Pt. Reports having dental pain lt. Upper tooth, and also has a red area on her lt. Lower leg which is swollen and painful

## 2011-08-06 ENCOUNTER — Other Ambulatory Visit: Payer: Self-pay | Admitting: Internal Medicine

## 2011-08-13 ENCOUNTER — Other Ambulatory Visit: Payer: Self-pay | Admitting: *Deleted

## 2011-08-13 MED ORDER — ALPRAZOLAM 0.5 MG PO TABS: 0.5000 mg | ORAL_TABLET | Freq: Three times a day (TID) | ORAL | Status: DC | PRN

## 2011-08-13 NOTE — Telephone Encounter (Signed)
Faxed script back to [facility]... 08/13/11@3 ;46pm/LMB

## 2011-08-28 ENCOUNTER — Other Ambulatory Visit: Payer: Self-pay | Admitting: Internal Medicine

## 2011-09-10 ENCOUNTER — Telehealth: Payer: Self-pay | Admitting: *Deleted

## 2011-09-10 MED ORDER — TRAZODONE HCL 50 MG PO TABS: 50.0000 mg | ORAL_TABLET | Freq: Every day | ORAL | Status: DC

## 2011-09-10 NOTE — Telephone Encounter (Signed)
Received paper request want renewal on trazodone 50 mg. Last filled 08/13/11. Is this ok to refill?... 5/.6/13@11 :43am/LMB

## 2011-09-10 NOTE — Telephone Encounter (Signed)
Sent trazodone to pharmacy... 09/10/11@1 :38pm/LMB

## 2011-09-10 NOTE — Telephone Encounter (Signed)
ok 

## 2011-09-18 ENCOUNTER — Other Ambulatory Visit: Payer: Self-pay | Admitting: *Deleted

## 2011-09-18 MED ORDER — ALPRAZOLAM 0.5 MG PO TABS: 0.5000 mg | ORAL_TABLET | Freq: Three times a day (TID) | ORAL | Status: DC | PRN

## 2011-09-19 NOTE — Telephone Encounter (Signed)
Faxed script back to [facility]... 09/20/11@8 :18am/LMB

## 2011-10-02 ENCOUNTER — Other Ambulatory Visit: Payer: Self-pay | Admitting: Internal Medicine

## 2011-10-23 ENCOUNTER — Other Ambulatory Visit: Payer: Self-pay | Admitting: Internal Medicine

## 2011-11-02 ENCOUNTER — Other Ambulatory Visit: Payer: Self-pay | Admitting: Internal Medicine

## 2011-11-06 ENCOUNTER — Other Ambulatory Visit: Payer: Self-pay | Admitting: Internal Medicine

## 2011-11-28 ENCOUNTER — Other Ambulatory Visit: Payer: Self-pay | Admitting: Internal Medicine

## 2011-11-28 NOTE — Telephone Encounter (Signed)
Faxed script back to [facility]... 11/28/11@9 :53am/LMB

## 2011-12-16 ENCOUNTER — Emergency Department (HOSPITAL_COMMUNITY): Payer: 59

## 2011-12-16 ENCOUNTER — Encounter (HOSPITAL_COMMUNITY): Payer: Self-pay | Admitting: Emergency Medicine

## 2011-12-16 ENCOUNTER — Inpatient Hospital Stay (HOSPITAL_COMMUNITY)
Admission: EM | Admit: 2011-12-16 | Discharge: 2011-12-19 | DRG: 557 | Disposition: A | Discharge status: Rehabilitation Facility | Payer: 59 | Attending: Internal Medicine | Admitting: Internal Medicine

## 2011-12-16 DIAGNOSIS — F411 Generalized anxiety disorder: Secondary | ICD-10-CM | POA: Diagnosis present

## 2011-12-16 DIAGNOSIS — F1120 Opioid dependence, uncomplicated: Secondary | ICD-10-CM | POA: Diagnosis present

## 2011-12-16 DIAGNOSIS — F3289 Other specified depressive episodes: Secondary | ICD-10-CM | POA: Diagnosis present

## 2011-12-16 DIAGNOSIS — J309 Allergic rhinitis, unspecified: Secondary | ICD-10-CM

## 2011-12-16 DIAGNOSIS — M549 Dorsalgia, unspecified: Secondary | ICD-10-CM

## 2011-12-16 DIAGNOSIS — D649 Anemia, unspecified: Secondary | ICD-10-CM

## 2011-12-16 DIAGNOSIS — F17200 Nicotine dependence, unspecified, uncomplicated: Secondary | ICD-10-CM | POA: Diagnosis present

## 2011-12-16 DIAGNOSIS — F1123 Opioid dependence with withdrawal: Secondary | ICD-10-CM | POA: Diagnosis present

## 2011-12-16 DIAGNOSIS — I498 Other specified cardiac arrhythmias: Secondary | ICD-10-CM | POA: Diagnosis present

## 2011-12-16 DIAGNOSIS — E039 Hypothyroidism, unspecified: Secondary | ICD-10-CM | POA: Diagnosis present

## 2011-12-16 DIAGNOSIS — R0781 Pleurodynia: Secondary | ICD-10-CM

## 2011-12-16 DIAGNOSIS — J45909 Unspecified asthma, uncomplicated: Secondary | ICD-10-CM | POA: Diagnosis present

## 2011-12-16 DIAGNOSIS — R001 Bradycardia, unspecified: Secondary | ICD-10-CM

## 2011-12-16 DIAGNOSIS — R748 Abnormal levels of other serum enzymes: Secondary | ICD-10-CM | POA: Diagnosis present

## 2011-12-16 DIAGNOSIS — F329 Major depressive disorder, single episode, unspecified: Secondary | ICD-10-CM | POA: Diagnosis present

## 2011-12-16 DIAGNOSIS — K219 Gastro-esophageal reflux disease without esophagitis: Secondary | ICD-10-CM | POA: Diagnosis present

## 2011-12-16 DIAGNOSIS — M6282 Rhabdomyolysis: Principal | ICD-10-CM | POA: Diagnosis present

## 2011-12-16 DIAGNOSIS — F1410 Cocaine abuse, uncomplicated: Secondary | ICD-10-CM | POA: Diagnosis present

## 2011-12-16 DIAGNOSIS — R7309 Other abnormal glucose: Secondary | ICD-10-CM

## 2011-12-16 DIAGNOSIS — F19939 Other psychoactive substance use, unspecified with withdrawal, unspecified: Secondary | ICD-10-CM | POA: Diagnosis present

## 2011-12-16 DIAGNOSIS — Z79899 Other long term (current) drug therapy: Secondary | ICD-10-CM

## 2011-12-16 DIAGNOSIS — G9349 Other encephalopathy: Secondary | ICD-10-CM | POA: Diagnosis present

## 2011-12-16 DIAGNOSIS — T50901A Poisoning by unspecified drugs, medicaments and biological substances, accidental (unintentional), initial encounter: Secondary | ICD-10-CM

## 2011-12-16 DIAGNOSIS — G9340 Encephalopathy, unspecified: Secondary | ICD-10-CM | POA: Diagnosis present

## 2011-12-16 DIAGNOSIS — I10 Essential (primary) hypertension: Secondary | ICD-10-CM | POA: Diagnosis present

## 2011-12-16 LAB — URINALYSIS, ROUTINE W REFLEX MICROSCOPIC
Nitrite: NEGATIVE
Protein, ur: NEGATIVE mg/dL
Urobilinogen, UA: 2 mg/dL — ABNORMAL HIGH (ref 0.0–1.0)

## 2011-12-16 LAB — COMPREHENSIVE METABOLIC PANEL
Albumin: 3.8 g/dL (ref 3.5–5.2)
BUN: 26 mg/dL — ABNORMAL HIGH (ref 6–23)
CO2: 25 mEq/L (ref 19–32)
Chloride: 106 mEq/L (ref 96–112)
Creatinine, Ser: 1.14 mg/dL — ABNORMAL HIGH (ref 0.50–1.10)
GFR calc non Af Amer: 54 mL/min — ABNORMAL LOW (ref >90)
Total Bilirubin: 0.3 mg/dL (ref 0.3–1.2)

## 2011-12-16 LAB — RAPID URINE DRUG SCREEN, HOSP PERFORMED: Opiates: POSITIVE — AB

## 2011-12-16 LAB — SALICYLATE LEVEL: Salicylate Lvl: <2 mg/dL — ABNORMAL LOW (ref 2.8–20.0)

## 2011-12-16 LAB — CK TOTAL AND CKMB (NOT AT ARMC): Total CK: 1255 U/L — ABNORMAL HIGH (ref 7–177)

## 2011-12-16 LAB — URINE MICROSCOPIC-ADD ON

## 2011-12-16 LAB — CBC WITH DIFFERENTIAL/PLATELET
Basophils Relative: 0 % (ref 0–1)
HCT: 36.6 % (ref 36.0–46.0)
Hemoglobin: 11.3 g/dL — ABNORMAL LOW (ref 12.0–15.0)
MCHC: 30.9 g/dL (ref 30.0–36.0)
MCV: 83 fL (ref 78.0–100.0)
Monocytes Absolute: 0.8 10*3/uL (ref 0.1–1.0)
Monocytes Relative: 9 % (ref 3–12)
Neutro Abs: 4.3 10*3/uL (ref 1.7–7.7)

## 2011-12-16 LAB — GLUCOSE, CAPILLARY

## 2011-12-16 LAB — ACETAMINOPHEN LEVEL: Acetaminophen (Tylenol), Serum: <15 ug/mL (ref 10–30)

## 2011-12-16 MED ORDER — LORAZEPAM 2 MG/ML IJ SOLN
2.0000 mg | Freq: Once | INTRAMUSCULAR | Status: AC
  Administered 2011-12-16: 2 mg via INTRAVENOUS
  Filled 2011-12-16: qty 1

## 2011-12-16 MED ORDER — IOHEXOL 350 MG/ML SOLN
100.0000 mL | Freq: Once | INTRAVENOUS | Status: AC | PRN
  Administered 2011-12-16: 100 mL via INTRAVENOUS

## 2011-12-16 MED ORDER — SODIUM CHLORIDE 0.9 % IV BOLUS (SEPSIS)
2000.0000 mL | Freq: Once | INTRAVENOUS | Status: AC
  Administered 2011-12-16: 2000 mL via INTRAVENOUS

## 2011-12-16 NOTE — ED Notes (Signed)
Placed fall risk bracelet and red socks on pt.  

## 2011-12-16 NOTE — ED Notes (Signed)
Pt states she had a tooth pulled about a week ago on L side, states took antibiotics but still having a lot of pain, also complaining of pain in R great toe, states "pressure". 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 Pt very restless in bed, anxious. Able to answer questions when asked.

## 2011-12-16 NOTE — H&P (Signed)
PCP:   Rene Paci, MD   Chief Complaint:  Mental Status change, and opiates and cocaine use  HPI: This is a 56 year old African American female with a history of hypertension, hypothyroidism, GERD, and a history of extensive drug use. who presents with a four-day history of opiates and cocaine abuse. The patient is unable to provide history to 2 acute encephalopathy. The history is obtained by Emergency department records and family. She is status post tooth extraction 3 weeks ago and had pain and tenderness in the extraction site. She began to take it herself with cocaine and heroin. The patient had been missing for several days and was just by her family today she was agitated and complaining of abdominal pain.  Review of Systems:  Unobtainable secondary to encephalopathy.  Past Medical History: Past Medical History  Diagnosis Date  . GERD   . HYPERTENSION   . ALLERGIC RHINITIS   . ANEMIA-NOS   . ANXIETY   . ASTHMA   . HYPOTHYROIDISM   . HYPERGLYCEMIA, BORDERLINE   . Personal history of colonic polyps 2011    last colonscopy 2011: no polyps.   Past Surgical History  Procedure Date  . Myomectomy 1997  . Breast surgery 2004    Lumpectomy, right side-Benign  . Thyroidectomy, partial 2004    Left, benign  . Vein surgery 1997    left arm  . Foot surgery 2006    right  . Hernia repair 1995    [facility] ?[place]    Medications: Prior to Admission medications   Medication Sig Start Date End Date Taking? Authorizing Provider  albuterol (PROVENTIL HFA;VENTOLIN HFA) 108 (90 BASE) MCG/ACT inhaler Inhale 2 puffs into the lungs 4 (four) times daily as needed. For shortness of breath 07/17/11  Yes Newt Lukes, MD  ALPRAZolam Prudy Feeler) 0.5 MG tablet Take 0.5 mg by mouth 3 (three) times daily as needed.   Yes Historical Provider, MD  B Complex Vitamins (B COMPLEX PO) Take 1 tablet by mouth daily.    Yes Historical Provider, MD  beclomethasone (QVAR) 40 MCG/ACT inhaler Inhale 2  puffs into the lungs 2 (two) times daily. 02/07/11  Yes Newt Lukes, MD  busPIRone (BUSPAR) 10 MG tablet Take 2 tablets (20 mg total) by mouth 2 (two) times daily. 02/07/11 02/07/12 Yes Newt Lukes, MD  citalopram (CELEXA) 40 MG tablet Take 1 tablet (40 mg total) by mouth daily. 02/07/11 02/07/12 Yes Newt Lukes, MD  ibuprofen (ADVIL,MOTRIN) 200 MG tablet Take 400 mg by mouth every 6 (six) hours as needed. For pain   Yes Historical Provider, MD  lisinopril-hydrochlorothiazide (PRINZIDE,ZESTORETIC) 10-12.5 MG per tablet Take 1 tablet by mouth daily.   Yes Historical Provider, MD  traMADol (ULTRAM) 50 MG tablet Take 50 mg by mouth every 6 (six) hours as needed.   Yes Historical Provider, MD    Allergies:   Allergies  Allergen Reactions  . Naproxen Other (See Comments)    Stomach cramps     Social History:  reports that she has been smoking Cigarettes.  She has been smoking about 0 packs per day. She does not have any smokeless tobacco history on file. She reports that she uses illicit drugs (Cocaine). She reports that she does not drink alcohol.  Family History: Family History  Problem Relation Age of Onset  . Hypertension Mother   . Osteoarthritis Mother   . Diabetes Sister   . Arthritis Other   . Pancreatic cancer Other   . Diabetes  Other     Physical Exam: Filed Vitals:   12/16/11 2019 12/16/11 2030 12/16/11 2045 12/16/11 2100  BP: 113/66 116/53  122/56  Pulse: 74 36 56 61  Temp:      TempSrc:      Resp: 22 24 24 22   SpO2: 95% 95% 95% 93%   General appearance: alert, cooperative and no distress Head: Normocephalic, without obvious abnormality, atraumatic Eyes: conjunctivae/corneas clear. PERRL, EOM's intact. Fundi benign. Ears: normal TM's and external ear canals both ears Throat: Left upper molars missing. No evidence of infection or abscess. Neck: no adenopathy, no carotid bruit, no JVD, supple, symmetrical, trachea midline and thyroid not enlarged,  symmetric, no tenderness/mass/nodules Resp: clear to auscultation bilaterally Cardio: regular rate and rhythm, S1, S2 normal, no murmur, click, rub or gallop GI: Tender to palpation particularly in the left upper and lower quadrants. No rebound tenderness or guarding. Extremities: extremities normal, atraumatic, no cyanosis or edema Pulses: 2+ and symmetric Lymph nodes: Cervical, supraclavicular, and axillary nodes normal. Neurologic: Alert and oriented X 3, normal strength and tone. Normal symmetric reflexes. Normal coordination and gait   Labs on Admission:   [facility] 12/16/11 1850  NA 142  K 3.3*  CL 106  CO2 25  GLUCOSE 111*  BUN 26*  CREATININE 1.14*  CALCIUM 9.0  MG --  PHOS --    Basename 12/16/11 1850  AST 59*  ALT 50*  ALKPHOS 84  BILITOT 0.3  PROT 7.5  ALBUMIN 3.8   No results found for this basename: LIPASE:2,AMYLASE:2 in the last 72 hours  Basename 12/16/11 1850  WBC 9.3  NEUTROABS 4.3  HGB 11.3*  HCT 36.6  MCV 83.0  PLT 146*    Basename 12/16/11 1850  CKTOTAL 1255*  CKMB 17.1*  CKMBINDEX --  TROPONINI <0.30   No results found for this basename: TSH,T4TOTAL,FREET3,T3FREE,THYROIDAB in the last 72 hours No results found for this basename: VITAMINB12:2,FOLATE:2,FERRITIN:2,TIBC:2,IRON:2,RETICCTPCT:2 in the last 72 hours  Radiological Exams on Admission: Ct Head Wo Contrast  12/16/2011  *RADIOLOGY REPORT*  Clinical Data: Combative, crack abuse, chest pain.  CT HEAD WITHOUT CONTRAST  Technique:  Contiguous axial images were obtained from the base of the skull through the vertex without contrast.  Comparison: 05/29/2011  Findings: There is no evidence of acute intracranial hemorrhage, brain edema, mass lesion, acute infarction,   mass effect, or midline shift. Acute infarct may be inapparent on noncontrast CT. No other intra-axial abnormalities are seen, and the ventricles and sulci are within normal limits in size and symmetry.   No abnormal extra-axial  fluid collections or masses are identified.  No significant calvarial abnormality.  IMPRESSION: 1. Negative for bleed or other acute intracranial process.  Original Report Authenticated By: Osa Craver, M.D.   Ct Angio Chest Pe W/cm &/or Wo Cm  12/16/2011  *RADIOLOGY REPORT*  Clinical Data: Chest pain, crack abuse.  CT ANGIOGRAPHY CHEST, ABDOMEN AND PELVIS  Technique:  Multidetector CT imaging through the chest, abdomen and pelvis was performed using the standard protocol during bolus administration of intravenous contrast.  Multiplanar reconstructed images including MIPs were obtained and reviewed to evaluate the vascular anatomy.  Contrast: OMNIPAQUE IOHEXOL 350 MG/ML SOLN  Comparison:  03/31/2007  CTA CHEST  Findings:  Negative for aortic dissection, aneurysm, or stenosis. There is classic three-vessel brachiocephalic arterial origin anatomy without proximal stenosis.  There is fairly good pulmonary arterial opacification without filling defect to suggest acute PE.  The study was not optimized for detection of  pulmonary emboli.  No pleural or pericardial effusion.  No hilar or mediastinal adenopathy.  Minimal dependent atelectasis posteriorly in the lower lobes.  Patient breathing during the acquisition degrades some of the images.  Minimal spondylitic changes in the mid thoracic spine. Sternum intact.   Review of the MIP images confirms the above findings.  IMPRESSION:  1.  Negative for acute PE or thoracic aortic dissection.  CT ANGIOGRAPHY ABDOMEN AND PELVIS  Arterial findings: Aorta:                  Negative for dissection, aneurysm, or stenosis.  There is some eccentric nonocclusive smooth mural thrombus in the suprarenal segment and scattered calcified plaque in the infrarenal segment.  Celiac axis:            Patent.  The right hepatic artery originates directly from the abdominal aorta, an anatomic variant.  Superior mesenteric:Widely patent  Left renal:             Duplicated,  inferior dominant.  There is mild plaque involving the ostium of the dominant inferior left renal artery resulting greater than 50 % diameter stenosis over a short segment less than 1 cm; vessel patent distally.  Right renal:            Single, patent  Inferior mesenteric:Patent  Left iliac:             Common iliac and visualized segments of the proximal external and internal iliac arteries widely patent, unremarkable.  Right iliac:            Common iliac and visualized proximal segments of external and internal iliac arteries widely patent.  Venous findings:  Venous phase imaging was not obtained.   Review of the MIP images confirms the above findings.  Nonvascular findings: Unremarkable arterial phase evaluation of the liver, spleen, adrenal glands, kidneys, pancreas.  Multiple noncalcified stones in the gallbladder with gas clefts noted. Stomach is nondilated.  Visualized segments of small bowel and colon are unremarkable.  No ascites.  No free air. 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  Facet disease at this level allows early anterolisthesis.  IMPRESSION: 1.  Negative for dissection or other acute abnormality. 2.  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 3.  Cholelithiasis.  Original Report Authenticated By: Osa Craver, M.D.   Dg Chest Portable 1 View  12/16/2011  *RADIOLOGY REPORT*  Clinical Data: Chest pain  PORTABLE CHEST - 1 VIEW  Comparison: 02/08/2011  Findings:  Low lung volumes.  Mild central pulmonary vascular congestion.  No focal consolidation.  No effusion.  Heart size upper limits normal for technique.  IMPRESSION:  1.  New central pulmonary vascular congestion. 2.  Borderline cardiomegaly.  Original Report Authenticated By: Osa Craver, M.D.   Ct Angio Abd/pel W/ And/or W/o  12/16/2011  *RADIOLOGY REPORT*  Clinical Data: Chest pain, crack abuse.  CT ANGIOGRAPHY CHEST, ABDOMEN AND PELVIS  Technique:   Multidetector CT imaging through the chest, abdomen and pelvis was performed using the standard protocol during bolus administration of intravenous contrast.  Multiplanar reconstructed images including MIPs were obtained and reviewed to evaluate the vascular anatomy.  Contrast: OMNIPAQUE IOHEXOL 350 MG/ML SOLN  Comparison:  03/31/2007  CTA CHEST  Findings:  Negative for aortic dissection, aneurysm, or stenosis. There is classic three-vessel brachiocephalic arterial origin anatomy without proximal stenosis.  There is fairly good pulmonary arterial opacification without filling defect to suggest acute PE.  The study was not optimized for detection of pulmonary emboli.  No pleural or pericardial effusion.  No hilar or mediastinal adenopathy.  Minimal dependent atelectasis posteriorly in the lower lobes.  Patient breathing during the acquisition degrades some of the images.  Minimal spondylitic changes in the mid thoracic spine. Sternum intact.   Review of the MIP images confirms the above findings.  IMPRESSION:  1.  Negative for acute PE or thoracic aortic dissection.  CT ANGIOGRAPHY ABDOMEN AND PELVIS  Arterial findings: Aorta:                  Negative for dissection, aneurysm, or stenosis.  There is some eccentric nonocclusive smooth mural thrombus in the suprarenal segment and scattered calcified plaque in the infrarenal segment.  Celiac axis:            Patent.  The right hepatic artery originates directly from the abdominal aorta, an anatomic variant.  Superior mesenteric:Widely patent  Left renal:             Duplicated, inferior dominant.  There is mild plaque involving the ostium of the dominant inferior left renal artery resulting greater than 50 % diameter stenosis over a short segment less than 1 cm; vessel patent distally.  Right renal:            Single, patent  Inferior mesenteric:Patent  Left iliac:             Common iliac and visualized segments of the proximal external and internal iliac  arteries widely patent, unremarkable.  Right iliac:            Common iliac and visualized proximal segments of external and internal iliac arteries widely patent.  Venous findings:  Venous phase imaging was not obtained.   Review of the MIP images confirms the above findings.  Nonvascular findings: Unremarkable arterial phase evaluation of the liver, spleen, adrenal glands, kidneys, pancreas.  Multiple noncalcified stones in the gallbladder with gas clefts noted. Stomach is nondilated.  Visualized segments of small bowel and colon are unremarkable.  No ascites.  No free air. 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  Facet disease at this level allows early anterolisthesis.  IMPRESSION: 1.  Negative for dissection or other acute abnormality. 2.  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 3.  Cholelithiasis.  Original Report Authenticated By: Osa Craver, M.D.    Assessment/Plan Present on Admission:  .Opiate withdrawal .Cocaine abuse .Elevated CK .Encephalopathy acute  1.  Elevated CK - admit to stepdown due to encephalopathy and elevated CK.  There is a question whether CK is elevated due to her injury versus mild rhabdomyolysis. We'll continue cardiac panel every 6 hours and follow progression. Creatinine is slightly elevated, however not sufficient for acute renal insufficiency. We'll start IV fluids for hydration. Continue telemetry.  #2 encephalopathy: Secondary to acute intoxication with opiates and cocaine.  We'll continue to support the patient clinically and wash for resolution. CT negative for any acute process.  #3 opiate withdrawal: Bentyl, clonidine, ativan prn.  #4 cocaine abuse: Negative for acute process thus far. We'll continue to rule out cardiac injury with serial enzymes.  #5 hypertension: Continue hydrochlorothiazide. Will hold lisinopril as patient has received IV contrast.  #6 GI prophylaxis: IV Protonix  #7  DVT prophylaxis: Lovenox  #8 Code status: Full code  STINSON, JACOB JEHIEL 12/16/2011, 10:08 PM

## 2011-12-16 NOTE — ED Notes (Signed)
Patient transported to CT 

## 2011-12-16 NOTE — ED Notes (Signed)
Pt states that she has been on a binge of $2000 worth of cocaine and marijuana.  Pt is restless in triage.  Tachypneic.  Daughter states that she just got a settlement of over $4000 and only came back with $2000.  The rest was spent on drugs.

## 2011-12-16 NOTE — ED Provider Notes (Signed)
History     CSN: [id number]  Arrival date & time 12/16/11  1831   First MD Initiated Contact with Patient 12/16/11 1853      Chief Complaint  Patient presents with  . Drug Problem    cocaine, Heroin    (Consider location/radiation/quality/duration/timing/severity/associated sxs/prior treatment) HPI Comments: Wendy Carr is a 56 y.o. Female hx of drug abuse, anxiety, asthma here with drug overdose. She had a tooth extraction 3 weeks ago but she noticed that the extraction site is painful and tender. She received some money last week and has been medicating herself with smoking cocaine and snorting heroin. As per family, she was missing for several days. Today, they found her and she was agitated and complaining of toothache, HA, chest and abdominal pain. No vomiting. She also has R big toe pain for several weeks but didn't notice any ulcers.    Level V caveat altered mental status.   The history is provided by the patient and a caregiver.    Past Medical History  Diagnosis Date  . GERD   . HYPERTENSION   . ALLERGIC RHINITIS   . ANEMIA-NOS   . ANXIETY   . ASTHMA   . HYPOTHYROIDISM   . HYPERGLYCEMIA, BORDERLINE   . Personal history of colonic polyps 2011    last colonscopy 2011: no polyps.    Past Surgical History  Procedure Date  . Myomectomy 1997  . Breast surgery 2004    Lumpectomy, right side-Benign  . Thyroidectomy, partial 2004    Left, benign  . Vein surgery 1997    left arm  . Foot surgery 2006    right  . Hernia repair 1995    [facility]    Family History  Problem Relation Age of Onset  . Hypertension Mother   . Osteoarthritis Mother   . Diabetes Sister   . Arthritis Other   . Pancreatic cancer Other   . Diabetes Other     History  Substance Use Topics  . Smoking status: Current Everyday Smoker -- 0.0 packs/day    Types: Cigarettes  . Smokeless tobacco: Not on file   Comment: Married 2009-lives with spouse, no kids  . Alcohol Use: No     OB History    Grav Para Term Preterm Abortions TAB SAB Ect Mult Living                  Review of Systems  HENT:       Dental pain  Respiratory: Positive for chest tightness.   Gastrointestinal: Positive for abdominal pain.  Neurological: Positive for headaches.  All other systems reviewed and are negative.    Allergies  Naproxen  Home Medications   Current Outpatient Rx  Name Route Sig Dispense Refill  . ALBUTEROL SULFATE HFA 108 (90 BASE) MCG/ACT IN AERS Inhalation Inhale 2 puffs into the lungs 4 (four) times daily as needed. For shortness of breath 18 g 2  . ALPRAZOLAM 0.5 MG PO TABS Oral Take 0.5 mg by mouth 3 (three) times daily as needed.    . B COMPLEX PO Oral Take 1 tablet by mouth daily.     . BECLOMETHASONE DIPROPIONATE 40 MCG/ACT IN AERS Inhalation Inhale 2 puffs into the lungs 2 (two) times daily. 1 Inhaler 6  . BUSPIRONE HCL 10 MG PO TABS Oral Take 2 tablets (20 mg total) by mouth 2 (two) times daily. 120 tablet 6  . CITALOPRAM HYDROBROMIDE 40 MG PO TABS Oral Take 1 tablet (40  mg total) by mouth daily. 30 tablet 11  . IBUPROFEN 200 MG PO TABS Oral Take 400 mg by mouth every 6 (six) hours as needed. For pain    . LISINOPRIL-HYDROCHLOROTHIAZIDE 10-12.5 MG PO TABS Oral Take 1 tablet by mouth daily.    . TRAMADOL HCL 50 MG PO TABS Oral Take 50 mg by mouth every 6 (six) hours as needed.      BP 122/56  Pulse 61  Temp 98.5 F (36.9 C) (Oral)  Resp 22  SpO2 93%  Physical Exam  Nursing note and vitals reviewed. Constitutional: She is oriented to person, place, and time.       Writhing around, agitated. Anxious.   HENT:  Head: Normocephalic.  Mouth/Throat:         There is poor dentition. There is a hole from the tooth extraction. No signs of gum swelling, no purulent discharge. There might be a segment of the tooth in the hole.   Eyes: Conjunctivae are normal. Pupils are equal, round, and reactive to light.  Neck: Normal range of motion. Neck supple.    Cardiovascular: Normal rate and regular rhythm.   Pulmonary/Chest: Breath sounds normal.       tachypneic  Abdominal: Soft. Bowel sounds are normal.  Musculoskeletal:       Moving all extremities. No edema. R big toe swollen but no ulcers.   Neurological: She is alert and oriented to person, place, and time.  Skin: Skin is warm.  Psychiatric:       Anxious, agitated.     ED Course  Procedures (including critical care time)  Labs Reviewed  CBC WITH DIFFERENTIAL - Abnormal; Notable for the following:    Hemoglobin 11.3 (*)     MCH 25.6 (*)     RDW 16.9 (*)     Platelets 146 (*)     All other components within normal limits  COMPREHENSIVE METABOLIC PANEL - Abnormal; Notable for the following:    Potassium 3.3 (*)     Glucose, Bld 111 (*)     BUN 26 (*)     Creatinine, Ser 1.14 (*)     AST 59 (*)     ALT 50 (*)     GFR calc non Af Amer 54 (*)     GFR calc Af Amer 62 (*)     All other components within normal limits  SALICYLATE LEVEL - Abnormal; Notable for the following:    Salicylate Lvl <2.0 (*)     All other components within normal limits  GLUCOSE, CAPILLARY - Abnormal; Notable for the following:    Glucose-Capillary 102 (*)     All other components within normal limits  CK TOTAL AND CKMB - Abnormal; Notable for the following:    Total CK 1255 (*)     CK, MB 17.1 (*)     All other components within normal limits  ETHANOL  ACETAMINOPHEN LEVEL  TROPONIN I  POCT PREGNANCY, URINE  URINALYSIS, ROUTINE W REFLEX MICROSCOPIC  URINE RAPID DRUG SCREEN (HOSP PERFORMED)   Ct Head Wo Contrast  12/16/2011  *RADIOLOGY REPORT*  Clinical Data: Combative, crack abuse, chest pain.  CT HEAD WITHOUT CONTRAST  Technique:  Contiguous axial images were obtained from the base of the skull through the vertex without contrast.  Comparison: 05/29/2011  Findings: There is no evidence of acute intracranial hemorrhage, brain edema, mass lesion, acute infarction,   mass effect, or midline shift.  Acute infarct may be inapparent on noncontrast CT.  No other intra-axial abnormalities are seen, and the ventricles and sulci are within normal limits in size and symmetry.   No abnormal extra-axial fluid collections or masses are identified.  No significant calvarial abnormality.  IMPRESSION: 1. Negative for bleed or other acute intracranial process.  Original Report Authenticated By: Osa Craver, M.D.   Ct Angio Chest Pe W/cm &/or Wo Cm  12/16/2011  *RADIOLOGY REPORT*  Clinical Data: Chest pain, crack abuse.  CT ANGIOGRAPHY CHEST, ABDOMEN AND PELVIS  Technique:  Multidetector CT imaging through the chest, abdomen and pelvis was performed using the standard protocol during bolus administration of intravenous contrast.  Multiplanar reconstructed images including MIPs were obtained and reviewed to evaluate the vascular anatomy.  Contrast: OMNIPAQUE IOHEXOL 350 MG/ML SOLN  Comparison:  03/31/2007  CTA CHEST  Findings:  Negative for aortic dissection, aneurysm, or stenosis. There is classic three-vessel brachiocephalic arterial origin anatomy without proximal stenosis.  There is fairly good pulmonary arterial opacification without filling defect to suggest acute PE.  The study was not optimized for detection of pulmonary emboli.  No pleural or pericardial effusion.  No hilar or mediastinal adenopathy.  Minimal dependent atelectasis posteriorly in the lower lobes.  Patient breathing during the acquisition degrades some of the images.  Minimal spondylitic changes in the mid thoracic spine. Sternum intact.   Review of the MIP images confirms the above findings.  IMPRESSION:  1.  Negative for acute PE or thoracic aortic dissection.  CT ANGIOGRAPHY ABDOMEN AND PELVIS  Arterial findings: Aorta:                  Negative for dissection, aneurysm, or stenosis.  There is some eccentric nonocclusive smooth mural thrombus in the suprarenal segment and scattered calcified plaque in the infrarenal segment.  Celiac  axis:            Patent.  The right hepatic artery originates directly from the abdominal aorta, an anatomic variant.  Superior mesenteric:Widely patent  Left renal:             Duplicated, inferior dominant.  There is mild plaque involving the ostium of the dominant inferior left renal artery resulting greater than 50 % diameter stenosis over a short segment less than 1 cm; vessel patent distally.  Right renal:            Single, patent  Inferior mesenteric:Patent  Left iliac:             Common iliac and visualized segments of the proximal external and internal iliac arteries widely patent, unremarkable.  Right iliac:            Common iliac and visualized proximal segments of external and internal iliac arteries widely patent.  Venous findings:  Venous phase imaging was not obtained.   Review of the MIP images confirms the above findings.  Nonvascular findings: Unremarkable arterial phase evaluation of the liver, spleen, adrenal glands, kidneys, pancreas.  Multiple noncalcified stones in the gallbladder with gas clefts noted. Stomach is nondilated.  Visualized segments of small bowel and colon are unremarkable.  No ascites.  No free air. 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  Facet disease at this level allows early anterolisthesis.  IMPRESSION: 1.  Negative for dissection or other acute abnormality. 2.  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 3.  Cholelithiasis.  Original Report Authenticated By: Osa Craver, M.D.   Dg Chest Portable 1 View  12/16/2011  *RADIOLOGY  REPORT*  Clinical Data: Chest pain  PORTABLE CHEST - 1 VIEW  Comparison: 02/08/2011  Findings:  Low lung volumes.  Mild central pulmonary vascular congestion.  No focal consolidation.  No effusion.  Heart size upper limits normal for technique.  IMPRESSION:  1.  New central pulmonary vascular congestion. 2.  Borderline cardiomegaly.  Original Report Authenticated By: Osa Craver, M.D.   Ct Angio Abd/pel W/ And/or W/o  12/16/2011  *RADIOLOGY REPORT*  Clinical Data: Chest pain, crack abuse.  CT ANGIOGRAPHY CHEST, ABDOMEN AND PELVIS  Technique:  Multidetector CT imaging through the chest, abdomen and pelvis was performed using the standard protocol during bolus administration of intravenous contrast.  Multiplanar reconstructed images including MIPs were obtained and reviewed to evaluate the vascular anatomy.  Contrast: OMNIPAQUE IOHEXOL 350 MG/ML SOLN  Comparison:  03/31/2007  CTA CHEST  Findings:  Negative for aortic dissection, aneurysm, or stenosis. There is classic three-vessel brachiocephalic arterial origin anatomy without proximal stenosis.  There is fairly good pulmonary arterial opacification without filling defect to suggest acute PE.  The study was not optimized for detection of pulmonary emboli.  No pleural or pericardial effusion.  No hilar or mediastinal adenopathy.  Minimal dependent atelectasis posteriorly in the lower lobes.  Patient breathing during the acquisition degrades some of the images.  Minimal spondylitic changes in the mid thoracic spine. Sternum intact.   Review of the MIP images confirms the above findings.  IMPRESSION:  1.  Negative for acute PE or thoracic aortic dissection.  CT ANGIOGRAPHY ABDOMEN AND PELVIS  Arterial findings: Aorta:                  Negative for dissection, aneurysm, or stenosis.  There is some eccentric nonocclusive smooth mural thrombus in the suprarenal segment and scattered calcified plaque in the infrarenal segment.  Celiac axis:            Patent.  The right hepatic artery originates directly from the abdominal aorta, an anatomic variant.  Superior mesenteric:Widely patent  Left renal:             Duplicated, inferior dominant.  There is mild plaque involving the ostium of the dominant inferior left renal artery resulting greater than 50 % diameter stenosis over a short segment less than 1 cm; vessel patent  distally.  Right renal:            Single, patent  Inferior mesenteric:Patent  Left iliac:             Common iliac and visualized segments of the proximal external and internal iliac arteries widely patent, unremarkable.  Right iliac:            Common iliac and visualized proximal segments of external and internal iliac arteries widely patent.  Venous findings:  Venous phase imaging was not obtained.   Review of the MIP images confirms the above findings.  Nonvascular findings: Unremarkable arterial phase evaluation of the liver, spleen, adrenal glands, kidneys, pancreas.  Multiple noncalcified stones in the gallbladder with gas clefts noted. Stomach is nondilated.  Visualized segments of small bowel and colon are unremarkable.  No ascites.  No free air. 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  Facet disease at this level allows early anterolisthesis.  IMPRESSION: 1.  Negative for dissection or other acute abnormality. 2.  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 3.  Cholelithiasis.  Original Report Authenticated By: Osa Craver, M.D.  No diagnosis found.  Date: 12/16/2011  Rate: 75  Rhythm: normal sinus rhythm  QRS Axis: normal  Intervals: normal  ST/T Wave abnormalities: TWI in V3 that is new  Conduction Disutrbances:none  Narrative Interpretation:   Old EKG Reviewed: changes noted     MDM  Wendy Carr is a 56 y.o. female hx of drug abuse here with overdose and agitation and HA, chest pain, abdominal pain. EKG had TWI V3, labs significant for CK 1255. CTangio chest/ab/pel didn't show any dissection or PE. Patient still altered. She will need admission for rhabdomyolysis and AMS.   9:46 PM Discussed with Dr. Adrian Blackwater, hospitalist, who accepted the patient to step down.         Richardean Canal, MD 12/16/11 2146

## 2011-12-16 NOTE — ED Notes (Addendum)
Attempted pt to void via female urinal, pt was unable to. RN lauren notified

## 2011-12-17 DIAGNOSIS — G9340 Encephalopathy, unspecified: Secondary | ICD-10-CM

## 2011-12-17 DIAGNOSIS — I498 Other specified cardiac arrhythmias: Secondary | ICD-10-CM

## 2011-12-17 DIAGNOSIS — F1410 Cocaine abuse, uncomplicated: Secondary | ICD-10-CM

## 2011-12-17 LAB — CARDIAC PANEL(CRET KIN+CKTOT+MB+TROPI)
CK, MB: 10.4 ng/mL (ref 0.3–4.0)
Relative Index: 1.5 (ref 0.0–2.5)
Relative Index: 1.6 (ref 0.0–2.5)
Troponin I: <0.3 ng/mL (ref <0.30)
Troponin I: <0.3 ng/mL (ref <0.30)

## 2011-12-17 LAB — BASIC METABOLIC PANEL
BUN: 16 mg/dL (ref 6–23)
CO2: 24 mEq/L (ref 19–32)
Chloride: 108 mEq/L (ref 96–112)
Creatinine, Ser: 0.73 mg/dL (ref 0.50–1.10)
GFR calc Af Amer: >90 mL/min (ref >90)
Glucose, Bld: 104 mg/dL — ABNORMAL HIGH (ref 70–99)
Potassium: 3 mEq/L — ABNORMAL LOW (ref 3.5–5.1)

## 2011-12-17 MED ORDER — BUSPIRONE HCL 10 MG PO TABS
20.0000 mg | ORAL_TABLET | Freq: Two times a day (BID) | ORAL | Status: DC
  Administered 2011-12-17 – 2011-12-19 (×6): 20 mg via ORAL
  Filled 2011-12-17 (×7): qty 2

## 2011-12-17 MED ORDER — ONDANSETRON HCL 4 MG/2ML IJ SOLN: 4.0000 mg | Freq: Four times a day (QID) | INTRAMUSCULAR | Status: DC | PRN

## 2011-12-17 MED ORDER — HYDROCHLOROTHIAZIDE 25 MG PO TABS
25.0000 mg | ORAL_TABLET | Freq: Every day | ORAL | Status: DC
  Administered 2011-12-17 – 2011-12-19 (×3): 25 mg via ORAL
  Filled 2011-12-17 (×3): qty 1

## 2011-12-17 MED ORDER — DICYCLOMINE HCL 20 MG PO TABS
20.0000 mg | ORAL_TABLET | Freq: Four times a day (QID) | ORAL | Status: DC | PRN
  Filled 2011-12-17: qty 1

## 2011-12-17 MED ORDER — CITALOPRAM HYDROBROMIDE 40 MG PO TABS
40.0000 mg | ORAL_TABLET | Freq: Every day | ORAL | Status: DC
  Administered 2011-12-17 – 2011-12-19 (×3): 40 mg via ORAL
  Filled 2011-12-17 (×3): qty 1

## 2011-12-17 MED ORDER — ALUM & MAG HYDROXIDE-SIMETH 200-200-20 MG/5ML PO SUSP: 30.0000 mL | Freq: Four times a day (QID) | ORAL | Status: DC | PRN

## 2011-12-17 MED ORDER — LORAZEPAM 2 MG/ML IJ SOLN
0.5000 mg | INTRAMUSCULAR | Status: DC | PRN
  Administered 2011-12-17 – 2011-12-19 (×10): 1 mg via INTRAVENOUS
  Filled 2011-12-17 (×13): qty 1

## 2011-12-17 MED ORDER — SODIUM CHLORIDE 0.9 % IJ SOLN
3.0000 mL | Freq: Two times a day (BID) | INTRAMUSCULAR | Status: DC
  Administered 2011-12-17: 01:00:00 via INTRAVENOUS
  Administered 2011-12-18: 3 mL via INTRAVENOUS
  Administered 2011-12-19: 10:00:00 via INTRAVENOUS

## 2011-12-17 MED ORDER — CLONIDINE HCL 0.1 MG PO TABS
0.1000 mg | ORAL_TABLET | Freq: Three times a day (TID) | ORAL | Status: DC | PRN
  Filled 2011-12-17: qty 1

## 2011-12-17 MED ORDER — ENOXAPARIN SODIUM 40 MG/0.4ML ~~LOC~~ SOLN
40.0000 mg | Freq: Every day | SUBCUTANEOUS | Status: DC
  Administered 2011-12-17 – 2011-12-18 (×3): 40 mg via SUBCUTANEOUS
  Filled 2011-12-17 (×4): qty 0.4

## 2011-12-17 MED ORDER — LORAZEPAM 2 MG/ML IJ SOLN
0.5000 mg | Freq: Three times a day (TID) | INTRAMUSCULAR | Status: DC | PRN
  Administered 2011-12-17: 0.5 mg via INTRAVENOUS
  Filled 2011-12-17: qty 1

## 2011-12-17 MED ORDER — ACETAMINOPHEN 650 MG RE SUPP: 650.0000 mg | Freq: Four times a day (QID) | RECTAL | Status: DC | PRN

## 2011-12-17 MED ORDER — ACETAMINOPHEN 325 MG PO TABS
650.0000 mg | ORAL_TABLET | Freq: Four times a day (QID) | ORAL | Status: DC | PRN
  Administered 2011-12-17 (×2): 650 mg via ORAL
  Filled 2011-12-17 (×2): qty 2

## 2011-12-17 MED ORDER — SODIUM CHLORIDE 0.9 % IV SOLN
INTRAVENOUS | Status: AC
  Administered 2011-12-17: via INTRAVENOUS

## 2011-12-17 MED ORDER — DOCUSATE SODIUM 100 MG PO CAPS
100.0000 mg | ORAL_CAPSULE | Freq: Every day | ORAL | Status: DC | PRN
  Filled 2011-12-17: qty 1

## 2011-12-17 MED ORDER — ONDANSETRON HCL 4 MG PO TABS: 4.0000 mg | ORAL_TABLET | Freq: Four times a day (QID) | ORAL | Status: DC | PRN

## 2011-12-17 MED ORDER — PANTOPRAZOLE SODIUM 40 MG IV SOLR
40.0000 mg | Freq: Every day | INTRAVENOUS | Status: DC
  Administered 2011-12-17 – 2011-12-18 (×3): 40 mg via INTRAVENOUS
  Filled 2011-12-17 (×5): qty 40

## 2011-12-17 MED ORDER — SODIUM CHLORIDE 0.9 % IV SOLN
INTRAVENOUS | Status: DC
  Administered 2011-12-17 – 2011-12-18 (×3): via INTRAVENOUS
  Administered 2011-12-18: 10 mL via INTRAVENOUS

## 2011-12-17 MED ORDER — FLUTICASONE PROPIONATE HFA 44 MCG/ACT IN AERO
1.0000 | INHALATION_SPRAY | Freq: Two times a day (BID) | RESPIRATORY_TRACT | Status: DC
  Administered 2011-12-17 – 2011-12-19 (×5): 1 via RESPIRATORY_TRACT
  Filled 2011-12-17: qty 10.6

## 2011-12-17 MED ORDER — POTASSIUM CHLORIDE 10 MEQ/100ML IV SOLN
10.0000 meq | INTRAVENOUS | Status: AC
  Administered 2011-12-17 (×4): 10 meq via INTRAVENOUS
  Filled 2011-12-17: qty 400

## 2011-12-17 NOTE — Progress Notes (Signed)
56 YO AAF admitted through ED to ICU/stepdown with opiate/cocaine/benzo overdose/ withdrawal. States she had a recent tooth extraction on the left lower with a significant amount of pain and began medicating herself with cocaine, sedatives, and opiates. Ativan 2mg  IV given in the ED, 0.5 mg ativan IV given in ICU. Behavior rotating between lethargic to highly agitated and impulsive (attempting to get OOB, pulling on lines, etc). Serial CKMBs drawn with routine labs. Pt being continuously watched by RN and bed alarm in place to protect pt form falls.

## 2011-12-17 NOTE — Progress Notes (Signed)
[facility] PROGRESS NOTE  Wendy Carr WGN:[id number] DOB: 1955-10-08 DOA: 12/16/2011 PCP: Rene Paci, MD  Assessment/Plan: Principal Problem:  *Elevated CK Active Problems:  Opiate withdrawal  Cocaine abuse  Encephalopathy acute hypokalemia 1. Elevated CK -  CK elevated likely mild rhabdomyolysis 2/2 to cocaine. Troponins negative We'll  -continue IV fluids for hydration. Continue telemetry.  #2 encephalopathy: Secondary to acute intoxication with, heroine, cocaine, opiates. We'll continue to support the patient clinically and wash for resolution. CT negative for any acute process. -clinically improved, psych consult  #3 opiate withdrawal: When necessary clonidine DC'd secondary pt's brady, Continue ativan prn.  #4 cocaine abuse/Heroine abuse We'll continue to rule out cardiac injury with serial enzymes.  -I. have consulted psychiatry patient is interested in a drug rehabilitation #5 bradycardia: Likely secondary to above, continue monitoring his step down unit for today will DC when necessary clonidine and follow. #6 hypertension: Continue hydrochlorothiazide. Will hold lisinopril as patient has received IV contrast. #7 Hypokalemia: Replace potassium Code Status: full Family Communication: no family available  Disposition Plan: to drug rehab vs home when stable   Brief narrative:   Consultants:  Psych. Consulted- awaiting eval  Procedures:  none  Antibiotics:  none  HPI/Subjective: Patient sleepy but easily aroused, alert and oriented x3. She states she is tired of doing drugs and wants rehabilitation.  Objective: Filed Vitals:   12/17/11 0400 12/17/11 0500 12/17/11 0700 12/17/11 0800  BP:  123/62 132/65   Pulse:  59 64 58  Temp: 97.7 F (36.5 C)   97.5 F (36.4 C)  TempSrc: Axillary   Axillary  Resp:   18 19  Height:      Weight:      SpO2:  99% 97% 99%    Intake/Output Summary (Last 24 hours) at 12/17/11 0907 Last data filed at 12/17/11  0800  Gross per 24 hour  Intake   3080 ml  Output    675 ml  Net   2405 ml   Filed Weights   12/17/11 0005  Weight: 99.7 kg (219 lb 12.8 oz)    Exam:   General: somnolent but easily aroused,orientedx3, in NAD  Cardiovascular: RRR, nl S1S2  Respiratory: Clear to auscultation bilaterally  Abdomen: , Bowel sounds present nontender and nondistended no organomegaly and no masses palpable  Data Reviewed: Basic Metabolic Panel:  Lab 12/17/11 7846 12/16/11 1850  NA 139 142  K 3.0* 3.3*  CL 108 106  CO2 24 25  GLUCOSE 104* 111*  BUN 16 26*  CREATININE 0.73 1.14*  CALCIUM 8.0* 9.0  MG -- --  PHOS -- --   Liver Function Tests:  Lab 12/16/11 1850  AST 59*  ALT 50*  ALKPHOS 84  BILITOT 0.3  PROT 7.5  ALBUMIN 3.8   No results found for this basename: LIPASE:5,AMYLASE:5 in the last 168 hours No results found for this basename: AMMONIA:5 in the last 168 hours CBC:  Lab 12/16/11 1850  WBC 9.3  NEUTROABS 4.3  HGB 11.3*  HCT 36.6  MCV 83.0  PLT 146*   Cardiac Enzymes:  Lab 12/17/11 0625 12/17/11 0020 12/16/11 1850  CKTOTAL 659* 931* 1255*  CKMB 10.4* 13.7* 17.1*  CKMBINDEX -- -- --  TROPONINI <0.30 <0.30 <0.30   BNP (last 3 results) No results found for this basename: PROBNP:3 in the last 8760 hours CBG:  Lab 12/16/11 1855  GLUCAP 102*    Recent Results (from the past 240 hour(s))  MRSA PCR SCREENING     Status:  Normal   Collection Time   12/17/11 12:15 AM      Component Value Range Status Comment   MRSA by PCR NEGATIVE  NEGATIVE Final      Studies: Ct Head Wo Contrast  12/16/2011  *RADIOLOGY REPORT*  Clinical Data: Combative, crack abuse, chest pain.  CT HEAD WITHOUT CONTRAST  Technique:  Contiguous axial images were obtained from the base of the skull through the vertex without contrast.  Comparison: 05/29/2011  Findings: There is no evidence of acute intracranial hemorrhage, brain edema, mass lesion, acute infarction,   mass effect, or midline  shift. Acute infarct may be inapparent on noncontrast CT. No other intra-axial abnormalities are seen, and the ventricles and sulci are within normal limits in size and symmetry.   No abnormal extra-axial fluid collections or masses are identified.  No significant calvarial abnormality.  IMPRESSION: 1. Negative for bleed or other acute intracranial process.  Original Report Authenticated By: Osa Craver, M.D.   Ct Angio Chest Pe W/cm &/or Wo Cm  12/16/2011  *RADIOLOGY REPORT*  Clinical Data: Chest pain, crack abuse.  CT ANGIOGRAPHY CHEST, ABDOMEN AND PELVIS  Technique:  Multidetector CT imaging through the chest, abdomen and pelvis was performed using the standard protocol during bolus administration of intravenous contrast.  Multiplanar reconstructed images including MIPs were obtained and reviewed to evaluate the vascular anatomy.  Contrast: OMNIPAQUE IOHEXOL 350 MG/ML SOLN  Comparison:  03/31/2007  CTA CHEST  Findings:  Negative for aortic dissection, aneurysm, or stenosis. There is classic three-vessel brachiocephalic arterial origin anatomy without proximal stenosis.  There is fairly good pulmonary arterial opacification without filling defect to suggest acute PE.  The study was not optimized for detection of pulmonary emboli.  No pleural or pericardial effusion.  No hilar or mediastinal adenopathy.  Minimal dependent atelectasis posteriorly in the lower lobes.  Patient breathing during the acquisition degrades some of the images.  Minimal spondylitic changes in the mid thoracic spine. Sternum intact.   Review of the MIP images confirms the above findings.  IMPRESSION:  1.  Negative for acute PE or thoracic aortic dissection.  CT ANGIOGRAPHY ABDOMEN AND PELVIS  Arterial findings: Aorta:                  Negative for dissection, aneurysm, or stenosis.  There is some eccentric nonocclusive smooth mural thrombus in the suprarenal segment and scattered calcified plaque in the infrarenal segment.   Celiac axis:            Patent.  The right hepatic artery originates directly from the abdominal aorta, an anatomic variant.  Superior mesenteric:Widely patent  Left renal:             Duplicated, inferior dominant.  There is mild plaque involving the ostium of the dominant inferior left renal artery resulting greater than 50 % diameter stenosis over a short segment less than 1 cm; vessel patent distally.  Right renal:            Single, patent  Inferior mesenteric:Patent  Left iliac:             Common iliac and visualized segments of the proximal external and internal iliac arteries widely patent, unremarkable.  Right iliac:            Common iliac and visualized proximal segments of external and internal iliac arteries widely patent.  Venous findings:  Venous phase imaging was not obtained.   Review of the MIP images confirms the  above findings.  Nonvascular findings: Unremarkable arterial phase evaluation of the liver, spleen, adrenal glands, kidneys, pancreas.  Multiple noncalcified stones in the gallbladder with gas clefts noted. Stomach is nondilated.  Visualized segments of small bowel and colon are unremarkable.  No ascites.  No free air. 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  Facet disease at this level allows early anterolisthesis.  IMPRESSION: 1.  Negative for dissection or other acute abnormality. 2.  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 3.  Cholelithiasis.  Original Report Authenticated By: Osa Craver, M.D.   Dg Chest Portable 1 View  12/16/2011  *RADIOLOGY REPORT*  Clinical Data: Chest pain  PORTABLE CHEST - 1 VIEW  Comparison: 02/08/2011  Findings:  Low lung volumes.  Mild central pulmonary vascular congestion.  No focal consolidation.  No effusion.  Heart size upper limits normal for technique.  IMPRESSION:  1.  New central pulmonary vascular congestion. 2.  Borderline cardiomegaly.  Original Report Authenticated By: Osa Craver, M.D.   Ct Angio Abd/pel W/ And/or W/o  12/16/2011  *RADIOLOGY REPORT*  Clinical Data: Chest pain, crack abuse.  CT ANGIOGRAPHY CHEST, ABDOMEN AND PELVIS  Technique:  Multidetector CT imaging through the chest, abdomen and pelvis was performed using the standard protocol during bolus administration of intravenous contrast.  Multiplanar reconstructed images including MIPs were obtained and reviewed to evaluate the vascular anatomy.  Contrast: OMNIPAQUE IOHEXOL 350 MG/ML SOLN  Comparison:  03/31/2007  CTA CHEST  Findings:  Negative for aortic dissection, aneurysm, or stenosis. There is classic three-vessel brachiocephalic arterial origin anatomy without proximal stenosis.  There is fairly good pulmonary arterial opacification without filling defect to suggest acute PE.  The study was not optimized for detection of pulmonary emboli.  No pleural or pericardial effusion.  No hilar or mediastinal adenopathy.  Minimal dependent atelectasis posteriorly in the lower lobes.  Patient breathing during the acquisition degrades some of the images.  Minimal spondylitic changes in the mid thoracic spine. Sternum intact.   Review of the MIP images confirms the above findings.  IMPRESSION:  1.  Negative for acute PE or thoracic aortic dissection.  CT ANGIOGRAPHY ABDOMEN AND PELVIS  Arterial findings: Aorta:                  Negative for dissection, aneurysm, or stenosis.  There is some eccentric nonocclusive smooth mural thrombus in the suprarenal segment and scattered calcified plaque in the infrarenal segment.  Celiac axis:            Patent.  The right hepatic artery originates directly from the abdominal aorta, an anatomic variant.  Superior mesenteric:Widely patent  Left renal:             Duplicated, inferior dominant.  There is mild plaque involving the ostium of the dominant inferior left renal artery resulting greater than 50 % diameter stenosis over a short segment less than 1 cm; vessel patent  distally.  Right renal:            Single, patent  Inferior mesenteric:Patent  Left iliac:             Common iliac and visualized segments of the proximal external and internal iliac arteries widely patent, unremarkable.  Right iliac:            Common iliac and visualized proximal segments of external and internal iliac arteries widely patent.  Venous findings:  Venous phase imaging was not obtained.  Review of the MIP images confirms the above findings.  Nonvascular findings: Unremarkable arterial phase evaluation of the liver, spleen, adrenal glands, kidneys, pancreas.  Multiple noncalcified stones in the gallbladder with gas clefts noted. Stomach is nondilated.  Visualized segments of small bowel and colon are unremarkable.  No ascites.  No free air. 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  Facet disease at this level allows early anterolisthesis.  IMPRESSION: 1.  Negative for dissection or other acute abnormality. 2.  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 3.  Cholelithiasis.  Original Report Authenticated By: Thora Lance III, M.D.    Scheduled Meds:   . sodium chloride   Intravenous STAT  . busPIRone  20 mg Oral BID  . citalopram  40 mg Oral Daily  . enoxaparin (LOVENOX) injection  40 mg Subcutaneous QHS  . fluticasone  1 puff Inhalation BID  . hydrochlorothiazide  25 mg Oral Daily  . LORazepam  2 mg Intravenous Once  . pantoprazole (PROTONIX) IV  40 mg Intravenous QHS  . sodium chloride  2,000 mL Intravenous Once  . sodium chloride  3 mL Intravenous Q12H   Continuous Infusions:   . sodium chloride 125 mL/hr at 12/17/11 0021    Principal Problem:  *Elevated CK Active Problems:  Opiate withdrawal  Cocaine abuse  Encephalopathy acute hypokalemia   Time spent:    [facility]  [facility] Pager [phone]. If 8PM-8AM, please contact night-coverage at [email or web address], password [facility] 12/17/2011, 9:07  AM  LOS: 1 day

## 2011-12-17 NOTE — Progress Notes (Signed)
CARE MANAGEMENT NOTE 12/17/2011  Patient:  Wendy Carr,Wendy Carr   Account Number:  [id number]  Date Initiated:  12/17/2011  Documentation initiated by:  DAVIS,RHONDA  Subjective/Objective Assessment:   patient with overdose of heroine and coccaine, lethgaric, decreased vital signs.     Action/Plan:   from home   Anticipated DC Date:  12/20/2011   Anticipated DC Plan:  HOME/SELF CARE  In-house referral  Clinical Social Worker      DC Planning Services  NA      [facility] Choice  NA   Choice offered to / List presented to:  NA   DME arranged  NA      DME agency  NA     HH arranged  NA      HH agency  NA   Status of service:  In process, will continue to follow Medicare Important Message given?  NA - LOS <3 / Initial given by admissions (If response is "NO", the following Medicare IM given date fields will be blank) Date Medicare IM given:   Date Additional Medicare IM given:    Discharge Disposition:    Per UR Regulation:  Reviewed for med. necessity/level of care/duration of stay  If discussed at Long Length of Stay Meetings, dates discussed:    Comments:  [id number] Earlene Plater, RN, BSN, CCM: CHART REVIEWED AND UPDATED. NO DISCHARGE NEEDS PRESENT AT THIS TIME. CASE MANAGEMENT [phone]

## 2011-12-17 NOTE — Progress Notes (Signed)
Clinical Social Work Department CLINICAL SOCIAL WORK PSYCHIATRY SERVICE LINE ASSESSMENT 12/17/2011  Patient:  Wendy Carr  Account:  [id number]  Admit Date:  12/16/2011  Clinical Social Worker:  Doroteo Glassman  Date/Time:  12/17/2011 04:14 PM Referred by:  Physician  Date referred:  12/17/2011 Reason for Referral  Behavioral Health Issues   Presenting Symptoms/Problems (In the person's/family's own words):   SA    Abuse/Neglect/Trauma Comments:   Psychiatric History (check all that apply)  Inpatient/hospitilization   Psychiatric medications:  Buspar, Celexa   Current Mental Health Hospitalizations/Previous Mental Health History:   Denied   Current provider:   Place and Date:   Current Medications:   Previous Impatient Admission/Date/Reason:   Pt received inpt SA tx at [facility] in [place] and the [employer] [employer] [facility] [employer] [facility] / Current Symptoms    Suicide/Self Harm  None reported   Suicide attempt in the past:   Other harmful behavior:   Psychotic/Dissociative Symptoms  None reported   Other Psychotic/Dissociative Symptoms:    Attention/Behavioral Symptoms  Within Normal Limits   Other Attention / Behavioral Symptoms:    Cognitive Impairment  Within Normal Limits   Other Cognitive Impairment:    Mood and Adjustment  Anxious    Stress, Anxiety, Trauma, Any Recent Loss/Stressor  None reported   Anxiety (frequency):   Phobia (specify):   Compulsive behavior (specify):   Obsessive behavior (specify):   Other:   Substance Abuse/Use  Substance abuse treatment needed   SBIRT completed (please refer for detailed history):    Self-reported substance use:   Urinary Drug Screen Completed:  Y Alcohol level:    Environmental/Housing/Living Arrangement  Stable housing   Who is in the home:   Husband   Emergency contact:  Husband   Personnel officer   Patient's Strengths and Goals (patient's own words):   Clinical  Social Worker's Interpretive Summary:   Met with Pt to discuss current admission.    Pt reports that she has been using crack for 26 years and that her longest period of sobriety was for 5 years.  She received inpt tx 7 and 5 years ago.    Pt reports that she received a settlement check and that she decided to go use.    Pt states that she uses crack and snorts heroin.    Pt works at [employer] and is concerned about losing her job.    Pt's husband present and supportive.    Gave Pt information on area inpt tx facilities.    Pt wanting [facility], however [facility] does not accept Pts who have ins.    Pt and husband to review tx information and make a decision.    CSW to meet with Pt tomorrow to discuss placement.    Pt's husband was at bedside and   Disposition:  Recommend Psych CSW continuing to support while in hospital  CSW to continue to follow.  Providence Crosby, LCSWA Clinical Social Work [phone]

## 2011-12-17 NOTE — Progress Notes (Signed)
NP notified of dropping K+ 3.0, orders pending. Report to Velna Hatchet, [place].

## 2011-12-18 ENCOUNTER — Other Ambulatory Visit: Payer: Self-pay | Admitting: Internal Medicine

## 2011-12-18 DIAGNOSIS — F1910 Other psychoactive substance abuse, uncomplicated: Secondary | ICD-10-CM

## 2011-12-18 LAB — BASIC METABOLIC PANEL
BUN: 7 mg/dL (ref 6–23)
Chloride: 109 mEq/L (ref 96–112)
Creatinine, Ser: 0.6 mg/dL (ref 0.50–1.10)
Glucose, Bld: 132 mg/dL — ABNORMAL HIGH (ref 70–99)
Potassium: 3.4 mEq/L — ABNORMAL LOW (ref 3.5–5.1)

## 2011-12-18 MED ORDER — BUSPIRONE HCL 10 MG PO TABS: 10.0000 mg | ORAL_TABLET | Freq: Two times a day (BID) | ORAL | Status: DC

## 2011-12-18 MED ORDER — LORAZEPAM 2 MG/ML IJ SOLN
1.0000 mg | INTRAMUSCULAR | Status: AC
  Administered 2011-12-18: 1 mg via INTRAVENOUS
  Filled 2011-12-18: qty 1

## 2011-12-18 MED ORDER — LISINOPRIL 10 MG PO TABS
10.0000 mg | ORAL_TABLET | Freq: Every day | ORAL | Status: DC
  Administered 2011-12-18 – 2011-12-19 (×2): 10 mg via ORAL
  Filled 2011-12-18 (×2): qty 1

## 2011-12-18 MED ORDER — NICOTINE 14 MG/24HR TD PT24
14.0000 mg | MEDICATED_PATCH | Freq: Every day | TRANSDERMAL | Status: DC
  Administered 2011-12-18 – 2011-12-19 (×2): 14 mg via TRANSDERMAL
  Filled 2011-12-18 (×2): qty 1

## 2011-12-18 MED ORDER — ALPRAZOLAM ER 0.5 MG PO TB24
0.5000 mg | ORAL_TABLET | Freq: Every day | ORAL | Status: DC
  Administered 2011-12-18 – 2011-12-19 (×2): 0.5 mg via ORAL
  Filled 2011-12-18 (×2): qty 1

## 2011-12-18 MED ORDER — HYDRALAZINE HCL 20 MG/ML IJ SOLN
10.0000 mg | Freq: Once | INTRAMUSCULAR | Status: AC
  Administered 2011-12-18: 23:00:00 via INTRAVENOUS
  Filled 2011-12-18: qty 1

## 2011-12-18 MED ORDER — POTASSIUM CHLORIDE CRYS ER 20 MEQ PO TBCR
40.0000 meq | EXTENDED_RELEASE_TABLET | Freq: Once | ORAL | Status: AC
  Administered 2011-12-18: 40 meq via ORAL
  Filled 2011-12-18: qty 2

## 2011-12-18 MED ORDER — HYDROCHLOROTHIAZIDE 12.5 MG PO CAPS: 12.5000 mg | ORAL_CAPSULE | Freq: Every day | ORAL | Status: DC

## 2011-12-18 NOTE — Progress Notes (Signed)
Spoke with Pt's husband who asked if CSW could meet with him and Pt to discuss d/c plans, as Pt's husband is interested in Pt going back to the [facility] [employer] [facility] [place].  To that end, Pt's husband contacted [facility] and expects to hear back from them soon.  CSW was under the impression, after meeting with Pt yesterday, that she did not want to go to the [facility].  CSW to meet with Pt and her husband when husband arrives.  CSW to continue to follow.  Providence Crosby, LCSWA Clinical Social Work [phone]

## 2011-12-18 NOTE — Progress Notes (Signed)
[facility] to contact CSW tomorrow at 9 to confirm bed availability.  If bed available, [facility] to arrive to pick up Pt at 1000.  Notified Pt and her husband.  Pt to gather all her meds for possible admission to facility tomorrow.  Providence Crosby, LCSWA Clinical Social Work [phone]

## 2011-12-18 NOTE — Progress Notes (Signed)
[facility] PROGRESS NOTE  Wendy Carr XBJ:[id number] DOB: 07-Jun-1955 DOA: 12/16/2011 PCP: Rene Paci, MD  Assessment/Plan: Principal Problem:  *Elevated CK Active Problems:  Opiate withdrawal  Cocaine abuse  Encephalopathy acute  Bradycardia hypokalemia 1. Elevated CK -  CK elevated likely mild rhabdomyolysis 2/2 to cocaine. Troponins negative  -ck down with ivf-will decrease to kvo #2 encephalopathy: Secondary to acute intoxication with, heroine, cocaine, opiates. We'll continue to support the patient clinically and wash for resolution. CT negative for any acute process. -clinically improved -appreciate psych consult -to rehab upon d/c and bed to be available in am #3 opiate withdrawal: When necessary clonidine DC'd secondary pt's brady, Continue ativan prn.  #4 cocaine abuse/Heroine abuse We'll continue to rule out cardiac injury with serial enzymes.  -as above, to rehab in am #5 bradycardia: Likely secondary to above, continue monitoring his step down unit for today will DC when necessary clonidine and follow. -resolved #6 hypertension: Continue hydrochlorothiazide. resume lisinopril cr wnl #7 Hypokalemia: Replace potassium Code Status: full Family Communication: no family available  Disposition Plan: to drug rehab vs home when stable   Brief narrative: This is a 56 year old African American female with a history of hypertension, hypothyroidism, GERD, and a history of extensive drug use admitted a four-day history of cocaine, heroine abuse. The patient had been missing for several days and was just by her family on day of admission- she was agitated and complaining of abdominal pain. UDS was + for cocaine, opiates and benzos.   Consultants:  Psych.   Procedures:  none  Antibiotics:  none  HPI/Subjective: Patient sleepy but easily aroused, alert and oriented x3. She states she is tired of doing drugs and wants rehabilitation.  Objective: Filed  Vitals:   12/17/11 2115 12/18/11 0622 12/18/11 0759 12/18/11 1357  BP: 152/72 159/73  156/69  Pulse: 65 63  62  Temp: 98.5 F (36.9 C) 98.7 F (37.1 C)  97.6 F (36.4 C)  TempSrc: Oral Oral  Oral  Resp: 18 18  20   Height:      Weight:      SpO2: 99% 100% 98% 99%    Intake/Output Summary (Last 24 hours) at 12/18/11 1529 Last data filed at 12/18/11 0940  Gross per 24 hour  Intake   1865 ml  Output   1500 ml  Net    365 ml   Filed Weights   12/17/11 0005  Weight: 99.7 kg (219 lb 12.8 oz)    Exam:   General: somnolent but easily aroused,orientedx3, in NAD  Cardiovascular: RRR, nl S1S2  Respiratory: Clear to auscultation bilaterally  Abdomen: , Bowel sounds present nontender and nondistended no organomegaly and no masses palpable  Data Reviewed: Basic Metabolic Panel:  Lab 12/18/11 3086 12/17/11 0625 12/16/11 1850  NA 140 139 142  K 3.4* 3.0* 3.3*  CL 109 108 106  CO2 25 24 25   GLUCOSE 132* 104* 111*  BUN 7 16 26*  CREATININE 0.60 0.73 1.14*  CALCIUM 8.1* 8.0* 9.0  MG -- -- --  PHOS -- -- --   Liver Function Tests:  Lab 12/16/11 1850  AST 59*  ALT 50*  ALKPHOS 84  BILITOT 0.3  PROT 7.5  ALBUMIN 3.8   No results found for this basename: LIPASE:5,AMYLASE:5 in the last 168 hours No results found for this basename: AMMONIA:5 in the last 168 hours CBC:  Lab 12/16/11 1850  WBC 9.3  NEUTROABS 4.3  HGB 11.3*  HCT 36.6  MCV 83.0  PLT  146*   Cardiac Enzymes:  Lab 12/18/11 0505 12/17/11 1201 12/17/11 0625 12/17/11 0020 12/16/11 1850  CKTOTAL 287* 566* 659* 931* 1255*  CKMB -- 8.5* 10.4* 13.7* 17.1*  CKMBINDEX -- -- -- -- --  TROPONINI -- <0.30 <0.30 <0.30 <0.30   BNP (last 3 results) No results found for this basename: PROBNP:3 in the last 8760 hours CBG:  Lab 12/16/11 1855  GLUCAP 102*    Recent Results (from the past 240 hour(s))  MRSA PCR SCREENING     Status: Normal   Collection Time   12/17/11 12:15 AM      Component Value Range  Status Comment   MRSA by PCR NEGATIVE  NEGATIVE Final      Studies: Ct Head Wo Contrast  12/16/2011  *RADIOLOGY REPORT*  Clinical Data: Combative, crack abuse, chest pain.  CT HEAD WITHOUT CONTRAST  Technique:  Contiguous axial images were obtained from the base of the skull through the vertex without contrast.  Comparison: 05/29/2011  Findings: There is no evidence of acute intracranial hemorrhage, brain edema, mass lesion, acute infarction,   mass effect, or midline shift. Acute infarct may be inapparent on noncontrast CT. No other intra-axial abnormalities are seen, and the ventricles and sulci are within normal limits in size and symmetry.   No abnormal extra-axial fluid collections or masses are identified.  No significant calvarial abnormality.  IMPRESSION: 1. Negative for bleed or other acute intracranial process.  Original Report Authenticated By: Osa Craver, M.D.   Ct Angio Chest Pe W/cm &/or Wo Cm  12/16/2011  *RADIOLOGY REPORT*  Clinical Data: Chest pain, crack abuse.  CT ANGIOGRAPHY CHEST, ABDOMEN AND PELVIS  Technique:  Multidetector CT imaging through the chest, abdomen and pelvis was performed using the standard protocol during bolus administration of intravenous contrast.  Multiplanar reconstructed images including MIPs were obtained and reviewed to evaluate the vascular anatomy.  Contrast: OMNIPAQUE IOHEXOL 350 MG/ML SOLN  Comparison:  03/31/2007  CTA CHEST  Findings:  Negative for aortic dissection, aneurysm, or stenosis. There is classic three-vessel brachiocephalic arterial origin anatomy without proximal stenosis.  There is fairly good pulmonary arterial opacification without filling defect to suggest acute PE.  The study was not optimized for detection of pulmonary emboli.  No pleural or pericardial effusion.  No hilar or mediastinal adenopathy.  Minimal dependent atelectasis posteriorly in the lower lobes.  Patient breathing during the acquisition degrades some of the  images.  Minimal spondylitic changes in the mid thoracic spine. Sternum intact.   Review of the MIP images confirms the above findings.  IMPRESSION:  1.  Negative for acute PE or thoracic aortic dissection.  CT ANGIOGRAPHY ABDOMEN AND PELVIS  Arterial findings: Aorta:                  Negative for dissection, aneurysm, or stenosis.  There is some eccentric nonocclusive smooth mural thrombus in the suprarenal segment and scattered calcified plaque in the infrarenal segment.  Celiac axis:            Patent.  The right hepatic artery originates directly from the abdominal aorta, an anatomic variant.  Superior mesenteric:Widely patent  Left renal:             Duplicated, inferior dominant.  There is mild plaque involving the ostium of the dominant inferior left renal artery resulting greater than 50 % diameter stenosis over a short segment less than 1 cm; vessel patent distally.  Right renal:  Single, patent  Inferior mesenteric:Patent  Left iliac:             Common iliac and visualized segments of the proximal external and internal iliac arteries widely patent, unremarkable.  Right iliac:            Common iliac and visualized proximal segments of external and internal iliac arteries widely patent.  Venous findings:  Venous phase imaging was not obtained.   Review of the MIP images confirms the above findings.  Nonvascular findings: Unremarkable arterial phase evaluation of the liver, spleen, adrenal glands, kidneys, pancreas.  Multiple noncalcified stones in the gallbladder with gas clefts noted. Stomach is nondilated.  Visualized segments of small bowel and colon are unremarkable.  No ascites.  No free air. 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  Facet disease at this level allows early anterolisthesis.  IMPRESSION: 1.  Negative for dissection or other acute abnormality. 2.  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 3.  Cholelithiasis.  Original  Report Authenticated By: Osa Craver, M.D.   Dg Chest Portable 1 View  12/16/2011  *RADIOLOGY REPORT*  Clinical Data: Chest pain  PORTABLE CHEST - 1 VIEW  Comparison: 02/08/2011  Findings:  Low lung volumes.  Mild central pulmonary vascular congestion.  No focal consolidation.  No effusion.  Heart size upper limits normal for technique.  IMPRESSION:  1.  New central pulmonary vascular congestion. 2.  Borderline cardiomegaly.  Original Report Authenticated By: Osa Craver, M.D.   Ct Angio Abd/pel W/ And/or W/o  12/16/2011  *RADIOLOGY REPORT*  Clinical Data: Chest pain, crack abuse.  CT ANGIOGRAPHY CHEST, ABDOMEN AND PELVIS  Technique:  Multidetector CT imaging through the chest, abdomen and pelvis was performed using the standard protocol during bolus administration of intravenous contrast.  Multiplanar reconstructed images including MIPs were obtained and reviewed to evaluate the vascular anatomy.  Contrast: OMNIPAQUE IOHEXOL 350 MG/ML SOLN  Comparison:  03/31/2007  CTA CHEST  Findings:  Negative for aortic dissection, aneurysm, or stenosis. There is classic three-vessel brachiocephalic arterial origin anatomy without proximal stenosis.  There is fairly good pulmonary arterial opacification without filling defect to suggest acute PE.  The study was not optimized for detection of pulmonary emboli.  No pleural or pericardial effusion.  No hilar or mediastinal adenopathy.  Minimal dependent atelectasis posteriorly in the lower lobes.  Patient breathing during the acquisition degrades some of the images.  Minimal spondylitic changes in the mid thoracic spine. Sternum intact.   Review of the MIP images confirms the above findings.  IMPRESSION:  1.  Negative for acute PE or thoracic aortic dissection.  CT ANGIOGRAPHY ABDOMEN AND PELVIS  Arterial findings: Aorta:                  Negative for dissection, aneurysm, or stenosis.  There is some eccentric nonocclusive smooth mural thrombus in  the suprarenal segment and scattered calcified plaque in the infrarenal segment.  Celiac axis:            Patent.  The right hepatic artery originates directly from the abdominal aorta, an anatomic variant.  Superior mesenteric:Widely patent  Left renal:             Duplicated, inferior dominant.  There is mild plaque involving the ostium of the dominant inferior left renal artery resulting greater than 50 % diameter stenosis over a short segment less than 1 cm; vessel patent distally.  Right renal:  Single, patent  Inferior mesenteric:Patent  Left iliac:             Common iliac and visualized segments of the proximal external and internal iliac arteries widely patent, unremarkable.  Right iliac:            Common iliac and visualized proximal segments of external and internal iliac arteries widely patent.  Venous findings:  Venous phase imaging was not obtained.   Review of the MIP images confirms the above findings.  Nonvascular findings: Unremarkable arterial phase evaluation of the liver, spleen, adrenal glands, kidneys, pancreas.  Multiple noncalcified stones in the gallbladder with gas clefts noted. Stomach is nondilated.  Visualized segments of small bowel and colon are unremarkable.  No ascites.  No free air. 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  Facet disease at this level allows early anterolisthesis.  IMPRESSION: 1.  Negative for dissection or other acute abnormality. 2.  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 3.  Cholelithiasis.  Original Report Authenticated By: Thora Lance III, M.D.    Scheduled Meds:    . busPIRone  20 mg Oral BID  . citalopram  40 mg Oral Daily  . enoxaparin (LOVENOX) injection  40 mg Subcutaneous QHS  . fluticasone  1 puff Inhalation BID  . hydrochlorothiazide  25 mg Oral Daily  . nicotine  14 mg Transdermal Daily  . pantoprazole (PROTONIX) IV  40 mg Intravenous QHS  . potassium chloride  10  mEq Intravenous Q1 Hr x 4  . potassium chloride  40 mEq Oral Once  . sodium chloride  3 mL Intravenous Q12H   Continuous Infusions:    . sodium chloride 125 mL/hr at 12/18/11 1046    Principal Problem:  *Elevated CK Active Problems:  Opiate withdrawal  Cocaine abuse  Encephalopathy acute  Bradycardia hypokalemia   Time spent:    Lillyn Wieczorek C  [facility] Pager [phone]. If 8PM-8AM, please contact night-coverage at [email or web address], password [facility] 12/18/2011, 3:29 PM  LOS: 2 days

## 2011-12-18 NOTE — Consult Note (Signed)
Patient Identification:  Wendy Carr Date of Evaluation:  12/18/2011 Pt evaluated 12/17/11 and was moved before note entered Reason for Consult:  Cocaine, Heroin abuse  Referring Provider: Dr. Donna Bernard  History of Present Illness:Pt says that she has been using cocaine and heroin for some time and she told her husband and daughter [33] that she wants to get help.  She had her husband bring her to the ED.  Past Psychiatric History:Pt states she went to Rehab before for 28 days.    Past Medical History:     Past Medical History  Diagnosis Date  . GERD   . HYPERTENSION   . ALLERGIC RHINITIS   . ANEMIA-NOS   . ANXIETY   . ASTHMA   . HYPOTHYROIDISM   . HYPERGLYCEMIA, BORDERLINE   . Personal history of colonic polyps 2011    last colonscopy 2011: no polyps.       Past Surgical History  Procedure Date  . Myomectomy 1997  . Breast surgery 2004    Lumpectomy, right side-Benign  . Thyroidectomy, partial 2004    Left, benign  . Vein surgery 1997    left arm  . Foot surgery 2006    right  . Hernia repair 1995    [facility],    Allergies:  Allergies  Allergen Reactions  . Naproxen Other (See Comments)    Stomach cramps     Current Medications:  Prior to Admission medications   Medication Sig Start Date End Date Taking? Authorizing Provider  albuterol (PROVENTIL HFA;VENTOLIN HFA) 108 (90 BASE) MCG/ACT inhaler Inhale 2 puffs into the lungs 4 (four) times daily as needed. For shortness of breath 07/17/11  Yes Newt Lukes, MD  ALPRAZolam Prudy Feeler) 0.5 MG tablet Take 0.5 mg by mouth 3 (three) times daily as needed.   Yes Historical Provider, MD  B Complex Vitamins (B COMPLEX PO) Take 1 tablet by mouth daily.    Yes Historical Provider, MD  beclomethasone (QVAR) 40 MCG/ACT inhaler Inhale 2 puffs into the lungs 2 (two) times daily. 02/07/11  Yes Newt Lukes, MD  busPIRone (BUSPAR) 10 MG tablet Take 2 tablets (20 mg total) by mouth 2 (two) times daily. 02/07/11  02/07/12 Yes Newt Lukes, MD  citalopram (CELEXA) 40 MG tablet Take 1 tablet (40 mg total) by mouth daily. 02/07/11 02/07/12 Yes Newt Lukes, MD  ibuprofen (ADVIL,MOTRIN) 200 MG tablet Take 400 mg by mouth every 6 (six) hours as needed. For pain   Yes Historical Provider, MD  lisinopril-hydrochlorothiazide (PRINZIDE,ZESTORETIC) 10-12.5 MG per tablet Take 1 tablet by mouth daily.   Yes Historical Provider, MD  traMADol (ULTRAM) 50 MG tablet Take 50 mg by mouth every 6 (six) hours as needed.   Yes Historical Provider, MD    Social History:    reports that she has been smoking Cigarettes.  She has been smoking about 0 packs per day. She does not have any smokeless tobacco history on file. She reports that she uses illicit drugs (Cocaine). She reports that she does not drink alcohol.   Family History:    Family History  Problem Relation Age of Onset  . Hypertension Mother   . Osteoarthritis Mother   . Diabetes Sister   . Arthritis Other   . Pancreatic cancer Other   . 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  AXIS IV economic problems, occupational problems, other psychosocial or environmental problems, problems related to legal system/crime, problems related to social environment and complains of boredom  AXIS V 51-60 moderate symptoms   Assessment/Plan: Pt is  evaluated 12/17/11  Discussed with Psych CSW Pt is seen in early am, much later moved [did not find rm#].  She is awake, alert, gives long stares before answering questions.  She says she has been taking Cocaine off and on because she gets bored.  She came to ED with UDS positive for benzodiazepines, opiates and cocaine.  She recognizes this is a chronic, episodic problem and creates more financial problems.  She has decided she needs to quit and requests rehab program.    She is oriented to person place and situation.  She denies suicidal ideation, homicidal ideation; no AV hallucinations.  She agrees to talk with the Chief Operating Officer.  RECOMMENDATION:  1.  Pt has capacity [question judgment - caring for children] to choose Rehab program 2.  Agree with Celexa 40 mg daily for depression 3.  Agree with Buspar, buspirone, 20 mg 2 X daily for anxiety 4.  Agree with Nicotine patch 14 mg daily  5. Transfer pt to rehab program when medically stable.  Shenika Quint J. Ferol Luz, MD Psychiatrist  12/18/2011 3:52 PM

## 2011-12-18 NOTE — Progress Notes (Signed)
Met with Pt and husband.  Pt stated that she is ready for tx but has insecurities about her husband's commitment to the marriage.  Specifically, Pt is worried that her husband is trying to send her away so that he can pack his bags and leave.  Pt's husband reassured Pt that he loves her and that he's committed to making their marriage work.  He stated that he is a former addict and that he understand addiction and that he's willing to support Pt in anyway.  Both agreed to seek marital tx after Pt finishes with [facility] tx.  CSW discussed with Pt [facility]'s bed status.  Pt's husband discussed with Pt the information he learned from [facility] of [place].  Pt interested in going to either facility but wants to go home first to retrieve her belongings.  Pt's husband not comfortable with this and asked that Pt use the toiletries and clothes that she has on her person now; he will bring her additional items this weekend.  Pt adamant that she wants to go home first.  Pt and husband proceeded to argue about this matter.  Pt then went into the bathroom and stated, "I'm not going.  Forget it."  Pt agreed to come out from the bathroom and speak with MD, who entered the room and witnessed the mild altercation.  CSW excused herself to allow MD to talk with Pt and to allow Pt and husband to discuss the issue in private.  Spoke with MD after her examination of Pt.  MD stated that Pt is medically ready for d/c and that she will write d/c order today.  Returned to speak with Pt and husband.    Pt stated, "I'm better," and agrees to go to [facility] [place] from the hospital.  With Pt and husband present, CSW contacted [facility], spoke with Laurey Arrow [phone]), and was informed that they do not have a bed today but that it's likely they will have a bed tomorrow.  CSW provided facility with Pt's information and confirmed that facility can pick up Pt upon admission.  Facility requesting H&P, d/c summary, lab  work and meds faxed to [phone].  Facility asked to speak with Pt.    While Pt speaking with facility, notified MD and RN of facility's bed availability tomorrow.  MD to write d/c summary.  CSW thanked Pt and her husband for their time.  CSW to faxed aforementioned requested information to facility, when available.  CSW to continue to follow.  Providence Crosby, LCSWA Clinical Social Work [phone]

## 2011-12-18 NOTE — Progress Notes (Addendum)
Per Drema Pry at [facility], no female beds available today.  Drema Pry anticipates a bed becoming available on Thursday but suggested that CSW call daily around 0915 to inquire about bed status.  Attempted to meet with Pt to discuss information learned from [facility].  Pt sleeping soundly; CSW unable to awaken Pt.  CSW to continue to follow.  Providence Crosby, LCSWA Clinical Social Work [phone]

## 2011-12-19 DIAGNOSIS — R748 Abnormal levels of other serum enzymes: Secondary | ICD-10-CM

## 2011-12-19 DIAGNOSIS — I10 Essential (primary) hypertension: Secondary | ICD-10-CM

## 2011-12-19 LAB — BASIC METABOLIC PANEL
BUN: 5 mg/dL — ABNORMAL LOW (ref 6–23)
CO2: 28 mEq/L (ref 19–32)
Chloride: 108 mEq/L (ref 96–112)
GFR calc Af Amer: >90 mL/min (ref >90)
Glucose, Bld: 123 mg/dL — ABNORMAL HIGH (ref 70–99)
Potassium: 3.5 mEq/L (ref 3.5–5.1)

## 2011-12-19 MED ORDER — TRAZODONE HCL 50 MG PO TABS: 50.0000 mg | ORAL_TABLET | Freq: Every day | ORAL | Status: DC

## 2011-12-19 MED ORDER — BECLOMETHASONE DIPROPIONATE 40 MCG/ACT IN AERS: 2.0000 | INHALATION_SPRAY | Freq: Two times a day (BID) | RESPIRATORY_TRACT | Status: DC

## 2011-12-19 MED ORDER — BUSPIRONE HCL 10 MG PO TABS: 20.0000 mg | ORAL_TABLET | Freq: Two times a day (BID) | ORAL | Status: DC

## 2011-12-19 MED ORDER — LISINOPRIL-HYDROCHLOROTHIAZIDE 10-12.5 MG PO TABS: 1.0000 | ORAL_TABLET | Freq: Every day | ORAL | Status: DC

## 2011-12-19 MED ORDER — ALPRAZOLAM 0.5 MG PO TABS: 0.5000 mg | ORAL_TABLET | Freq: Three times a day (TID) | ORAL | Status: DC | PRN

## 2011-12-19 MED ORDER — CITALOPRAM HYDROBROMIDE 40 MG PO TABS: 40.0000 mg | ORAL_TABLET | Freq: Every day | ORAL | Status: DC

## 2011-12-19 NOTE — Discharge Summary (Signed)
Physician Discharge Summary  Wendy Carr FAO:[id number] DOB: 25-Sep-1955 DOA: 12/16/2011  PCP: Rene Paci, MD  Admit date: 12/16/2011 Discharge date: 12/19/2011  Recommendations for Outpatient Follow-up:  1. Patient has been discharged to [facility] patient to call followup with primary care physician upon discharge  Discharge Diagnoses:  Principal Problem:  *Elevated CK Active Problems:  Opiate withdrawal  Cocaine abuse  Encephalopathy acute  Bradycardia  hypertension History of depression  Discharge Condition: Improved/stable  Diet recommendation: Low sodium heart healthy  Filed Weights   12/17/11 0005  Weight: 99.7 kg (219 lb 12.8 oz)    History of present illness:  This is a 56 year old African American female with a history of hypertension, hypothyroidism, GERD, and a history of extensive drug use admitted a four-day history of cocaine, heroine abuse.  The patient had been missing for several days and was just by her family on day of admission- she was agitated and complaining of abdominal pain. UDS was + for cocaine, opiates and benzos.   Hospital Course by problem list:  1. Elevated CK/rhabdomyolysis - CK elevated likely mild rhabdomyolysis 2/2 to cocaine.  Upon admission the patient had cardiac enzymes cycled and CK square elevated with negative Troponins. The patient was chest pain-free. The impression was that this was do to rhabdomyolysis secondary to cocaine, She was hydrated with IV fluids and her CKs improved to the 280s. She has remained asymptomatic. She was counseled to quit cocaine and is being discharged to a rehabilitation facility at this time.  #2 encephalopathy: Secondary to acute intoxication with, heroine, cocaine, opiates.  CT negative for any acute process. She was managed supportively and improved clinically. She is in a baseline mentation and expressed interest in drug rehabilitation and as above she  is being discharged to [facility]  #3 opiate withdrawal: she was managed operatively and psych was consulted and patient will be going for rehabilitation. #4 cocaine abuse/Heroine abuse -As discussed above, cardiac enzymes were cycled and were consistent with rhabdomyolysis psychiatry was consulted and recommended rehabilitation which she was interested in, and as above is being discharged to rehabilitation.  #5 bradycardia: Likely secondary to above drugs, she was closely monitored in the step down on admission and this improved. Heart rate prior to discharge is 58 and she is asymptomatic.  #6 hypertension: She is to continue her outpatient medications upon discharge. #7 history of depression-the patient is to continue her outpatient medications upon discharge.    Procedures:  none  Consultations:  psych  Discharge Exam: Filed Vitals:   12/19/11 0818  BP: 150/80  Pulse: 58  Temp: 97.9 F (36.6 C)  Resp:    Filed Vitals:   12/19/11 0500 12/19/11 0613 12/19/11 0818 12/19/11 0858  BP: 162/137 149/87 150/80   Pulse: 62 62 58   Temp: 98.2 F (36.8 C)  97.9 F (36.6 C)   TempSrc: Oral  Oral   Resp: 16     Height:      Weight:      SpO2: 100%  99% 99%      Discharge Instructions  Discharge Orders    Future Orders Please Complete By Expires   Diet - low sodium heart healthy      Increase activity slowly        Medication List  As of 12/19/2011 10:11 AM   STOP taking these medications         traMADol 50 MG tablet  TAKE these medications         albuterol 108 (90 BASE) MCG/ACT inhaler   Commonly known as: PROVENTIL HFA;VENTOLIN HFA   Inhale 2 puffs into the lungs 4 (four) times daily as needed. For shortness of breath      ALPRAZolam 0.5 MG tablet   Commonly known as: XANAX   Take 1 tablet (0.5 mg total) by mouth 3 (three) times daily as needed.      B COMPLEX PO   Take 1 tablet by mouth daily.      beclomethasone 40 MCG/ACT inhaler    Commonly known as: QVAR   Inhale 2 puffs into the lungs 2 (two) times daily.      busPIRone 10 MG tablet   Commonly known as: BUSPAR   Take 2 tablets (20 mg total) by mouth 2 (two) times daily.      citalopram 40 MG tablet   Commonly known as: CELEXA   Take 1 tablet (40 mg total) by mouth daily.      ibuprofen 200 MG tablet   Commonly known as: ADVIL,MOTRIN   Take 400 mg by mouth every 6 (six) hours as needed. For pain      lisinopril-hydrochlorothiazide 10-12.5 MG per tablet   Commonly known as: PRINZIDE,ZESTORETIC   Take 1 tablet by mouth daily.      traZODone 50 MG tablet   Commonly known as: DESYREL   Take 1 tablet (50 mg total) by mouth at bedtime.              The results of significant diagnostics from this hospitalization (including imaging, microbiology, ancillary and laboratory) are listed below for reference.    Significant Diagnostic Studies: Ct Head Wo Contrast  12/16/2011  *RADIOLOGY REPORT*  Clinical Data: Combative, crack abuse, chest pain.  CT HEAD WITHOUT CONTRAST  Technique:  Contiguous axial images were obtained from the base of the skull through the vertex without contrast.  Comparison: 05/29/2011  Findings: There is no evidence of acute intracranial hemorrhage, brain edema, mass lesion, acute infarction,   mass effect, or midline shift. Acute infarct may be inapparent on noncontrast CT. No other intra-axial abnormalities are seen, and the ventricles and sulci are within normal limits in size and symmetry.   No abnormal extra-axial fluid collections or masses are identified.  No significant calvarial abnormality.  IMPRESSION: 1. Negative for bleed or other acute intracranial process.  Original Report Authenticated By: Osa Craver, M.D.   Ct Angio Chest Pe W/cm &/or Wo Cm  12/16/2011  *RADIOLOGY REPORT*  Clinical Data: Chest pain, crack abuse.  CT ANGIOGRAPHY CHEST, ABDOMEN AND PELVIS  Technique:  Multidetector CT imaging through the chest, abdomen  and pelvis was performed using the standard protocol during bolus administration of intravenous contrast.  Multiplanar reconstructed images including MIPs were obtained and reviewed to evaluate the vascular anatomy.  Contrast: OMNIPAQUE IOHEXOL 350 MG/ML SOLN  Comparison:  03/31/2007  CTA CHEST  Findings:  Negative for aortic dissection, aneurysm, or stenosis. There is classic three-vessel brachiocephalic arterial origin anatomy without proximal stenosis.  There is fairly good pulmonary arterial opacification without filling defect to suggest acute PE.  The study was not optimized for detection of pulmonary emboli.  No pleural or pericardial effusion.  No hilar or mediastinal adenopathy.  Minimal dependent atelectasis posteriorly in the lower lobes.  Patient breathing during the acquisition degrades some of the images.  Minimal spondylitic changes in the mid thoracic spine. Sternum intact.   Review  of the MIP images confirms the above findings.  IMPRESSION:  1.  Negative for acute PE or thoracic aortic dissection.  CT ANGIOGRAPHY ABDOMEN AND PELVIS  Arterial findings: Aorta:                  Negative for dissection, aneurysm, or stenosis.  There is some eccentric nonocclusive smooth mural thrombus in the suprarenal segment and scattered calcified plaque in the infrarenal segment.  Celiac axis:            Patent.  The right hepatic artery originates directly from the abdominal aorta, an anatomic variant.  Superior mesenteric:Widely patent  Left renal:             Duplicated, inferior dominant.  There is mild plaque involving the ostium of the dominant inferior left renal artery resulting greater than 50 % diameter stenosis over a short segment less than 1 cm; vessel patent distally.  Right renal:            Single, patent  Inferior mesenteric:Patent  Left iliac:             Common iliac and visualized segments of the proximal external and internal iliac arteries widely patent, unremarkable.  Right iliac:             Common iliac and visualized proximal segments of external and internal iliac arteries widely patent.  Venous findings:  Venous phase imaging was not obtained.   Review of the MIP images confirms the above findings.  Nonvascular findings: Unremarkable arterial phase evaluation of the liver, spleen, adrenal glands, kidneys, pancreas.  Multiple noncalcified stones in the gallbladder with gas clefts noted. Stomach is nondilated.  Visualized segments of small bowel and colon are unremarkable.  No ascites.  No free air. 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  Facet disease at this level allows early anterolisthesis.  IMPRESSION: 1.  Negative for dissection or other acute abnormality. 2.  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 3.  Cholelithiasis.  Original Report Authenticated By: Osa Craver, M.D.   Dg Chest Portable 1 View  12/16/2011  *RADIOLOGY REPORT*  Clinical Data: Chest pain  PORTABLE CHEST - 1 VIEW  Comparison: 02/08/2011  Findings:  Low lung volumes.  Mild central pulmonary vascular congestion.  No focal consolidation.  No effusion.  Heart size upper limits normal for technique.  IMPRESSION:  1.  New central pulmonary vascular congestion. 2.  Borderline cardiomegaly.  Original Report Authenticated By: Osa Craver, M.D.   Ct Angio Abd/pel W/ And/or W/o  12/16/2011  *RADIOLOGY REPORT*  Clinical Data: Chest pain, crack abuse.  CT ANGIOGRAPHY CHEST, ABDOMEN AND PELVIS  Technique:  Multidetector CT imaging through the chest, abdomen and pelvis was performed using the standard protocol during bolus administration of intravenous contrast.  Multiplanar reconstructed images including MIPs were obtained and reviewed to evaluate the vascular anatomy.  Contrast: OMNIPAQUE IOHEXOL 350 MG/ML SOLN  Comparison:  03/31/2007  CTA CHEST  Findings:  Negative for aortic dissection, aneurysm, or stenosis. There is classic three-vessel  brachiocephalic arterial origin anatomy without proximal stenosis.  There is fairly good pulmonary arterial opacification without filling defect to suggest acute PE.  The study was not optimized for detection of pulmonary emboli.  No pleural or pericardial effusion.  No hilar or mediastinal adenopathy.  Minimal dependent atelectasis posteriorly in the lower lobes.  Patient breathing during the acquisition degrades some of the images.  Minimal spondylitic changes in  the mid thoracic spine. Sternum intact.   Review of the MIP images confirms the above findings.  IMPRESSION:  1.  Negative for acute PE or thoracic aortic dissection.  CT ANGIOGRAPHY ABDOMEN AND PELVIS  Arterial findings: Aorta:                  Negative for dissection, aneurysm, or stenosis.  There is some eccentric nonocclusive smooth mural thrombus in the suprarenal segment and scattered calcified plaque in the infrarenal segment.  Celiac axis:            Patent.  The right hepatic artery originates directly from the abdominal aorta, an anatomic variant.  Superior mesenteric:Widely patent  Left renal:             Duplicated, inferior dominant.  There is mild plaque involving the ostium of the dominant inferior left renal artery resulting greater than 50 % diameter stenosis over a short segment less than 1 cm; vessel patent distally.  Right renal:            Single, patent  Inferior mesenteric:Patent  Left iliac:             Common iliac and visualized segments of the proximal external and internal iliac arteries widely patent, unremarkable.  Right iliac:            Common iliac and visualized proximal segments of external and internal iliac arteries widely patent.  Venous findings:  Venous phase imaging was not obtained.   Review of the MIP images confirms the above findings.  Nonvascular findings: Unremarkable arterial phase evaluation of the liver, spleen, adrenal glands, kidneys, pancreas.  Multiple noncalcified stones in the gallbladder with gas  clefts noted. Stomach is nondilated.  Visualized segments of small bowel and colon are unremarkable.  No ascites.  No free air. 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  Facet disease at this level allows early anterolisthesis.  IMPRESSION: 1.  Negative for dissection or other acute abnormality. 2.  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 3.  Cholelithiasis.  Original Report Authenticated By: Osa Craver, M.D.    Microbiology: Recent Results (from the past 240 hour(s))  MRSA PCR SCREENING     Status: Normal   Collection Time   12/17/11 12:15 AM      Component Value Range Status Comment   MRSA by PCR NEGATIVE  NEGATIVE Final      Labs: Basic Metabolic Panel:  Lab 12/19/11 4098 12/18/11 0505 12/17/11 0625 12/16/11 1850  NA 142 140 139 142  K 3.5 3.4* 3.0* 3.3*  CL 108 109 108 106  CO2 28 25 24 25   GLUCOSE 123* 132* 104* 111*  BUN 5* 7 16 26*  CREATININE 0.73 0.60 0.73 1.14*  CALCIUM 8.7 8.1* 8.0* 9.0  MG -- -- -- --  PHOS -- -- -- --   Liver Function Tests:  Lab 12/16/11 1850  AST 59*  ALT 50*  ALKPHOS 84  BILITOT 0.3  PROT 7.5  ALBUMIN 3.8   No results found for this basename: LIPASE:5,AMYLASE:5 in the last 168 hours No results found for this basename: AMMONIA:5 in the last 168 hours CBC:  Lab 12/16/11 1850  WBC 9.3  NEUTROABS 4.3  HGB 11.3*  HCT 36.6  MCV 83.0  PLT 146*   Cardiac Enzymes:  Lab 12/18/11 0505 12/17/11 1201 12/17/11 0625 12/17/11 0020 12/16/11 1850  CKTOTAL 287* 566* 659* 931* 1255*  CKMB -- 8.5* 10.4* 13.7* 17.1*  CKMBINDEX -- -- -- -- --  TROPONINI -- <0.30 <0.30 <0.30 <0.30   BNP: BNP (last 3 results) No results found for this basename: PROBNP:3 in the last 8760 hours CBG:  Lab 12/16/11 1855  GLUCAP 102*    Time coordinating discharge: >30 minutes  Signed:  Fenton Candee C  [facility] 12/19/2011, 10:11 AM

## 2011-12-19 NOTE — Progress Notes (Signed)
Gave pt discharge instructions and explained them to her. Pt verbalized understanding of instructions given. Pt left via wheelchair with tech in no acute distress. 

## 2011-12-19 NOTE — Progress Notes (Signed)
Spoke with Johnnette Barrios at [facility] [place] and confirmed bed availability for today.  Driver to arrive between 11 & 1130.  Facility requesting d/c summary, H&P, recent labs and meds.  CSW spoke with Pt and husband.  Pt ready to go.  Husband supportive.  CSW notified RN and MD.  CSW to fax documents to facility.  Transportation has been arranged.  Pt to be d/c'd.  Providence Crosby, LCSWA Clinical Social Work [phone]

## 2012-01-22 ENCOUNTER — Other Ambulatory Visit: Payer: Self-pay | Admitting: Internal Medicine

## 2012-02-04 ENCOUNTER — Other Ambulatory Visit: Payer: Self-pay | Admitting: Internal Medicine

## 2012-02-29 ENCOUNTER — Other Ambulatory Visit: Payer: Self-pay | Admitting: Internal Medicine

## 2012-02-29 NOTE — Telephone Encounter (Signed)
Rx faxed to [facility]. 

## 2012-02-29 NOTE — Telephone Encounter (Signed)
Last written 12/19/2011 #30 with 0 refills-please advise.

## 2012-03-14 ENCOUNTER — Other Ambulatory Visit: Payer: Self-pay | Admitting: Internal Medicine

## 2012-03-19 ENCOUNTER — Encounter: Payer: Self-pay | Admitting: Internal Medicine

## 2012-03-19 ENCOUNTER — Ambulatory Visit (INDEPENDENT_AMBULATORY_CARE_PROVIDER_SITE_OTHER): Payer: 59 | Admitting: Internal Medicine

## 2012-03-19 VITALS — BP 118/66 | HR 69 | Temp 97.2°F | Resp 16 | Ht 62.75 in | Wt 226.5 lb

## 2012-03-19 DIAGNOSIS — Z23 Encounter for immunization: Secondary | ICD-10-CM

## 2012-03-19 DIAGNOSIS — D229 Melanocytic nevi, unspecified: Secondary | ICD-10-CM

## 2012-03-19 DIAGNOSIS — D239 Other benign neoplasm of skin, unspecified: Secondary | ICD-10-CM

## 2012-03-19 DIAGNOSIS — J45909 Unspecified asthma, uncomplicated: Secondary | ICD-10-CM

## 2012-03-19 DIAGNOSIS — F411 Generalized anxiety disorder: Secondary | ICD-10-CM

## 2012-03-19 DIAGNOSIS — N644 Mastodynia: Secondary | ICD-10-CM

## 2012-03-19 DIAGNOSIS — I10 Essential (primary) hypertension: Secondary | ICD-10-CM

## 2012-03-19 MED ORDER — MELOXICAM 15 MG PO TABS: 15.0000 mg | ORAL_TABLET | Freq: Every day | ORAL | Status: DC

## 2012-03-19 NOTE — Progress Notes (Signed)
  Subjective:    Patient ID: Wendy Carr, female    DOB: [birth year], 56 y.o.   MRN: [id number]  HPI  complains of R breast pain Denies swelling, trauma or discharge - Intermittent hx same - reports overdue for follow up   Also concerned about skin moles/growths  Past Medical History  Diagnosis Date  . GERD   . HYPERTENSION   . ALLERGIC RHINITIS   . ANEMIA-NOS   . ANXIETY   . ASTHMA   . HYPOTHYROIDISM   . HYPERGLYCEMIA, BORDERLINE   . Personal history of colonic polyps 2011    last colonscopy 2011: no polyps.    Review of Systems  Constitutional: Positive for fatigue. Negative for unexpected weight change.  Respiratory: Positive for cough (dry, occ) and wheezing (occ). Negative for chest tightness.   Cardiovascular: Negative for chest pain and leg swelling.  Musculoskeletal: Positive for myalgias and back pain.  Skin: Negative for color change.  Neurological: Negative for headaches.       Objective:   Physical Exam BP 118/66  Pulse 69  Temp 97.2 F (36.2 C) (Oral)  Resp 16  Ht 5' 2.75" (1.594 m)  Wt 226 lb 8 oz (102.74 kg)  BMI 40.44 kg/m2  SpO2 98% Wt Readings from Last 3 Encounters:  03/19/12 226 lb 8 oz (102.74 kg)  12/17/11 219 lb 12.8 oz (99.7 kg)  07/21/11 219 lb (99.338 kg)   Constitutional: She appears well-developed and well-nourished. No distress. spouse at side Neck: Normal range of motion. Neck supple. No JVD present. No thyromegaly present.  Breasts - no mass or cellulitis - no discharge or asymmetry  Cardiovascular: Normal rate, regular rhythm and normal heart sounds.  No murmur heard. No BLE edema. Pulmonary/Chest: Effort normal and breath sounds normal. No respiratory distress. She has no wheezes.  Skin: Mole 2 o'clock R areolar edge, cutaneous horn near L eye -Skin is warm and dry. No rash noted. No erythema.  Psychiatric: She has a normal mood and affect. Her behavior is normal. Judgment and thought content normal.   Lab Results    Component Value Date   WBC 9.3 12/16/2011   HGB 11.3* 12/16/2011   HCT 36.6 12/16/2011   PLT 146* 12/16/2011   GLUCOSE 123* 12/19/2011   CHOL 186 05/03/2011   TRIG 151.0* 05/03/2011   HDL 39.40 05/03/2011   LDLCALC 116* 05/03/2011   ALT 50* 12/16/2011   AST 59* 12/16/2011   NA 142 12/19/2011   K 3.5 12/19/2011   CL 108 12/19/2011   CREATININE 0.73 12/19/2011   BUN 5* 12/19/2011   CO2 28 12/19/2011   TSH 0.82 05/03/2011   HGBA1C 6.3 05/03/2011       Assessment & Plan:   R breast pain, hx abnormal mammo - refer for dx mammo now  Atypical skin mole/change mole - refer to derm

## 2012-03-19 NOTE — Assessment & Plan Note (Signed)
chronic symptoms exac stressors at home (marriage) and work (changing jobs) Increased trazadone for sleep 12/2010 - also increase buspar 12/2010 and added prn xanax -  Continue celexa Denies si/hi  

## 2012-03-19 NOTE — Patient Instructions (Signed)
It was good to see you today. We have reviewed your prior records including labs and tests today we'll make referral for diagnostic mammogram . Our office will contact you regarding appointment(s) once made. Also we'll make referral to dermatology skin specialists for your skin growths . Our office will contact you regarding appointment(s) once made. Use meloxicam for pin in place of ibuprofen - Your prescription(s) have been submitted to your pharmacy. Please take as directed and contact our office if you believe you are having problem(s) with the medication(s). Other Medications reviewed, no changes at this time.

## 2012-03-19 NOTE — Assessment & Plan Note (Signed)
BP Readings from Last 3 Encounters:  03/19/12 118/66  12/19/11 150/80  08/04/11 151/90   The current medical regimen is generally effective;  continue present plan and medications.

## 2012-03-19 NOTE — Assessment & Plan Note (Signed)
Mild exac precipitated by running out of steroid inhaler and [facility] MDI ($ limitations)

## 2012-03-28 ENCOUNTER — Ambulatory Visit
Admission: RE | Admit: 2012-03-28 | Discharge: 2012-03-28 | Disposition: A | Discharge status: Home / Self Care | Payer: 59 | Source: Ambulatory Visit | Attending: Internal Medicine | Admitting: Internal Medicine

## 2012-03-28 ENCOUNTER — Other Ambulatory Visit: Payer: Self-pay | Admitting: Internal Medicine

## 2012-03-28 DIAGNOSIS — N644 Mastodynia: Secondary | ICD-10-CM

## 2012-04-14 ENCOUNTER — Other Ambulatory Visit: Payer: Self-pay | Admitting: Internal Medicine

## 2012-04-15 NOTE — Telephone Encounter (Signed)
Faxed script back to [facility]...Wendy Carr

## 2012-05-01 ENCOUNTER — Telehealth: Payer: Self-pay | Admitting: *Deleted

## 2012-05-01 MED ORDER — DICLOFENAC SODIUM 75 MG PO TBEC: 75.0000 mg | DELAYED_RELEASE_TABLET | Freq: Two times a day (BID) | ORAL | Status: DC | PRN

## 2012-05-01 NOTE — Telephone Encounter (Signed)
Notified pt with md response.../lmb 

## 2012-05-01 NOTE — Telephone Encounter (Signed)
Ok - done -  but pt should not use diclofenac pill at same time as meloxicam OR advil as all these medications are in the same class of drugs - thanks

## 2012-05-01 NOTE — Telephone Encounter (Signed)
Received fax sting for cost saving pt would like to switch to oral diclofenac instead of using gel...lmb

## 2012-05-01 NOTE — Addendum Note (Signed)
Addended by: Rene Paci A on: 05/01/2012 12:48 PM   Modules accepted: Orders

## 2012-05-15 ENCOUNTER — Other Ambulatory Visit: Payer: Self-pay | Admitting: *Deleted

## 2012-05-15 MED ORDER — TRAZODONE HCL 50 MG PO TABS: 50.0000 mg | ORAL_TABLET | Freq: Every day | ORAL | Status: DC

## 2012-05-26 ENCOUNTER — Other Ambulatory Visit: Payer: Self-pay | Admitting: Internal Medicine

## 2012-05-26 NOTE — Telephone Encounter (Signed)
Rx faxed to [facility].

## 2012-05-28 ENCOUNTER — Ambulatory Visit (INDEPENDENT_AMBULATORY_CARE_PROVIDER_SITE_OTHER): Payer: 59 | Admitting: *Deleted

## 2012-05-28 DIAGNOSIS — Z111 Encounter for screening for respiratory tuberculosis: Secondary | ICD-10-CM

## 2012-05-28 MED ORDER — ALBUTEROL SULFATE HFA 108 (90 BASE) MCG/ACT IN AERS: 2.0000 | INHALATION_SPRAY | Freq: Four times a day (QID) | RESPIRATORY_TRACT | Status: DC | PRN

## 2012-05-30 ENCOUNTER — Encounter: Payer: Self-pay | Admitting: *Deleted

## 2012-05-30 LAB — TB SKIN TEST
Induration: 0 mm
TB Skin Test: NEGATIVE

## 2012-07-10 ENCOUNTER — Telehealth: Payer: Self-pay | Admitting: *Deleted

## 2012-07-10 DIAGNOSIS — Z0000 Encounter for general adult medical examination without abnormal findings: Secondary | ICD-10-CM

## 2012-07-10 NOTE — Telephone Encounter (Signed)
Message copied by Deatra James on Thu Jul 10, 2012  1:34 PM ------      Message from: Livingston Diones      Created: Thu Jul 10, 2012  1:32 PM       Pt having CPE done on 07/28/12. Please put in lab order a week prior. Pt has UMR.  ------

## 2012-07-10 NOTE — Telephone Encounter (Signed)
Entered cpx labs...lmb 

## 2012-07-21 ENCOUNTER — Other Ambulatory Visit: Payer: Self-pay | Admitting: Internal Medicine

## 2012-07-22 ENCOUNTER — Other Ambulatory Visit (INDEPENDENT_AMBULATORY_CARE_PROVIDER_SITE_OTHER): Payer: 59

## 2012-07-22 DIAGNOSIS — Z0000 Encounter for general adult medical examination without abnormal findings: Secondary | ICD-10-CM

## 2012-07-22 LAB — LIPID PANEL
HDL: 42.1 mg/dL (ref >39.00)
LDL Cholesterol: 135 mg/dL — ABNORMAL HIGH (ref 0–99)
Total CHOL/HDL Ratio: 5
VLDL: 14.2 mg/dL (ref 0.0–40.0)

## 2012-07-22 LAB — TSH: TSH: 0.94 u[IU]/mL (ref 0.35–5.50)

## 2012-07-22 LAB — CBC WITH DIFFERENTIAL/PLATELET
Basophils Absolute: 0 10*3/uL (ref 0.0–0.1)
Eosinophils Relative: 3.8 % (ref 0.0–5.0)
Hemoglobin: 11.9 g/dL — ABNORMAL LOW (ref 12.0–15.0)
Lymphocytes Relative: 30.1 % (ref 12.0–46.0)
Monocytes Relative: 6.4 % (ref 3.0–12.0)
Neutro Abs: 3.9 10*3/uL (ref 1.4–7.7)
RDW: 18 % — ABNORMAL HIGH (ref 11.5–14.6)
WBC: 6.7 10*3/uL (ref 4.5–10.5)

## 2012-07-22 LAB — BASIC METABOLIC PANEL
Calcium: 9.2 mg/dL (ref 8.4–10.5)
GFR: 76.64 mL/min (ref >60.00)
Glucose, Bld: 122 mg/dL — ABNORMAL HIGH (ref 70–99)
Potassium: 3.9 mEq/L (ref 3.5–5.1)
Sodium: 137 mEq/L (ref 135–145)

## 2012-07-22 LAB — URINALYSIS, ROUTINE W REFLEX MICROSCOPIC
Leukocytes, UA: NEGATIVE
Specific Gravity, Urine: 1.01 (ref 1.000–1.030)
Urine Glucose: NEGATIVE
Urobilinogen, UA: 0.2 (ref 0.0–1.0)
pH: 7 (ref 5.0–8.0)

## 2012-07-22 LAB — HEPATIC FUNCTION PANEL: Total Bilirubin: 0.4 mg/dL (ref 0.3–1.2)

## 2012-07-22 NOTE — Telephone Encounter (Signed)
Called [facility] spke with Morrie Sheldon gve renewal over phone...lmb

## 2012-07-28 ENCOUNTER — Encounter: Payer: Self-pay | Admitting: Internal Medicine

## 2012-07-28 ENCOUNTER — Ambulatory Visit (INDEPENDENT_AMBULATORY_CARE_PROVIDER_SITE_OTHER): Payer: 59 | Admitting: Internal Medicine

## 2012-07-28 VITALS — BP 120/78 | HR 78 | Temp 98.4°F | Ht 62.75 in | Wt 231.4 lb

## 2012-07-28 DIAGNOSIS — F411 Generalized anxiety disorder: Secondary | ICD-10-CM

## 2012-07-28 DIAGNOSIS — Z124 Encounter for screening for malignant neoplasm of cervix: Secondary | ICD-10-CM

## 2012-07-28 DIAGNOSIS — Z0000 Encounter for general adult medical examination without abnormal findings: Secondary | ICD-10-CM

## 2012-07-28 DIAGNOSIS — I10 Essential (primary) hypertension: Secondary | ICD-10-CM

## 2012-07-28 DIAGNOSIS — R7309 Other abnormal glucose: Secondary | ICD-10-CM

## 2012-07-28 MED ORDER — CYCLOBENZAPRINE HCL 5 MG PO TABS
5.0000 mg | ORAL_TABLET | Freq: Three times a day (TID) | ORAL | Status: DC | PRN
Start: 2012-07-28 — End: 2012-09-11

## 2012-07-28 MED ORDER — DICLOFENAC SODIUM 1 % TD GEL: 2.0000 g | Freq: Three times a day (TID) | TRANSDERMAL | Status: DC

## 2012-07-28 NOTE — Patient Instructions (Signed)
It was good to see you today. Health Maintenance reviewed - all recommended immunizations and age-appropriate screenings are up-to-date. we'll make referral to gynecology for PAP/pelvic. Our office will contact you regarding appointment(s) once made. Medications reviewed and updated, no changes at this time. Okay to resume Voltaren gel for arthritis pain and Flexeril for muscle spasms as needed Your prescription(s) have been submitted to your pharmacy. Please take as directed and contact our office if you believe you are having problem(s) with the medication(s). Work on lifestyle changes as discussed (low fat, low carb, increased protein diet; improved exercise efforts; weight loss) to control sugar, blood pressure and cholesterol levels and/or reduce risk of developing other medical problems. Look into [email or web address] or other type of food journal to assist you in this process. Please schedule followup in 6 months for weight, sugar, and blood pressure check, call sooner if problems.  Health Maintenance, Females A healthy lifestyle and preventative care can promote health and wellness.  Maintain regular health, dental, and eye exams.  Eat a healthy diet. Foods like vegetables, fruits, whole grains, low-fat dairy products, and lean protein foods contain the nutrients you need without too many calories. Decrease your intake of foods high in solid fats, added sugars, and salt. Get information about a proper diet from your caregiver, if necessary.  Regular physical exercise is one of the most important things you can do for your health. Most adults should get at least 150 minutes of moderate-intensity exercise (any activity that increases your heart rate and causes you to sweat) each week. In addition, most adults need muscle-strengthening exercises on 2 or more days a week.   Maintain a healthy weight. The body mass index (BMI) is a screening tool to identify possible weight problems. It provides an  estimate of body fat based on height and weight. Your caregiver can help determine your BMI, and can help you achieve or maintain a healthy weight. For adults 20 years and older:  A BMI below 18.5 is considered underweight.  A BMI of 18.5 to 24.9 is normal.  A BMI of 25 to 29.9 is considered overweight.  A BMI of 30 and above is considered obese.  Maintain normal blood lipids and cholesterol by exercising and minimizing your intake of saturated fat. Eat a balanced diet with plenty of fruits and vegetables. Blood tests for lipids and cholesterol should begin at age 34 and be repeated every 5 years. If your lipid or cholesterol levels are high, you are over 50, or you are a high risk for heart disease, you may need your cholesterol levels checked more frequently.Ongoing high lipid and cholesterol levels should be treated with medicines if diet and exercise are not effective.  If you smoke, find out from your caregiver how to quit. If you do not use tobacco, do not start.  If you are pregnant, do not drink alcohol. If you are breastfeeding, be very cautious about drinking alcohol. If you are not pregnant and choose to drink alcohol, do not exceed 1 drink per day. One drink is considered to be 12 ounces (355 mL) of beer, 5 ounces (148 mL) of wine, or 1.5 ounces (44 mL) of liquor.  Avoid use of street drugs. Do not share needles with anyone. Ask for help if you need support or instructions about stopping the use of drugs.  High blood pressure causes heart disease and increases the risk of stroke. Blood pressure should be checked at least every 1 to 2 years. Ongoing high  blood pressure should be treated with medicines, if weight loss and exercise are not effective.  If you are 56 to 55 years old, ask your caregiver if you should take aspirin to prevent strokes.  Diabetes screening involves taking a blood sample to check your fasting blood sugar level. This should be done once every 3 years, after  age 92, if you are within normal weight and without risk factors for diabetes. Testing should be considered at a younger age or be carried out more frequently if you are overweight and have at least 1 risk factor for diabetes.  Breast cancer screening is essential preventative care for women. You should practice "breast self-awareness." This means understanding the normal appearance and feel of your breasts and may include breast self-examination. Any changes detected, no matter how small, should be reported to a caregiver. Women in their 70s and 30s should have a clinical breast exam (CBE) by a caregiver as part of a regular health exam every 1 to 3 years. After age 34, women should have a CBE every year. Starting at age 20, women should consider having a mammogram (breast X-ray) every year. Women who have a family history of breast cancer should talk to their caregiver about genetic screening. Women at a high risk of breast cancer should talk to their caregiver about having an MRI and a mammogram every year.  The Pap test is a screening test for cervical cancer. Women should have a Pap test starting at age 54. Between ages 57 and 23, Pap tests should be repeated every 2 years. Beginning at age 44, you should have a Pap test every 3 years as long as the past 3 Pap tests have been normal. If you had a hysterectomy for a problem that was not cancer or a condition that could lead to cancer, then you no longer need Pap tests. If you are between ages 49 and 60, and you have had normal Pap tests going back 10 years, you no longer need Pap tests. If you have had past treatment for cervical cancer or a condition that could lead to cancer, you need Pap tests and screening for cancer for at least 20 years after your treatment. If Pap tests have been discontinued, risk factors (such as a new sexual partner) need to be reassessed to determine if screening should be resumed. Some women have medical problems that increase  the chance of getting cervical cancer. In these cases, your caregiver may recommend more frequent screening and Pap tests.  The human papillomavirus (HPV) test is an additional test that may be used for cervical cancer screening. The HPV test looks for the virus that can cause the cell changes on the cervix. The cells collected during the Pap test can be tested for HPV. The HPV test could be used to screen women aged 53 years and older, and should be used in women of any age who have unclear Pap test results. After the age of 33, women should have HPV testing at the same frequency as a Pap test.  Colorectal cancer can be detected and often prevented. Most routine colorectal cancer screening begins at the age of 92 and continues through age 22. However, your caregiver may recommend screening at an earlier age if you have risk factors for colon cancer. On a yearly basis, your caregiver may provide home test kits to check for hidden blood in the stool. Use of a small camera at the end of a tube, to directly examine  the colon (sigmoidoscopy or colonoscopy), can detect the earliest forms of colorectal cancer. Talk to your caregiver about this at age 7, when routine screening begins. Direct examination of the colon should be repeated every 5 to 10 years through age 55, unless early forms of pre-cancerous polyps or small growths are found.  Hepatitis C blood testing is recommended for all people born from [birth year] through 1963 and any individual with known risks for hepatitis C.  Practice safe sex. Use condoms and avoid high-risk sexual practices to reduce the spread of sexually transmitted infections (STIs). Sexually active women aged 27 and younger should be checked for Chlamydia, which is a common sexually transmitted infection. Older women with new or multiple partners should also be tested for Chlamydia. Testing for other STIs is recommended if you are sexually active and at increased risk.  Osteoporosis is a  disease in which the bones lose minerals and strength with aging. This can result in serious bone fractures. The risk of osteoporosis can be identified using a bone density scan. Women ages 67 and over and women at risk for fractures or osteoporosis should discuss screening with their caregivers. Ask your caregiver whether you should be taking a calcium supplement or vitamin D to reduce the rate of osteoporosis.  Menopause can be associated with physical symptoms and risks. Hormone replacement therapy is available to decrease symptoms and risks. You should talk to your caregiver about whether hormone replacement therapy is right for you.  Use sunscreen with a sun protection factor (SPF) of 30 or greater. Apply sunscreen liberally and repeatedly throughout the day. You should seek shade when your shadow is shorter than you. Protect yourself by wearing long sleeves, pants, a wide-brimmed hat, and sunglasses year round, whenever you are outdoors.  Notify your caregiver of new moles or changes in moles, especially if there is a change in shape or color. Also notify your caregiver if a mole is larger than the size of a pencil eraser.  Stay current with your immunizations. Document Released: 11/06/2010 Document Revised: 07/16/2011 Document Reviewed: 11/06/2010 [facility] Patient Information 2013 [place], [place]. Exercise to Lose Weight Exercise and a healthy diet may help you lose weight. Your doctor may suggest specific exercises. EXERCISE IDEAS AND TIPS  Choose low-cost things you enjoy doing, such as walking, bicycling, or exercising to workout videos.  Take stairs instead of the elevator.  Walk during your lunch break.  Park your car further away from work or school.  Go to a gym or an exercise class.  Start with 5 to 10 minutes of exercise each day. Build up to 30 minutes of exercise 4 to 6 days a week.  Wear shoes with good support and comfortable clothes.  Stretch before and after working  out.  Work out until you breathe harder and your heart beats faster.  Drink extra water when you exercise.  Do not do so much that you hurt yourself, feel dizzy, or get very short of breath. Exercises that burn about 150 calories:  Running 1  miles in 15 minutes.  Playing volleyball for 45 to 60 minutes.  Washing and waxing a car for 45 to 60 minutes.  Playing touch football for 45 minutes.  Walking 1  miles in 35 minutes.  Pushing a stroller 1  miles in 30 minutes.  Playing basketball for 30 minutes.  Raking leaves for 30 minutes.  Bicycling 5 miles in 30 minutes.  Walking 2 miles in 30 minutes.  Dancing for 30 minutes.  Shoveling snow for 15 minutes.  Swimming laps for 20 minutes.  Walking up stairs for 15 minutes.  Bicycling 4 miles in 15 minutes.  Gardening for 30 to 45 minutes.  Jumping rope for 15 minutes.  Washing windows or floors for 45 to 60 minutes. Document Released: 05/26/2010 Document Revised: 07/16/2011 Document Reviewed: 05/26/2010 [facility] Patient Information 2013 [place], [place].

## 2012-07-28 NOTE — Assessment & Plan Note (Signed)
The patient is asked to make an attempt to improve diet and exercise patterns to aid in medical management of this problem. Lab Results  Component Value Date   HGBA1C 6.3 05/03/2011

## 2012-07-28 NOTE — Progress Notes (Signed)
Subjective:    Patient ID: Wendy Carr, female    DOB: 1955-09-24, 57 y.o.   MRN: [id number]  HPI patient is here today for annual physical. Patient feels well overall  Requests refill on prior voltaren gel - "works better" than pills  Past Medical History  Diagnosis Date  . GERD   . HYPERTENSION   . ALLERGIC RHINITIS   . ANEMIA-NOS   . ANXIETY   . ASTHMA   . HYPOTHYROIDISM   . HYPERGLYCEMIA, BORDERLINE   . Personal history of colonic polyps 2011    last colonscopy 2011: no polyps.   Family History  Problem Relation Age of Onset  . Hypertension Mother   . Osteoarthritis Mother   . Diabetes Sister   . Arthritis Other   . Pancreatic cancer Other   . Diabetes Other    History  Substance Use Topics  . Smoking status: Current Every Day Smoker -- 0.00 packs/day    Types: Cigarettes  . Smokeless tobacco: Not on file     Comment: Married 2009-lives with spouse, no kids  . Alcohol Use: No     Review of Systems Constitutional: Negative for fever or weight change.  Respiratory: Negative for cough and shortness of breath.   Cardiovascular: Negative for chest pain or palpitations.  Gastrointestinal: Negative for abdominal pain, no bowel changes.  Musculoskeletal: Negative for gait problem or joint swelling.  Skin: Negative for rash.  Neurological: Negative for dizziness or headache.  No other specific complaints in a complete review of systems (except as listed in HPI above).     Objective:   Physical Exam BP 120/78  Pulse 78  Temp(Src) 98.4 F (36.9 C) (Oral)  Ht 5' 2.75" (1.594 m)  Wt 231 lb 6.4 oz (104.962 kg)  BMI 41.31 kg/m2  SpO2 97% Wt Readings from Last 3 Encounters:  07/28/12 231 lb 6.4 oz (104.962 kg)  03/19/12 226 lb 8 oz (102.74 kg)  12/17/11 219 lb 12.8 oz (99.7 kg)   Constitutional: She is overweight, but appears well-developed and well-nourished. No distress.  HENT: Head: Normocephalic and atraumatic. Ears: B TMs ok, no erythema or effusion;  Nose: Nose normal. Mouth/Throat: Oropharynx is clear and moist. No oropharyngeal exudate.  Eyes: Conjunctivae and EOM are normal. Pupils are equal, round, and reactive to light. No scleral icterus.  Neck: Normal range of motion. Neck supple. No JVD present. No thyromegaly present.  Cardiovascular: Normal rate, regular rhythm and normal heart sounds.  No murmur heard. No BLE edema. Pulmonary/Chest: Effort normal and breath sounds normal. No respiratory distress. She has no wheezes.  Abdominal: Soft. Bowel sounds are normal. She exhibits no distension. There is no tenderness. no masses Musculoskeletal: R foot with well healed scars from prior podiatry surg - Normal range of motion, no joint effusions. No gross deformities Neurological: She is alert and oriented to person, place, and time. No cranial nerve deficit. Coordination normal.  Skin: Skin is warm and dry. No rash noted. No erythema.  Psychiatric: She has a normal mood and affect. Her behavior is normal. Judgment and thought content normal.   Lab Results  Component Value Date   WBC 6.7 07/22/2012   HGB 11.9* 07/22/2012   HCT 38.0 07/22/2012   PLT 180.0 07/22/2012   GLUCOSE 122* 07/22/2012   CHOL 191 07/22/2012   TRIG 71.0 07/22/2012   HDL 42.10 07/22/2012   LDLCALC 135* 07/22/2012   ALT 40* 07/22/2012   AST 33 07/22/2012   NA 137 07/22/2012   K  3.9 07/22/2012   CL 103 07/22/2012   CREATININE 1.0 07/22/2012   BUN 14 07/22/2012   CO2 26 07/22/2012   TSH 0.94 07/22/2012   HGBA1C 6.3 05/03/2011   ECG: sinus @ 67 bpm - poor R progression - no change from 12/17/11    Assessment & Plan:   CPX/v70.0 - Patient has been counseled on age-appropriate routine health concerns for screening and prevention. These are reviewed and up-to-date. Immunizations are up-to-date or declined. Labs and ECG reviewed. Employer work clearance signed today (copy scanned into EMR) - will refer to gyn for PAP/pelvic  See problem list. Medications and labs reviewed  today.

## 2012-07-28 NOTE — Assessment & Plan Note (Signed)
BP Readings from Last 3 Encounters:  07/28/12 120/78  03/19/12 118/66  12/19/11 150/80   The current medical regimen is generally effective;  continue present plan and medications.

## 2012-07-28 NOTE — Assessment & Plan Note (Signed)
chronic symptoms exac stressors at home (marriage) and work (changing jobs) Increased trazadone for sleep 12/2010 - also increase buspar 12/2010 and added prn xanax -  Continue celexa Denies si/hi

## 2012-07-30 ENCOUNTER — Encounter: Payer: Self-pay | Admitting: Obstetrics & Gynecology

## 2012-07-30 ENCOUNTER — Other Ambulatory Visit: Payer: Self-pay

## 2012-08-11 ENCOUNTER — Other Ambulatory Visit: Payer: Self-pay | Admitting: *Deleted

## 2012-08-11 MED ORDER — CITALOPRAM HYDROBROMIDE 40 MG PO TABS: 40.0000 mg | ORAL_TABLET | Freq: Every day | ORAL | Status: DC

## 2012-08-11 MED ORDER — LISINOPRIL-HYDROCHLOROTHIAZIDE 10-12.5 MG PO TABS: 1.0000 | ORAL_TABLET | Freq: Every day | ORAL | Status: DC

## 2012-08-11 NOTE — Telephone Encounter (Signed)
R'cd fax from [facility] for refill of Citalopram and Lisinopril-HCTZ.

## 2012-08-15 ENCOUNTER — Encounter: Payer: Self-pay | Admitting: Internal Medicine

## 2012-08-18 ENCOUNTER — Other Ambulatory Visit: Payer: Self-pay | Admitting: Internal Medicine

## 2012-08-25 ENCOUNTER — Encounter: Payer: 59 | Admitting: Obstetrics & Gynecology

## 2012-09-11 ENCOUNTER — Ambulatory Visit (INDEPENDENT_AMBULATORY_CARE_PROVIDER_SITE_OTHER): Payer: 59 | Admitting: Internal Medicine

## 2012-09-11 ENCOUNTER — Encounter: Payer: Self-pay | Admitting: Internal Medicine

## 2012-09-11 ENCOUNTER — Encounter: Payer: Self-pay | Admitting: *Deleted

## 2012-09-11 VITALS — BP 142/80 | HR 101 | Temp 99.1°F | Wt 229.8 lb

## 2012-09-11 DIAGNOSIS — J309 Allergic rhinitis, unspecified: Secondary | ICD-10-CM

## 2012-09-11 DIAGNOSIS — J45901 Unspecified asthma with (acute) exacerbation: Secondary | ICD-10-CM

## 2012-09-11 DIAGNOSIS — J45909 Unspecified asthma, uncomplicated: Secondary | ICD-10-CM

## 2012-09-11 MED ORDER — CETIRIZINE HCL 10 MG PO TABS: 10.0000 mg | ORAL_TABLET | Freq: Every day | ORAL | Status: DC

## 2012-09-11 MED ORDER — AZITHROMYCIN 250 MG PO TABS: ORAL_TABLET | ORAL | Status: DC

## 2012-09-11 MED ORDER — CYCLOBENZAPRINE HCL 5 MG PO TABS: 5.0000 mg | ORAL_TABLET | Freq: Three times a day (TID) | ORAL | Status: AC | PRN

## 2012-09-11 MED ORDER — ALBUTEROL SULFATE HFA 108 (90 BASE) MCG/ACT IN AERS: 2.0000 | INHALATION_SPRAY | Freq: Four times a day (QID) | RESPIRATORY_TRACT | Status: DC | PRN

## 2012-09-11 MED ORDER — HYDROCODONE-HOMATROPINE 5-1.5 MG/5ML PO SYRP: 5.0000 mL | ORAL_SOLUTION | Freq: Three times a day (TID) | ORAL | Status: DC | PRN

## 2012-09-11 MED ORDER — METHYLPREDNISOLONE ACETATE 80 MG/ML IJ SUSP
80.0000 mg | Freq: Once | INTRAMUSCULAR | Status: AC
  Administered 2012-09-11: 80 mg via INTRAMUSCULAR

## 2012-09-11 MED ORDER — BECLOMETHASONE DIPROPIONATE 40 MCG/ACT IN AERS: 2.0000 | INHALATION_SPRAY | Freq: Two times a day (BID) | RESPIRATORY_TRACT | Status: DC

## 2012-09-11 NOTE — Patient Instructions (Signed)
It was good to see you today. Medrol 80 mg shot given to you for asthma today and albuterol nebulized treatment Resume Qvar inhaler every day and take Zyrtec everyday for asthma and allergies Zpak antibiotics x next 5 days and prescription cough syrup for nighttime symptoms- Your prescription(s) have been submitted to your pharmacy. Please take as directed and contact our office if you believe you are having problem(s) with the medication(s). Alternate between ibuprofen and tylenol for aches, pain and fever symptoms as discussed Hydrate, rest and call if worse or unimproved Work excuse note for today as discussed

## 2012-09-11 NOTE — Progress Notes (Signed)
  Subjective:    Patient ID: Wendy Carr, female    DOB: 03-02-1956, 57 y.o.   MRN: [id number]  HPI  Patient presents to the office today with CC of asthma.  Patient states that for the past two weeks she has had worsening facial pain, pressure, congestion, cough, and wheezing.  Cough is mildly productive with yellow sputum.  Patient states that she has tried mucinex, and benadryl at home with little relief.  She states that she occasionally has problems with seasonal allergies, and has taken Allegra in the past with some relief.  Patient states that she has been using her albuterol inhaler 8-10 times per day with little relief.  Patient says that she has not been taking the Qvar for the past month as she ran out of it.  She denies fever and chills.  Patient requests a breathing treatment in the office today and a home nebulizer.     Review of Systems  Constitutional: Positive for fatigue. Negative for fever and chills.  HENT: Positive for ear pain, congestion, rhinorrhea, postnasal drip and sinus pressure.   Respiratory: Positive for cough, chest tightness, shortness of breath and wheezing.   Cardiovascular: Negative for chest pain and palpitations.  All other systems reviewed and are negative.       Objective:   Physical Exam  Nursing note and vitals reviewed. Constitutional: She is oriented to person, place, and time. She appears well-developed and well-nourished. No distress.  HENT:  Head: Normocephalic and atraumatic.  Right Ear: Hearing, external ear and ear canal normal. Tympanic membrane is not injected and not erythematous. A middle ear effusion is present.  Left Ear: Hearing, external ear and ear canal normal. Tympanic membrane is not injected and not erythematous. A middle ear effusion is present.  Nose: Mucosal edema present. Right sinus exhibits maxillary sinus tenderness and frontal sinus tenderness. Left sinus exhibits maxillary sinus tenderness and frontal sinus  tenderness.  Mouth/Throat: Uvula is midline. Posterior oropharyngeal erythema present.  Cobblestone erythema of the throat  Neck: Normal range of motion. Neck supple. No thyromegaly present.  Cardiovascular: Normal rate, regular rhythm and normal heart sounds.  Exam reveals no gallop and no friction rub.   No murmur heard. Pulmonary/Chest: Effort normal. No respiratory distress. She has wheezes. She has no rales. She exhibits no tenderness.  Mild end expiratory wheeze  Lymphadenopathy:    She has cervical adenopathy.  Neurological: She is alert and oriented to person, place, and time. No cranial nerve deficit.  Skin: Skin is warm and dry. She is not diaphoretic.  Psychiatric: She has a normal mood and affect. Her behavior is normal.          Assessment & Plan:  Patient presents to the office today with facial pain, cough, and exacerbation of her asthma.  Physical exam and history are consistent with allergic rhinitis and asthmatic bronchitis.  We will do 80 mg IM medrol injection today in the office and will do an albuterol nebulizer treatment in the office.  We will prescribe OTC Zyrtec, Azithromycin pack, Hydromet, and a refill of the Qvar today and albuterol today.  Patient to call in 1 week if symptoms not resolved or sooner if worsen.       I have personally reviewed this case with PA student. I also personally examined this patient. I agree with history and findings as documented above. I reviewed, discussed and approve of the assessment and plan as listed above. Rene Paci, MD

## 2012-09-11 NOTE — Progress Notes (Signed)
  Subjective:    Patient ID: Wendy Carr, female    DOB: 20-Sep-1955, 57 y.o.   MRN: [id number]  HPI  See CC and PA student note  Past Medical History  Diagnosis Date  . GERD   . HYPERTENSION   . ALLERGIC RHINITIS   . ANEMIA-NOS   . ANXIETY   . ASTHMA   . HYPOTHYROIDISM   . HYPERGLYCEMIA, BORDERLINE   . Personal history of colonic polyps 2011    last colonscopy 2011: no polyps.    Review of Systems  Constitutional: Negative for fever and fatigue.  HENT: Positive for rhinorrhea, sneezing, postnasal drip and sinus pressure.   Respiratory: Positive for cough, shortness of breath and wheezing.   Cardiovascular: Negative for chest pain and leg swelling.       Objective:   Physical Exam BP 142/80  Pulse 101  Temp(Src) 99.1 F (37.3 C) (Oral)  Wt 229 lb 12.8 oz (104.237 kg)  BMI 41.02 kg/m2  SpO2 97% Wt Readings from Last 3 Encounters:  09/11/12 229 lb 12.8 oz (104.237 kg)  07/28/12 231 lb 6.4 oz (104.962 kg)  03/19/12 226 lb 8 oz (102.74 kg)   Constitutional: She is overweight, but appears well-developed and well-nourished. No distress.  HENT: Head: Normocephalic and atraumatic. Mild B sinus tenderness Ears: B TMs ok, no erythema or effusion; Nose: Nose normal. Mouth/Throat: Oropharynx is clear and moist. +cobblestoning. No oropharyngeal exudate.  Eyes: Conjunctivae and EOM are normal. Pupils are equal, round, and reactive to light. No scleral icterus.  Neck: Normal range of motion. Neck supple. No JVD present. No thyromegaly present.  Cardiovascular: Normal rate, regular rhythm and normal heart sounds.  No murmur heard. No BLE edema. Pulmonary/Chest: Effort normal and breath sounds managed at bases with slight end expiratory wheeze. No respiratory distress. She has no crackles.  Skin: Skin is warm and dry. No rash noted. No erythema.  Psychiatric: She has a normal mood and affect. Her behavior is normal. Judgment and thought content normal.   Lab Results  Component  Value Date   WBC 6.7 07/22/2012   HGB 11.9* 07/22/2012   HCT 38.0 07/22/2012   PLT 180.0 07/22/2012   GLUCOSE 122* 07/22/2012   CHOL 191 07/22/2012   TRIG 71.0 07/22/2012   HDL 42.10 07/22/2012   LDLCALC 135* 07/22/2012   ALT 40* 07/22/2012   AST 33 07/22/2012   NA 137 07/22/2012   K 3.9 07/22/2012   CL 103 07/22/2012   CREATININE 1.0 07/22/2012   BUN 14 07/22/2012   CO2 26 07/22/2012   TSH 0.94 07/22/2012   HGBA1C 6.3 05/03/2011       Assessment & Plan:   Asthmatic bronchitis - precipitated by seasonal allergic flare and medication noncompliance  Alb neb and IM Medrol 80 in office today Resume inhaled steroid, refill on Qvar provided Empiric antibiotic with Z-Pak prescribed and hydromet Continue rescue inhaler as needed, no role for home nebulizer machine explained Antihistamine therapy prescribed - zyrtec Patient call symptoms worse or improved Work note for today provided

## 2012-09-15 ENCOUNTER — Other Ambulatory Visit: Payer: Self-pay | Admitting: Internal Medicine

## 2012-09-16 ENCOUNTER — Other Ambulatory Visit: Payer: Self-pay | Admitting: Internal Medicine

## 2012-09-16 NOTE — Telephone Encounter (Signed)
Faxed script back to [facility].../lmb 

## 2012-11-06 ENCOUNTER — Other Ambulatory Visit: Payer: Self-pay | Admitting: *Deleted

## 2012-11-06 DIAGNOSIS — F411 Generalized anxiety disorder: Secondary | ICD-10-CM

## 2012-11-06 MED ORDER — BUSPIRONE HCL 10 MG PO TABS: 20.0000 mg | ORAL_TABLET | Freq: Two times a day (BID) | ORAL | Status: DC

## 2012-11-06 MED ORDER — TRAZODONE HCL 50 MG PO TABS: ORAL_TABLET | ORAL | Status: DC

## 2012-11-18 ENCOUNTER — Telehealth: Payer: Self-pay | Admitting: Internal Medicine

## 2012-11-18 ENCOUNTER — Encounter: Payer: Self-pay | Admitting: Internal Medicine

## 2012-11-18 NOTE — Telephone Encounter (Signed)
Wendy Carr walked into the office requesting a letter for her employer documenting the date of her last Physical and TB skin test. Letter for patient has been done and is at the front desk for her to pick up tomorrow as we discussed.

## 2012-12-08 ENCOUNTER — Other Ambulatory Visit: Payer: Self-pay | Admitting: Internal Medicine

## 2012-12-08 NOTE — Telephone Encounter (Signed)
Faxed script bck to [facility]...Raechel Chute

## 2013-01-07 ENCOUNTER — Other Ambulatory Visit: Payer: Self-pay | Admitting: *Deleted

## 2013-01-07 MED ORDER — TRAZODONE HCL 50 MG PO TABS: ORAL_TABLET | ORAL | Status: DC

## 2013-01-08 ENCOUNTER — Other Ambulatory Visit: Payer: Self-pay | Admitting: *Deleted

## 2013-01-08 MED ORDER — TRAMADOL HCL 50 MG PO TABS: 50.0000 mg | ORAL_TABLET | Freq: Four times a day (QID) | ORAL | Status: DC | PRN

## 2013-01-08 NOTE — Telephone Encounter (Signed)
Faxed script back to [facility].../lmb 

## 2013-02-13 ENCOUNTER — Encounter: Payer: Self-pay | Admitting: Internal Medicine

## 2013-02-24 ENCOUNTER — Other Ambulatory Visit: Payer: Self-pay | Admitting: Internal Medicine

## 2013-02-24 NOTE — Telephone Encounter (Signed)
On 02/09/2013, pt just had this filled

## 2013-03-05 ENCOUNTER — Telehealth: Payer: Self-pay

## 2013-03-05 ENCOUNTER — Other Ambulatory Visit: Payer: Self-pay | Admitting: Internal Medicine

## 2013-03-05 NOTE — Telephone Encounter (Signed)
Please call pt and ask how often she is actually taking it please

## 2013-03-05 NOTE — Telephone Encounter (Signed)
Unable to contact patient or leave vc msg...will try again later...ds,cma

## 2013-03-05 NOTE — Telephone Encounter (Signed)
How often is she taking this medication?

## 2013-03-05 NOTE — Telephone Encounter (Signed)
Refill request for Alprazolam. Please advise.

## 2013-03-05 NOTE — Telephone Encounter (Signed)
1 tab 3x a day as needed for sleep or anxiety.

## 2013-03-06 NOTE — Telephone Encounter (Signed)
Unable to speak with patient or leave vc mail due to full mailbox...ds,cma

## 2013-03-10 ENCOUNTER — Telehealth: Payer: Self-pay | Admitting: Internal Medicine

## 2013-03-10 ENCOUNTER — Other Ambulatory Visit: Payer: Self-pay | Admitting: *Deleted

## 2013-03-10 MED ORDER — ALPRAZOLAM 0.5 MG PO TABS: 0.5000 mg | ORAL_TABLET | Freq: Three times a day (TID) | ORAL | Status: DC | PRN

## 2013-03-10 NOTE — Telephone Encounter (Signed)
Thanks for bringing this to my attention.  Patient should not be taking this 3 times a day every day -  this med was intended to be "as needed" for panic attack symptoms - 30 pills should last 30 days. Since her panic attacks are so frequent, we need to review other medications to control her symptoms. Please schedule an office visit to review

## 2013-03-10 NOTE — Telephone Encounter (Signed)
Called pt no answer LMOM with md response.../lmb 

## 2013-03-10 NOTE — Telephone Encounter (Signed)
Faxed script back to [facility].../lmb 

## 2013-03-10 NOTE — Telephone Encounter (Signed)
Pt called back about her alprazolam (xanex).  Pt takes this 3 times a day.  She states she picks it up [facility] tablets [facility].  She is out of it. You can reach her on her cell from 12-1:30 today.

## 2013-03-16 ENCOUNTER — Other Ambulatory Visit: Payer: 59

## 2013-03-16 ENCOUNTER — Encounter: Payer: Self-pay | Admitting: Internal Medicine

## 2013-03-16 ENCOUNTER — Ambulatory Visit (INDEPENDENT_AMBULATORY_CARE_PROVIDER_SITE_OTHER): Payer: 59 | Admitting: Internal Medicine

## 2013-03-16 VITALS — BP 168/90 | HR 78 | Temp 97.3°F | Wt 234.0 lb

## 2013-03-16 DIAGNOSIS — F411 Generalized anxiety disorder: Secondary | ICD-10-CM

## 2013-03-16 DIAGNOSIS — R05 Cough: Secondary | ICD-10-CM

## 2013-03-16 DIAGNOSIS — Z79899 Other long term (current) drug therapy: Secondary | ICD-10-CM

## 2013-03-16 DIAGNOSIS — L84 Corns and callosities: Secondary | ICD-10-CM

## 2013-03-16 MED ORDER — ALPRAZOLAM 0.5 MG PO TABS: 0.5000 mg | ORAL_TABLET | Freq: Three times a day (TID) | ORAL | Status: DC | PRN

## 2013-03-16 MED ORDER — BENZONATATE 200 MG PO CAPS: 200.0000 mg | ORAL_CAPSULE | Freq: Three times a day (TID) | ORAL | Status: DC | PRN

## 2013-03-16 NOTE — Progress Notes (Signed)
Pre-visit discussion using our clinic review tool. No additional management support is needed unless otherwise documented below in the visit note.  

## 2013-03-16 NOTE — Progress Notes (Signed)
  Subjective:    Patient ID: Wendy Carr, female    DOB: 12/10/1955, 57 y.o.   MRN: [id number]  Cough This is a new problem. The current episode started 1 to 4 weeks ago. The problem has been rapidly improving. The cough is non-productive. Associated symptoms include postnasal drip. Pertinent negatives include no chest pain, chills, fever, shortness of breath or wheezing. The symptoms are aggravated by cold air. She has tried OTC cough suppressant and a beta-agonist inhaler for the symptoms. The treatment provided mild relief. Her past medical history is significant for asthma and environmental allergies.   Also see CC  Past Medical History  Diagnosis Date  . GERD   . HYPERTENSION   . ALLERGIC RHINITIS   . ANEMIA-NOS   . ANXIETY   . ASTHMA   . HYPOTHYROIDISM   . HYPERGLYCEMIA, BORDERLINE   . Personal history of colonic polyps 2011    last colonscopy 2011: no polyps.    Review of Systems  Constitutional: Negative for fever, chills and fatigue.  HENT: Positive for postnasal drip, sinus pressure and sneezing.   Respiratory: Positive for cough. Negative for shortness of breath and wheezing.   Cardiovascular: Negative for chest pain and leg swelling.  Allergic/Immunologic: Positive for environmental allergies.       Objective:   Physical Exam BP 168/90  Pulse 78  Temp(Src) 97.3 F (36.3 C) (Oral)  Wt 234 lb (106.142 kg)  SpO2 98% Wt Readings from Last 3 Encounters:  03/16/13 234 lb (106.142 kg)  09/11/12 229 lb 12.8 oz (104.237 kg)  07/28/12 231 lb 6.4 oz (104.962 kg)   Constitutional: She is overweight, but appears well-developed and well-nourished. No distress.  HENT: Head: Normocephalic and atraumatic. no sinus tenderness Ears: B TMs ok, no erythema or effusion; Nose: Nose normal. Mouth/Throat: Oropharynx is clear and moist. No oropharyngeal exudate.  Eyes: Conjunctivae and EOM are normal. Pupils are equal, round, and reactive to light. No scleral icterus.  Neck:  Normal range of motion. Neck supple. No JVD present. No thyromegaly present.  Cardiovascular: Normal rate, regular rhythm and normal heart sounds.  No murmur heard. No BLE edema. Pulmonary/Chest: Effort normal and breath sounds normal -no wheeze. No respiratory distress. She has no crackles.  Skin: Skin is warm and dry. No rash noted. No erythema. large corn L 5th toe lateral edge Psychiatric: She has an anxious mood and affect. Her behavior is normal. Judgment and thought content normal.   Lab Results  Component Value Date   WBC 6.7 07/22/2012   HGB 11.9* 07/22/2012   HCT 38.0 07/22/2012   PLT 180.0 07/22/2012   GLUCOSE 122* 07/22/2012   CHOL 191 07/22/2012   TRIG 71.0 07/22/2012   HDL 42.10 07/22/2012   LDLCALC 135* 07/22/2012   ALT 40* 07/22/2012   AST 33 07/22/2012   NA 137 07/22/2012   K 3.9 07/22/2012   CL 103 07/22/2012   CREATININE 1.0 07/22/2012   BUN 14 07/22/2012   CO2 26 07/22/2012   TSH 0.94 07/22/2012   HGBA1C 6.3 05/03/2011       Assessment & Plan:   See problem list. Medications and labs reviewed today.  Corn - refer to podiatry per request  Also advised OTC tx options  Cough - recent vial URI reviewed - evidence for persisting bacterial infection or in antibiotics. Provide Tessalon Perles to use as needed. Patient call symptoms worse or unimproved

## 2013-03-16 NOTE — Patient Instructions (Signed)
It was good to see you today.  We have reviewed your prior records including labs and tests today  Test(s) ordered today. Your results will be released to MyChart (or called to you) after review, usually within 72hours after test completion. If any changes need to be made, you will be notified at that same time.  Medications reviewed and updated, will increase Xanax to 60 available per month and use Tessalon as needed for cough -no other changes recommended at this time.  Your prescription(s) have been submitted to your pharmacy. (Xanax prescription given to you) Please take as directed and contact our office if you believe you are having problem(s) with the medication(s).  Please schedule followup in 3-4 months to review anxiety symptoms and medications, call sooner if problems.  we'll make referral to podiatry for your foot/corn. Our office will contact you regarding appointment(s) once made.

## 2013-03-16 NOTE — Assessment & Plan Note (Signed)
chronic symptoms exac stressors at home (marriage) and work (changing jobs) Increased trazadone for sleep 12/2010 -  also increase buspar 12/2010 and added prn xanax -  Requesting increase # xanax/mo Discussed alternate therapies and history of substance abuse as evidenced hospitalization August 2013 Will check urine drug screen and increase to 60 per month, no really refills will be provided Continue celexa Denies si/hi Declines need for behavioral health counseling or therapy at this time

## 2013-04-01 ENCOUNTER — Other Ambulatory Visit: Payer: Self-pay | Admitting: Internal Medicine

## 2013-04-13 ENCOUNTER — Other Ambulatory Visit: Payer: Self-pay | Admitting: Internal Medicine

## 2013-05-14 ENCOUNTER — Other Ambulatory Visit: Payer: Self-pay | Admitting: Internal Medicine

## 2013-06-15 ENCOUNTER — Other Ambulatory Visit: Payer: Self-pay | Admitting: Internal Medicine

## 2013-06-16 NOTE — Telephone Encounter (Signed)
Faxed back to [facility].../lmb 

## 2013-07-02 ENCOUNTER — Other Ambulatory Visit: Payer: Self-pay | Admitting: Internal Medicine

## 2013-07-03 NOTE — Telephone Encounter (Signed)
Faxed script back to [facility].../lmb 

## 2013-07-17 ENCOUNTER — Other Ambulatory Visit: Payer: Self-pay | Admitting: Internal Medicine

## 2013-07-17 DIAGNOSIS — Z1231 Encounter for screening mammogram for malignant neoplasm of breast: Secondary | ICD-10-CM

## 2013-07-22 ENCOUNTER — Ambulatory Visit (HOSPITAL_COMMUNITY)
Admission: RE | Admit: 2013-07-22 | Discharge: 2013-07-22 | Disposition: A | Discharge status: Home / Self Care | Payer: 59 | Source: Ambulatory Visit | Attending: Internal Medicine | Admitting: Internal Medicine

## 2013-07-22 DIAGNOSIS — Z1231 Encounter for screening mammogram for malignant neoplasm of breast: Secondary | ICD-10-CM | POA: Insufficient documentation

## 2013-08-12 ENCOUNTER — Ambulatory Visit (INDEPENDENT_AMBULATORY_CARE_PROVIDER_SITE_OTHER): Payer: 59 | Admitting: Emergency Medicine

## 2013-08-12 ENCOUNTER — Other Ambulatory Visit: Payer: Self-pay | Admitting: Internal Medicine

## 2013-08-12 ENCOUNTER — Telehealth: Payer: Self-pay | Admitting: Internal Medicine

## 2013-08-12 VITALS — BP 126/80 | HR 76 | Temp 98.0°F | Resp 17 | Ht 63.5 in | Wt 239.0 lb

## 2013-08-12 DIAGNOSIS — Z0000 Encounter for general adult medical examination without abnormal findings: Secondary | ICD-10-CM

## 2013-08-12 DIAGNOSIS — Z111 Encounter for screening for respiratory tuberculosis: Secondary | ICD-10-CM

## 2013-08-12 NOTE — Telephone Encounter (Signed)
Relevant patient education mailed to patient.  

## 2013-08-12 NOTE — Progress Notes (Signed)
[facility] [address] [place] [address] [phone]  Date:  08/12/2013   Name:  Tyresa Prindiville   DOB:  1955/10/16   MRN:  [id number]  PCP:  Gwendolyn Grant, MD    Chief Complaint: Employment Physical   History of Present Illness:  Vaani Morren is a 58 y.o. very pleasant female patient who presents with the following:  For wellness examination  No complaints.  Just had labs at [facility].    Patient Active Problem List   Diagnosis Date Noted  . Opiate withdrawal 12/16/2011  . Cocaine abuse 12/16/2011  . BACK PAIN 09/03/2008  . HYPERGLYCEMIA, BORDERLINE 09/03/2008  . ANEMIA-NOS 08/20/2008  . ANXIETY 08/20/2008  . HYPERTENSION 08/20/2008  . ALLERGIC RHINITIS 08/20/2008  . ASTHMA 08/20/2008  . GERD 08/20/2008    Past Medical History  Diagnosis Date  . GERD   . HYPERTENSION   . ALLERGIC RHINITIS   . ANEMIA-NOS   . ANXIETY   . ASTHMA   . HYPOTHYROIDISM   . HYPERGLYCEMIA, BORDERLINE   . Personal history of colonic polyps 2011    last colonscopy 2011: no polyps.    Past Surgical History  Procedure Laterality Date  . Myomectomy  1997  . Breast surgery  2004    Lumpectomy, right side-Benign  . Thyroidectomy, partial  2004    Left, benign  . Vein surgery  1997    left arm  . Foot surgery  2006    right  . Hernia repair  1995    [facility]    History  Substance Use Topics  . Smoking status: Current Every Day Smoker -- 0.40 packs/day for 2 years    Types: Cigarettes  . Smokeless tobacco: Not on file     Comment: Married 2009-lives with spouse, no kids  . Alcohol Use: No    Family History  Problem Relation Age of Onset  . Hypertension Mother   . Osteoarthritis Mother   . Diabetes Sister   . Arthritis Other   . Pancreatic cancer Other   . Diabetes Other     Allergies  Allergen Reactions  . Naproxen Other (See Comments)    Stomach cramps     Medication list has been reviewed and updated.  Current Outpatient  Prescriptions on File Prior to Visit  Medication Sig Dispense Refill  . albuterol (PROVENTIL HFA;VENTOLIN HFA) 108 (90 BASE) MCG/ACT inhaler Inhale 2 puffs into the lungs 4 (four) times daily as needed. For shortness of breath  18 g  2  . ALPRAZolam (XANAX) 0.5 MG tablet TAKE 1 TABLET BY MOUTH 3 TIMES DAILY AS NEEDED FOR SLEEP OR ANXIETY  60 tablet  1  . beclomethasone (QVAR) 40 MCG/ACT inhaler Inhale 2 puffs into the lungs 2 (two) times daily.  1 Inhaler  0  . busPIRone (BUSPAR) 10 MG tablet Take 2 tablets (20 mg total) by mouth 2 (two) times daily.  120 tablet  10  . cetirizine (ZYRTEC) 10 MG tablet Take 1 tablet (10 mg total) by mouth daily.  30 tablet  11  . citalopram (CELEXA) 40 MG tablet TAKE 1 TABLET BY MOUTH ONCE DAILY  30 tablet  5  . cyclobenzaprine (FLEXERIL) 5 MG tablet Take 1 tablet (5 mg total) by mouth every 8 (eight) hours as needed for muscle spasms.  30 tablet  2  . lisinopril-hydrochlorothiazide (PRINZIDE,ZESTORETIC) 10-12.5 MG per tablet TAKE 1 TABLET BY MOUTH DAILY.  30 tablet  5  . traMADol (ULTRAM)  50 MG tablet TAKE 1 TABLET BY MOUTH EVERY 6 HOURS AS NEEDED FOR PAIN  60 tablet  PRN  . traZODone (DESYREL) 50 MG tablet TAKE 1 TABLET BY MOUTH AT BEDTIME.  30 tablet  2  . benzonatate (TESSALON) 200 MG capsule Take 1 capsule (200 mg total) by mouth 3 (three) times daily as needed for cough.  30 capsule  1  . diclofenac sodium (VOLTAREN) 1 % GEL Apply 2 g topically 3 (three) times daily. Apply to painful area on right foot three times a day as needed  100 g  1   No current facility-administered medications on file prior to visit.    Review of Systems:  As per HPI, otherwise negative.    Physical Examination: Filed Vitals:   08/12/13 1359  BP: 126/80  Pulse: 76  Temp: 98 F (36.7 C)  Resp: 17   Filed Vitals:   08/12/13 1359  Height: 5' 3.5" (1.613 m)  Weight: 239 lb (108.41 kg)   Body mass index is 41.67 kg/(m^2). Ideal Body Weight: Weight in (lb) to have BMI =  25: 143.1  GEN: WDWN, NAD, Non-toxic, A & O x 3 HEENT: Atraumatic, Normocephalic. Neck supple. No masses, No LAD. Ears and Nose: No external deformity. CV: RRR, No M/G/R. No JVD. No thrill. No extra heart sounds. PULM: CTA B, no wheezes, crackles, rhonchi. No retractions. No resp. distress. No accessory muscle use. ABD: S, NT, ND, +BS. No rebound. No HSM. EXTR: No c/c/e NEURO Normal gait.  PSYCH: Normally interactive. Conversant. Not depressed or anxious appearing.  Calm demeanor.    Assessment and Plan: Wellness exam Signed,  Ellison Carwin, MD

## 2013-08-12 NOTE — Progress Notes (Signed)
  Tuberculosis Risk Questionnaire  1. No Were you born outside the [place] in one of the following parts of the world: [place], [place], [place], [place] or [name]?    2. No Have you traveled outside the [place] and lived for more than one month in one of the following parts of the world: [place], [place], [place], [place] or [name]?    3. No Do you have a compromised immune system such as from any of the following conditions:HIV/AIDS, organ or bone marrow transplantation, diabetes, immunosuppressive medicines (e.g. Prednisone, Remicaide), leukemia, lymphoma, cancer of the head or neck, gastrectomy or jejunal bypass, end-stage renal disease (on dialysis), or silicosis?     4. No Have you ever or do you plan on working in: a residential care center, a health care facility, a jail or prison or homeless shelter?    5. No Have you ever: injected illegal drugs, used crack cocaine, lived in a homeless shelter  or been in jail or prison?     6. No Have you ever been exposed to anyone with infectious tuberculosis?    Tuberculosis Symptom Questionnaire  Do you currently have any of the following symptoms?  1. Yes patient states she has a hx of asthma Unexplained cough lasting more than 3 weeks?   2. No Unexplained fever lasting more than 3 weeks.   3. No Night Sweats (sweating that leaves the bedclothes and sheets wet)     4. No Shortness of Breath   5. No Chest Pain   6. No Unintentional weight loss    7. No Unexplained fatigue (very tired for no reason)

## 2013-08-14 ENCOUNTER — Ambulatory Visit (INDEPENDENT_AMBULATORY_CARE_PROVIDER_SITE_OTHER): Payer: 59 | Admitting: Radiology

## 2013-08-14 DIAGNOSIS — Z111 Encounter for screening for respiratory tuberculosis: Secondary | ICD-10-CM

## 2013-08-14 LAB — TB SKIN TEST
Induration: 0 mm
TB Skin Test: NEGATIVE

## 2013-08-24 ENCOUNTER — Ambulatory Visit: Payer: Self-pay | Admitting: Internal Medicine

## 2013-08-24 DIAGNOSIS — Z0289 Encounter for other administrative examinations: Secondary | ICD-10-CM

## 2013-08-25 ENCOUNTER — Other Ambulatory Visit: Payer: Self-pay | Admitting: *Deleted

## 2013-08-25 MED ORDER — ALPRAZOLAM 0.5 MG PO TABS: ORAL_TABLET | ORAL | Status: DC

## 2013-08-25 NOTE — Telephone Encounter (Signed)
Alprazolam script has been faxed to [facility] outpatient pharmacy [phone]

## 2013-09-25 ENCOUNTER — Other Ambulatory Visit: Payer: Self-pay | Admitting: Internal Medicine

## 2013-09-26 ENCOUNTER — Ambulatory Visit (INDEPENDENT_AMBULATORY_CARE_PROVIDER_SITE_OTHER): Payer: 59 | Admitting: Family Medicine

## 2013-09-26 VITALS — BP 124/70 | HR 73 | Temp 98.2°F | Ht 62.75 in | Wt 233.4 lb

## 2013-09-26 DIAGNOSIS — K029 Dental caries, unspecified: Secondary | ICD-10-CM

## 2013-09-26 DIAGNOSIS — K089 Disorder of teeth and supporting structures, unspecified: Secondary | ICD-10-CM

## 2013-09-26 DIAGNOSIS — K0889 Other specified disorders of teeth and supporting structures: Secondary | ICD-10-CM

## 2013-09-26 MED ORDER — HYDROCODONE-ACETAMINOPHEN 5-325 MG PO TABS: 1.0000 | ORAL_TABLET | Freq: Four times a day (QID) | ORAL | Status: DC | PRN

## 2013-09-26 MED ORDER — AMOXICILLIN 500 MG PO CAPS: 500.0000 mg | ORAL_CAPSULE | Freq: Three times a day (TID) | ORAL | Status: DC

## 2013-09-26 NOTE — Patient Instructions (Signed)
Start antibiotic until seen by dentist, but does not appear infected at this point. Hydrocodone only if needed for more severe pain, but transition back to ultram as soon as possible for pain relief due to the concerns we addressed.   If any swelling around tooth, fever, or other worsening - return prior to dentis evaluation in 4 days.   Return to the clinic or go to the nearest emergency room if any of your symptoms worsen or new symptoms occur.  Dental Caries  Dental caries (also called tooth decay) is the most common oral disease. It can occur at any age, but is more common in children and young adults.  HOW DENTAL CARIES DEVELOPS  The process of decay begins when bacteria and foods (particularly sugars and starches) combine in your mouth to produce plaque. Plaque is a substance that sticks to the hard, outer surface of a tooth (enamel). The bacteria in plaque produce acids that attack enamel. These acids may also attack the root surface of a tooth (cementum) if it is exposed. Repeated attacks dissolve these surfaces and create holes in the tooth (cavities). If left untreated, the acids destroy the other layers of the tooth.  RISK FACTORS  Frequent sipping of sugary beverages.   Frequent snacking on sugary and starchy foods, especially those that easily get stuck in the teeth.   Poor oral hygiene.   Dry mouth.   Substance abuse such as methamphetamine abuse.   Broken or poor-fitting dental restorations.   Eating disorders.   Gastroesophageal reflux disease (GERD).   Certain radiation treatments to the head and neck. SYMPTOMS In the early stages of dental caries, symptoms are seldom present. Sometimes white, chalky areas may be seen on the enamel or other tooth layers. In later stages, symptoms may include:  Pits and holes on the enamel.  Toothache after sweet, hot, or cold foods or drinks are consumed.  Pain around the tooth.  Swelling around the tooth. DIAGNOSIS   Most of the time, dental caries is detected during a regular dental checkup. A diagnosis is made after a thorough medical and dental history is taken and the surfaces of your teeth are checked for signs of dental caries. Sometimes special instruments, such as lasers, are used to check for dental caries. Dental X-ray exams may be taken so that areas not visible to the eye (such as between the contact areas of the teeth) can be checked for cavities.  TREATMENT  If dental caries is in its early stages, it may be reversed with a fluoride treatment or an application of a remineralizing agent at the dental office. Thorough brushing and flossing at home is needed to aid these treatments. If it is in its later stages, treatment depends on the location and extent of tooth destruction:   If a small area of the tooth has been destroyed, the destroyed area will be removed and cavities will be filled with a material such as gold, silver amalgam, or composite resin.   If a large area of the tooth has been destroyed, the destroyed area will be removed and a cap (crown) will be fitted over the remaining tooth structure.   If the center part of the tooth (pulp) is affected, a procedure called a root canal will be needed before a filling or crown can be placed.   If most of the tooth has been destroyed, the tooth may need to be pulled (extracted). HOME CARE INSTRUCTIONS You can prevent, stop, or reverse dental caries  at home by practicing good oral hygiene. Good oral hygiene includes:  Thoroughly cleaning your teeth at least twice a day with a toothbrush and dental floss.   Using a fluoride toothpaste. A fluoride mouth rinse may also be used if recommended by your dentist or health care provider.   Restricting the amount of sugary and starchy foods and sugary liquids you consume.   Avoiding frequent snacking on these foods and sipping of these liquids.   Keeping regular visits with a dentist for  checkups and cleanings. PREVENTION   Practice good oral hygiene.  Consider a dental sealant. A dental sealant is a coating material that is applied by your dentist to the pits and grooves of teeth. The sealant prevents food from being trapped in them. It may protect the teeth for several years.  Ask about fluoride supplements if you live in a community without fluorinated water or with water that has a low fluoride content. Use fluoride supplements as directed by your dentist or health care provider.  Allow fluoride varnish applications to teeth if directed by your dentist or health care provider. Document Released: 01/13/2002 Document Revised: 12/24/2012 Document Reviewed: 04/25/2012 [facility] Patient Information 2014 [place].

## 2013-09-26 NOTE — Progress Notes (Addendum)
Subjective:    Patient ID: Wendy Carr, female    DOB: [birth year], 58 y.o.   MRN: [id number]  This chart was scribed for Wendy Agreste, MD by Erling Conte, Medical Scribe. This patient was seen in Room 11 and the patient's care was started at 1:30 PM.  Authored by Janeann Forehand, MD - unable to change in [facility].   Chief Complaint  Patient presents with   Dental Pain     HPI  HPI Comments: Wendy Carr is a 58 y.o. female who presents to the [facility] complaining of tooth pain on her bottom right tooth. She states that tooth pain has been radiating to her ears. Filling fell out about a month and half ago. Patient states that pain became gradually worse this week. She states that she has a dental appointment on Wednesday of next week (09/30/13). She states she went to the Pharmacy and got something to help keep the filling in place but she states that it did not help. Patient states the filling has been exposed or open for the past week. Patient states she has taken Goody powder, BC, about every 2 hours with no relief. She also states that she applied a teabag to the tooth and that provided relief but she states that the pain returned this morning. She states that she takes Tramadol, 2x daily for headaches and has taken it to help with tooth pain and she states that it did not help. She states that the past 2-3 days she began taking the Tramadol every 6 hours to see if that would help with the pain but it did not. She denies any associated fever.  CHL reviewed, noted admission to hospital in August of 2013 for opiate, cocaine, benzo overdose and withdrawal. She was admitted from August 11th to August 14th and discharged to [facility] Patient states that she has been clean for 1 year now. She reports that's she keeps up with her sponsor and she goes to meetings every day to help with her addiction.     Patient Active Problem List   Diagnosis Date Noted   Opiate withdrawal 12/16/2011   Cocaine abuse 12/16/2011   BACK PAIN 09/03/2008   HYPERGLYCEMIA, BORDERLINE 09/03/2008   ANEMIA-NOS 08/20/2008   ANXIETY 08/20/2008   HYPERTENSION 08/20/2008   ALLERGIC RHINITIS 08/20/2008   ASTHMA 08/20/2008   GERD 08/20/2008   Past Medical History  Diagnosis Date   GERD    HYPERTENSION    ALLERGIC RHINITIS    ANEMIA-NOS    ANXIETY    ASTHMA    HYPOTHYROIDISM    HYPERGLYCEMIA, BORDERLINE    Personal history of colonic polyps 2011    last colonscopy 2011: no polyps.   Past Surgical History  Procedure Laterality Date   Myomectomy  1997   Breast surgery  2004    Lumpectomy, right side-Benign   Thyroidectomy, partial  2004    Left, benign   Vein surgery  1997    left arm   Foot surgery  2006    right   Hernia repair  1995    [facility] ?[place]   Allergies  Allergen Reactions   Naproxen Other (See Comments)    Stomach cramps    Prior to Admission medications   Medication Sig Start Date End Date Taking? Authorizing Provider  ALPRAZolam (XANAX) 0.5 MG tablet TAKE 1 TABLET BY MOUTH 3 TIMES DAILY AS NEEDED FOR SLEEP OR ANXIETY 08/25/13  Yes  Rowe Clack, MD  beclomethasone (QVAR) 40 MCG/ACT inhaler Inhale 2 puffs into the lungs 2 (two) times daily. 09/11/12  Yes Rowe Clack, MD  benzonatate (TESSALON) 200 MG capsule Take 1 capsule (200 mg total) by mouth 3 (three) times daily as needed for cough. 03/16/13  Yes Rowe Clack, MD  busPIRone (BUSPAR) 10 MG tablet Take 2 tablets (20 mg total) by mouth 2 (two) times daily. 11/06/12 11/06/13 Yes Rowe Clack, MD  cetirizine (ZYRTEC) 10 MG tablet Take 1 tablet (10 mg total) by mouth daily. 09/11/12  Yes Rowe Clack, MD  citalopram (CELEXA) 40 MG tablet TAKE 1 TABLET BY MOUTH ONCE DAILY 04/13/13  Yes Rowe Clack, MD  diclofenac sodium (VOLTAREN) 1 % GEL Apply 2 g topically 3 (three) times daily. Apply to painful area on right  foot three times a day as needed 07/28/12  Yes Rowe Clack, MD  lisinopril-hydrochlorothiazide (PRINZIDE,ZESTORETIC) 10-12.5 MG per tablet TAKE 1 TABLET BY MOUTH DAILY. 09/25/13  Yes Rowe Clack, MD  traMADol (ULTRAM) 50 MG tablet TAKE 1 TABLET BY MOUTH EVERY 6 HOURS AS NEEDED FOR PAIN 06/15/13  Yes Rowe Clack, MD  traZODone (DESYREL) 50 MG tablet TAKE 1 TABLET BY MOUTH AT BEDTIME. 08/12/13  Yes Rowe Clack, MD  VENTOLIN HFA 108 (90 BASE) MCG/ACT inhaler INHALE 2 PUFFS BY MOUTH 4 TIMES DAILY AS NEEDED FOR SHORTNESS OF BREATH 09/25/13  Yes Rowe Clack, MD   History   Social History   Marital Status: Married    Spouse Name: N/A    Number of Children: N/A   Years of Education: N/A   Occupational History   Not on file.   Social History Main Topics   Smoking status: Current Every Day Smoker -- 0.40 packs/day for 2 years    Types: Cigarettes   Smokeless tobacco: Not on file     Comment: Married 2009-lives with spouse, no kids   Alcohol Use: No   Drug Use: Yes    Special: Cocaine     Comment: Herion   Sexual Activity: No   Other Topics Concern   Not on file   Social History Narrative   No narrative on file       Review of Systems  Constitutional: Negative for fever.  HENT: Positive for dental problem (right lower tooth pain).   All other systems reviewed and are negative.      Objective:   Physical Exam  Nursing note and vitals reviewed. Constitutional: She is oriented to person, place, and time. She appears well-developed and well-nourished. No distress.  HENT:  Head: Normocephalic and atraumatic.  Dental decay along the canine and 1st pre molar on the right lower jaw line No surrounding gum edema or erythema. No sign of abscess. Decay at base of first premolar on buccal side of tooth. Gums are non-tender  Eyes: EOM are normal.  Neck: Neck supple. No tracheal deviation present.  No lymphadenopathy in neck  Cardiovascular:  Normal rate, regular rhythm and normal heart sounds.   Pulmonary/Chest: Effort normal and breath sounds normal. No respiratory distress.  CTA  Musculoskeletal: Normal range of motion.  Neurological: She is alert and oriented to person, place, and time.  Skin: Skin is warm and dry.  Psychiatric: She has a normal mood and affect. Her behavior is normal.      Filed Vitals:   09/26/13 1249  BP: 124/70  Pulse: 73  Temp: 98.2 F (36.8 C)  TempSrc: Oral  Height: 5' 2.75" (1.594 m)  Weight: 233 lb 6.4 oz (105.87 kg)  SpO2: 100%   Controlled substance database noted: Single prescriber recently, Dr. Asa Lente, with most recent prescription for #60 Tramadol 50 mg prescribed on May 7th 2015.     Assessment & Plan:   Rogena Gollehon is a 58 y.o. female Tooth pain - Plan: HYDROcodone-acetaminophen (NORCO/VICODIN) 5-325 MG per tablet, amoxicillin (AMOXIL) 500 MG capsule  Dental caries - Plan: amoxicillin (AMOXIL) 500 MG capsule  Decay without sign of abscess at this time, but dental follow up in 4 days. Cover with amoxicillin until that time.  Discussed prior addiction and concerns with narcotic use, but she has been doing well with maintaining sobriety.  Agreed on hydrocodone for breakthrough pain - short course and transition to ultram as pain tolerates. Rtc/er precautions. Keep follow up with dentist in 4 days.   Meds ordered this encounter  Medications   HYDROcodone-acetaminophen (NORCO/VICODIN) 5-325 MG per tablet    Sig: Take 1 tablet by mouth every 6 (six) hours as needed for moderate pain.    Dispense:  15 tablet    Refill:  0   amoxicillin (AMOXIL) 500 MG capsule    Sig: Take 1 capsule (500 mg total) by mouth 3 (three) times daily.    Dispense:  30 capsule    Refill:  0   Patient Instructions  Start antibiotic until seen by dentist, but does not appear infected at this point. Hydrocodone only if needed for more severe pain, but transition back to ultram as soon as possible  for pain relief due to the concerns we addressed.   If any swelling around tooth, fever, or other worsening - return prior to dentis evaluation in 4 days.   Return to the clinic or go to the nearest emergency room if any of your symptoms worsen or new symptoms occur.  Dental Caries  Dental caries (also called tooth decay) is the most common oral disease. It can occur at any age, but is more common in children and young adults.  HOW DENTAL CARIES DEVELOPS  The process of decay begins when bacteria and foods (particularly sugars and starches) combine in your mouth to produce plaque. Plaque is a substance that sticks to the hard, outer surface of a tooth (enamel). The bacteria in plaque produce acids that attack enamel. These acids may also attack the root surface of a tooth (cementum) if it is exposed. Repeated attacks dissolve these surfaces and create holes in the tooth (cavities). If left untreated, the acids destroy the other layers of the tooth.  RISK FACTORS  Frequent sipping of sugary beverages.   Frequent snacking on sugary and starchy foods, especially those that easily get stuck in the teeth.   Poor oral hygiene.   Dry mouth.   Substance abuse such as methamphetamine abuse.   Broken or poor-fitting dental restorations.   Eating disorders.   Gastroesophageal reflux disease (GERD).   Certain radiation treatments to the head and neck. SYMPTOMS In the early stages of dental caries, symptoms are seldom present. Sometimes white, chalky areas may be seen on the enamel or other tooth layers. In later stages, symptoms may include:  Pits and holes on the enamel.  Toothache after sweet, hot, or cold foods or drinks are consumed.  Pain around the tooth.  Swelling around the tooth. DIAGNOSIS  Most of the time, dental caries is detected during a regular dental checkup. A diagnosis is made after a thorough medical  and dental history is taken and the surfaces of your teeth are  checked for signs of dental caries. Sometimes special instruments, such as lasers, are used to check for dental caries. Dental X-ray exams may be taken so that areas not visible to the eye (such as between the contact areas of the teeth) can be checked for cavities.  TREATMENT  If dental caries is in its early stages, it may be reversed with a fluoride treatment or an application of a remineralizing agent at the dental office. Thorough brushing and flossing at home is needed to aid these treatments. If it is in its later stages, treatment depends on the location and extent of tooth destruction:   If a small area of the tooth has been destroyed, the destroyed area will be removed and cavities will be filled with a material such as gold, silver amalgam, or composite resin.   If a large area of the tooth has been destroyed, the destroyed area will be removed and a cap (crown) will be fitted over the remaining tooth structure.   If the center part of the tooth (pulp) is affected, a procedure called a root canal will be needed before a filling or crown can be placed.   If most of the tooth has been destroyed, the tooth may need to be pulled (extracted). HOME CARE INSTRUCTIONS You can prevent, stop, or reverse dental caries at home by practicing good oral hygiene. Good oral hygiene includes:  Thoroughly cleaning your teeth at least twice a day with a toothbrush and dental floss.   Using a fluoride toothpaste. A fluoride mouth rinse may also be used if recommended by your dentist or health care provider.   Restricting the amount of sugary and starchy foods and sugary liquids you consume.   Avoiding frequent snacking on these foods and sipping of these liquids.   Keeping regular visits with a dentist for checkups and cleanings. PREVENTION   Practice good oral hygiene.  Consider a dental sealant. A dental sealant is a coating material that is applied by your dentist to the pits and grooves  of teeth. The sealant prevents food from being trapped in them. It may protect the teeth for several years.  Ask about fluoride supplements if you live in a community without fluorinated water or with water that has a low fluoride content. Use fluoride supplements as directed by your dentist or health care provider.  Allow fluoride varnish applications to teeth if directed by your dentist or health care provider. Document Released: 01/13/2002 Document Revised: 12/24/2012 Document Reviewed: 04/25/2012 [facility] Patient Information 2014 [place].     I personally performed the services described in this documentation, which was scribed in my presence. The recorded information has been reviewed and considered, and addended by me as needed.

## 2013-10-19 ENCOUNTER — Other Ambulatory Visit: Payer: Self-pay | Admitting: Internal Medicine

## 2013-10-20 NOTE — Telephone Encounter (Signed)
Faxed script back to [facility].../lmb 

## 2013-10-26 ENCOUNTER — Other Ambulatory Visit: Payer: Self-pay | Admitting: Internal Medicine

## 2013-10-26 NOTE — Telephone Encounter (Signed)
MD out of office. Refills okay?  Had a physical 08/12/13

## 2013-10-26 NOTE — Telephone Encounter (Signed)
One refill of each

## 2013-11-19 ENCOUNTER — Other Ambulatory Visit: Payer: Self-pay | Admitting: Internal Medicine

## 2013-11-19 NOTE — Telephone Encounter (Signed)
Called Tramadol rx in #60 5 refills

## 2013-11-28 ENCOUNTER — Emergency Department (HOSPITAL_COMMUNITY)
Admission: EM | Admit: 2013-11-28 | Discharge: 2013-11-28 | Disposition: A | Discharge status: Home / Self Care | Payer: 59 | Source: Home / Self Care | Attending: Family Medicine | Admitting: Family Medicine

## 2013-11-28 ENCOUNTER — Encounter (HOSPITAL_COMMUNITY): Payer: Self-pay | Admitting: Emergency Medicine

## 2013-11-28 DIAGNOSIS — A088 Other specified intestinal infections: Secondary | ICD-10-CM

## 2013-11-28 DIAGNOSIS — A084 Viral intestinal infection, unspecified: Secondary | ICD-10-CM

## 2013-11-28 DIAGNOSIS — D179 Benign lipomatous neoplasm, unspecified: Secondary | ICD-10-CM

## 2013-11-28 DIAGNOSIS — I498 Other specified cardiac arrhythmias: Secondary | ICD-10-CM

## 2013-11-28 DIAGNOSIS — R42 Dizziness and giddiness: Secondary | ICD-10-CM

## 2013-11-28 DIAGNOSIS — R001 Bradycardia, unspecified: Secondary | ICD-10-CM

## 2013-11-28 DIAGNOSIS — J45901 Unspecified asthma with (acute) exacerbation: Secondary | ICD-10-CM

## 2013-11-28 DIAGNOSIS — E039 Hypothyroidism, unspecified: Secondary | ICD-10-CM

## 2013-11-28 LAB — POCT I-STAT, CHEM 8
BUN: 13 mg/dL (ref 6–23)
CALCIUM ION: 1.2 mmol/L (ref 1.12–1.23)
Chloride: 105 mEq/L (ref 96–112)
Creatinine, Ser: 1 mg/dL (ref 0.50–1.10)
GLUCOSE: 115 mg/dL — AB (ref 70–99)
HEMATOCRIT: 43 % (ref 36.0–46.0)
HEMOGLOBIN: 14.6 g/dL (ref 12.0–15.0)
Potassium: 4.1 mEq/L (ref 3.7–5.3)
Sodium: 141 mEq/L (ref 137–147)
TCO2: 22 mmol/L (ref 0–100)

## 2013-11-28 LAB — CBC WITH DIFFERENTIAL/PLATELET
Basophils Absolute: 0 10*3/uL (ref 0.0–0.1)
Basophils Relative: 0 % (ref 0–1)
Eosinophils Absolute: 0.3 10*3/uL (ref 0.0–0.7)
Eosinophils Relative: 5 % (ref 0–5)
HCT: 37.3 % (ref 36.0–46.0)
HEMOGLOBIN: 11.9 g/dL — AB (ref 12.0–15.0)
Lymphocytes Relative: 32 % (ref 12–46)
Lymphs Abs: 2.2 10*3/uL (ref 0.7–4.0)
MCH: 26.8 pg (ref 26.0–34.0)
MCHC: 31.9 g/dL (ref 30.0–36.0)
MCV: 84 fL (ref 78.0–100.0)
MONOS PCT: 8 % (ref 3–12)
Monocytes Absolute: 0.5 10*3/uL (ref 0.1–1.0)
NEUTROS PCT: 55 % (ref 43–77)
Neutro Abs: 3.8 10*3/uL (ref 1.7–7.7)
PLATELETS: 140 10*3/uL — AB (ref 150–400)
RBC: 4.44 MIL/uL (ref 3.87–5.11)
RDW: 19.7 % — ABNORMAL HIGH (ref 11.5–15.5)
WBC: 6.8 10*3/uL (ref 4.0–10.5)

## 2013-11-28 LAB — TSH: TSH: 1.17 u[IU]/mL (ref 0.350–4.500)

## 2013-11-28 MED ORDER — SODIUM CHLORIDE 0.9 % IV BOLUS (SEPSIS)
1000.0000 mL | Freq: Once | INTRAVENOUS | Status: AC
  Administered 2013-11-28: 1000 mL via INTRAVENOUS

## 2013-11-28 MED ORDER — METHYLPREDNISOLONE SODIUM SUCC 125 MG IJ SOLR
INTRAMUSCULAR | Status: AC
Start: 2013-11-28 — End: 2013-11-28
  Filled 2013-11-28: qty 2

## 2013-11-28 MED ORDER — ONDANSETRON HCL 4 MG/2ML IJ SOLN
4.0000 mg | Freq: Once | INTRAMUSCULAR | Status: AC
  Administered 2013-11-28: 4 mg via INTRAVENOUS

## 2013-11-28 MED ORDER — IPRATROPIUM-ALBUTEROL 0.5-2.5 (3) MG/3ML IN SOLN
RESPIRATORY_TRACT | Status: AC
  Filled 2013-11-28: qty 3

## 2013-11-28 MED ORDER — ONDANSETRON 4 MG PO TBDP: 4.0000 mg | ORAL_TABLET | Freq: Three times a day (TID) | ORAL | Status: DC | PRN

## 2013-11-28 MED ORDER — ONDANSETRON HCL 4 MG/2ML IJ SOLN
INTRAMUSCULAR | Status: AC
  Filled 2013-11-28: qty 2

## 2013-11-28 MED ORDER — ONDANSETRON HCL 4 MG/2ML IJ SOLN: 4.0000 mg | Freq: Once | INTRAMUSCULAR | Status: DC

## 2013-11-28 MED ORDER — METHYLPREDNISOLONE SODIUM SUCC 125 MG IJ SOLR
125.0000 mg | Freq: Once | INTRAMUSCULAR | Status: AC
  Administered 2013-11-28: 125 mg via INTRAVENOUS

## 2013-11-28 MED ORDER — PREDNISONE 50 MG PO TABS: ORAL_TABLET | ORAL | Status: DC

## 2013-11-28 MED ORDER — IPRATROPIUM-ALBUTEROL 0.5-2.5 (3) MG/3ML IN SOLN
3.0000 mL | Freq: Once | RESPIRATORY_TRACT | Status: AC
  Administered 2013-11-28: 3 mL via RESPIRATORY_TRACT

## 2013-11-28 NOTE — ED Notes (Signed)
C/o dizziness and nausea States she does have a headache,chills  States sx started Wednesday She is not sue if she passed out on Wednesday

## 2013-11-28 NOTE — ED Provider Notes (Signed)
CSN: [id number]     Arrival date & time 11/28/13  1207 History   First MD Initiated Contact with Patient 11/28/13 1250     Chief Complaint  Patient presents with  . Dizziness  . Nausea   (Consider location/radiation/quality/duration/timing/severity/associated sxs/prior Treatment) HPI  Started feeling dizzy and nauseaus for 10 days. Started taking iron supplementation w/o benefit. Associated w/ fatigue, cold intolerance. 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 Tolerating PO but appetite is down. deneis fevers, dysuria, frequency. Takes B12 vitamin.   SOB: Using albuterol at night now due wheezing. Ongoing for 4 wks. Denies runny nose. Occasional cough. URI 5 wks ago.    Past Medical History  Diagnosis Date  . GERD   . HYPERTENSION   . ALLERGIC RHINITIS   . ANEMIA-NOS   . ANXIETY   . ASTHMA   . HYPOTHYROIDISM   . HYPERGLYCEMIA, BORDERLINE   . Personal history of colonic polyps 2011    last colonscopy 2011: no polyps.   Past Surgical History  Procedure Laterality Date  . Myomectomy  1997  . Breast surgery  2004    Lumpectomy, right side-Benign  . Thyroidectomy, partial  2004    Left, benign  . Vein surgery  1997    left arm  . Foot surgery  2006    right  . Hernia repair  1995    [facility] ?[place]   Family History  Problem Relation Age of Onset  . Hypertension Mother   . Osteoarthritis Mother   . Diabetes Sister   . Arthritis Other   . Pancreatic cancer Other   . Diabetes Other    History  Substance Use Topics  . Smoking status: Current Every Day Smoker -- 0.40 packs/day for 2 years    Types: Cigarettes  . Smokeless tobacco: Not on file     Comment: Married 2009-lives with spouse, no kids  . Alcohol Use: No   OB History   Grav Para Term Preterm Abortions TAB SAB Ect Mult Living                 Review of Systems  Allergies  Naproxen  Home Medications   Prior to Admission medications   Medication Sig Start Date End  Date Taking? Authorizing Provider  ALPRAZolam (XANAX) 0.5 MG tablet TAKE 1 TABLET BY MOUTH 3 TIMES DAILY AS NEEDED FOR SLEEP OR ANXIETY 10/19/13   Rowe Clack, MD  amoxicillin (AMOXIL) 500 MG capsule Take 1 capsule (500 mg total) by mouth 3 (three) times daily. 09/26/13   [name], MD  beclomethasone (QVAR) 40 MCG/ACT inhaler Inhale 2 puffs into the lungs 2 (two) times daily. 09/11/12   Rowe Clack, MD  benzonatate (TESSALON) 200 MG capsule Take 1 capsule (200 mg total) by mouth 3 (three) times daily as needed for cough. 03/16/13   Rowe Clack, MD  busPIRone (BUSPAR) 10 MG tablet Take 2 tablets (20 mg total) by mouth 2 (two) times daily. 11/06/12 11/06/13  Rowe Clack, MD  cetirizine (ZYRTEC) 10 MG tablet Take 1 tablet (10 mg total) by mouth daily. 09/11/12   Rowe Clack, MD  citalopram (CELEXA) 40 MG tablet TAKE 1 TABLET BY MOUTH ONCE DAILY 10/26/13   Hendricks Limes, MD  diclofenac sodium (VOLTAREN) 1 % GEL Apply 2 g topically 3 (three) times daily. Apply to painful area on right foot three times a day as needed 07/28/12   Rowe Clack, MD  HYDROcodone-acetaminophen (NORCO/VICODIN) 5-325 MG per tablet Take 1 tablet by mouth every 6 (six) hours as needed for moderate pain. 09/26/13   [name], MD  lisinopril-hydrochlorothiazide (PRINZIDE,ZESTORETIC) 10-12.5 MG per tablet TAKE 1 TABLET BY MOUTH DAILY. 09/25/13   Rowe Clack, MD  traMADol (ULTRAM) 50 MG tablet TAKE 1 TABLET BY MOUTH EVERY 6 HOURS AS NEEDED FOR PAIN 11/19/13   Rowe Clack, MD  traZODone (DESYREL) 50 MG tablet TAKE 1 TABLET BY MOUTH AT BEDTIME. 10/26/13   Hendricks Limes, MD  VENTOLIN HFA 108 (90 BASE) MCG/ACT inhaler INHALE 2 PUFFS BY MOUTH 4 TIMES DAILY AS NEEDED FOR SHORTNESS OF BREATH 09/25/13   Rowe Clack, MD   BP 117/58  Pulse 58  Temp(Src) 98.5 F (36.9 C) (Oral)  Resp 16  SpO2 100% Physical Exam  Constitutional: She is oriented to person, place, and time. She  appears well-developed and well-nourished. No distress.  HENT:  Head: Normocephalic and atraumatic.  Dry mm  Eyes: EOM are normal. Pupils are equal, round, and reactive to light.  Neck: Normal range of motion. Neck supple. No thyromegaly present.  Cardiovascular: Normal rate, regular rhythm, normal heart sounds and intact distal pulses.  Exam reveals no gallop and no friction rub.   No murmur heard. Pulmonary/Chest: Effort normal.  Mild end expiraotory wheezing L>R  Abdominal: Soft. Bowel sounds are normal. She exhibits no distension and no mass. There is no tenderness. There is no rebound and no guarding.  Superficial well circumscribed 3x4cm soft tissue mass of the L flank. No suroundign erythema or induration   Musculoskeletal: Normal range of motion. She exhibits no edema and no tenderness.  Neurological: She is alert and oriented to person, place, and time. She exhibits normal muscle tone.  Skin: Skin is warm. No rash noted. She is not diaphoretic.  Psychiatric: She has a normal mood and affect. Her behavior is normal. Judgment and thought content normal.    ED Course  Procedures (including critical care time) Labs Review Labs Reviewed  POCT I-STAT, CHEM 8 - Abnormal; Notable for the following:    Glucose, Bld 115 (*)    All other components within normal limits  TSH  CBC WITH DIFFERENTIAL    Imaging Review No results found.   MDM   1. Viral gastroenteritis   2. Hypothyroidism, unspecified hypothyroidism type   3. Lipoma   4. Bradycardia   5. Orthostatic dizziness    Generalized ill feeling w/ nausea and dizziness likely from viral gastro and dehydration. Much improved after 1L NS bolus and zofran. 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 Chem 8 unremarkable. EKG w/ sinus brady but pt w/ nml to low pulse at baseline in the past. Orthostatics + initially. May also be related to hypothyroidism as has not taken any medications for thyroid in some time. WBC nml.   Condition much improved after zofran and 1L NS. OK for DC  Anemia: slightly low on CBC.   Lipoma: discussed benign features of lipoma and reasons for evaluation and tratment. If continues to be painful. Pt to discuss surgical referral w/ pcp  Ashtma exacerbation: mild. Solumedrol 125 adn duoneb. Feeling improved overall. Albuterol Q4 x 24-48 hrs. Prednisone 50mg  x 5 days.    Linna Darner, MD Family Medicine 11/28/2013, 1:20 PM      Waldemar Dickens, MD 11/28/13 [phone]

## 2013-11-28 NOTE — Discharge Instructions (Signed)
The cause of your dizziness and fatigue is all likely from a viral gut infection. This could take another 1-7 days to clear.  Please stay well hydrated adn get lots of rest Please use the zofran as needed for nausea Please follow up with your regular doctor regarding yoru thyroid results as this may be part of the reason you are feeling so poorly Please take th prednisone daily for 5 days to help with the asthma Please use your inhaler every 4 hours for the next 1-2 days.

## 2013-12-03 ENCOUNTER — Other Ambulatory Visit: Payer: Self-pay | Admitting: Internal Medicine

## 2013-12-04 ENCOUNTER — Other Ambulatory Visit: Payer: Self-pay | Admitting: *Deleted

## 2013-12-04 MED ORDER — TRAZODONE HCL 50 MG PO TABS: ORAL_TABLET | ORAL | Status: DC

## 2013-12-08 ENCOUNTER — Other Ambulatory Visit: Payer: Self-pay

## 2013-12-08 MED ORDER — ALPRAZOLAM 0.5 MG PO TABS: ORAL_TABLET | ORAL | Status: DC

## 2013-12-10 ENCOUNTER — Telehealth: Payer: Self-pay | Admitting: Internal Medicine

## 2013-12-10 NOTE — Telephone Encounter (Signed)
Pt call stated that urgent care ran a test 11/28/13 about her BP is dropping and they going to send our office the result. Pt was wondering what the result were and what Dr. Asa Lente wants to do about this. appt is set for 01/28/14. Please call pt

## 2013-12-14 ENCOUNTER — Other Ambulatory Visit: Payer: Self-pay | Admitting: Internal Medicine

## 2013-12-21 ENCOUNTER — Other Ambulatory Visit: Payer: Self-pay

## 2013-12-21 MED ORDER — CITALOPRAM HYDROBROMIDE 40 MG PO TABS: ORAL_TABLET | ORAL | Status: DC

## 2013-12-21 NOTE — Telephone Encounter (Signed)
erx sent for Celexa

## 2014-01-12 ENCOUNTER — Other Ambulatory Visit: Payer: Self-pay | Admitting: Internal Medicine

## 2014-01-28 ENCOUNTER — Ambulatory Visit: Payer: Self-pay | Admitting: Internal Medicine

## 2014-01-28 DIAGNOSIS — Z0289 Encounter for other administrative examinations: Secondary | ICD-10-CM

## 2014-03-25 ENCOUNTER — Other Ambulatory Visit: Payer: Self-pay | Admitting: Internal Medicine

## 2014-03-25 ENCOUNTER — Other Ambulatory Visit: Payer: Self-pay

## 2014-03-25 MED ORDER — LISINOPRIL-HYDROCHLOROTHIAZIDE 10-12.5 MG PO TABS: 1.0000 | ORAL_TABLET | Freq: Every day | ORAL | Status: DC

## 2014-03-25 MED ORDER — TRAMADOL HCL 50 MG PO TABS: 50.0000 mg | ORAL_TABLET | Freq: Four times a day (QID) | ORAL | Status: DC | PRN

## 2014-03-25 MED ORDER — ALBUTEROL SULFATE HFA 108 (90 BASE) MCG/ACT IN AERS: INHALATION_SPRAY | RESPIRATORY_TRACT | Status: DC

## 2014-03-30 ENCOUNTER — Telehealth: Payer: Self-pay | Admitting: Family

## 2014-03-30 ENCOUNTER — Other Ambulatory Visit (INDEPENDENT_AMBULATORY_CARE_PROVIDER_SITE_OTHER): Payer: 59

## 2014-03-30 ENCOUNTER — Encounter: Payer: Self-pay | Admitting: Family

## 2014-03-30 ENCOUNTER — Ambulatory Visit (INDEPENDENT_AMBULATORY_CARE_PROVIDER_SITE_OTHER): Payer: 59 | Admitting: Family

## 2014-03-30 VITALS — BP 144/102 | HR 70 | Temp 98.3°F | Resp 18 | Ht 62.0 in | Wt 239.8 lb

## 2014-03-30 DIAGNOSIS — I10 Essential (primary) hypertension: Secondary | ICD-10-CM

## 2014-03-30 DIAGNOSIS — K088 Other specified disorders of teeth and supporting structures: Secondary | ICD-10-CM

## 2014-03-30 DIAGNOSIS — M1990 Unspecified osteoarthritis, unspecified site: Secondary | ICD-10-CM

## 2014-03-30 DIAGNOSIS — K0889 Other specified disorders of teeth and supporting structures: Secondary | ICD-10-CM

## 2014-03-30 DIAGNOSIS — J4530 Mild persistent asthma, uncomplicated: Secondary | ICD-10-CM

## 2014-03-30 LAB — BASIC METABOLIC PANEL
BUN: 14 mg/dL (ref 6–23)
CO2: 28 mEq/L (ref 19–32)
Calcium: 9.1 mg/dL (ref 8.4–10.5)
Chloride: 104 mEq/L (ref 96–112)
Creatinine, Ser: 1 mg/dL (ref 0.4–1.2)
GFR: 72.7 mL/min (ref >60.00)
Glucose, Bld: 111 mg/dL — ABNORMAL HIGH (ref 70–99)
POTASSIUM: 4.5 meq/L (ref 3.5–5.1)
Sodium: 139 mEq/L (ref 135–145)

## 2014-03-30 LAB — TSH: TSH: 0.95 u[IU]/mL (ref 0.35–4.50)

## 2014-03-30 MED ORDER — HYDROCODONE-ACETAMINOPHEN 5-325 MG PO TABS: 1.0000 | ORAL_TABLET | Freq: Four times a day (QID) | ORAL | Status: DC | PRN

## 2014-03-30 MED ORDER — BECLOMETHASONE DIPROPIONATE 40 MCG/ACT IN AERS: 2.0000 | INHALATION_SPRAY | Freq: Two times a day (BID) | RESPIRATORY_TRACT | Status: DC

## 2014-03-30 NOTE — Progress Notes (Signed)
Subjective:    Patient ID: Wendy Carr, female    DOB: 03-12-1956, 58 y.o.   MRN: [id number]  Chief Complaint  Patient presents with  . Follow-up    Hypertension, asthma, arthritis and tooth pain   HPI:  Wendy Carr is a 58 y.o. female who presents today for medication follow up.  1) Hypertension - Currently maintained on linisinorpil-HCTZ. Denies cough related to medication, chest pain/discomfort, palpitations, or edema. Last eye exam is up to date.  BP Readings from Last 3 Encounters:  03/30/14 144/102  11/28/13 117/58  09/26/13 124/70   2) Arthritis - Currently maintained on Tramadol and voltaren gel. Tramadol by itself does not work. Has also been using bengay and other OTC medications.   3) Asthma - Currently maintained with the albuterol and QVAR. On average having to use the albuterol inhaler 1-2 times per day. Has not been taking the QVAR on a regular basis.   4) Tooth pain - has been going on for about 3-4 weeks and has an appointment with dentistry on 11/30.    Allergies  Allergen Reactions  . Naproxen Other (See Comments)    Stomach cramps    Current Outpatient Prescriptions on File Prior to Visit  Medication Sig Dispense Refill  . albuterol (VENTOLIN HFA) 108 (90 BASE) MCG/ACT inhaler INHALE 2 PUFFS BY MOUTH 4 TIMES DAILY AS NEEDED FOR SHORTNESS OF BREATH 18 g 2  . ALPRAZolam (XANAX) 0.5 MG tablet TAKE 1 TABLET BY MOUTH 3 TIMES DAILY AS NEEDED FOR SLEEP OR ANXIETY 60 tablet 5  . amoxicillin (AMOXIL) 500 MG capsule Take 1 capsule (500 mg total) by mouth 3 (three) times daily. 30 capsule 0  . benzonatate (TESSALON) 200 MG capsule Take 1 capsule (200 mg total) by mouth 3 (three) times daily as needed for cough. 30 capsule 1  . busPIRone (BUSPAR) 10 MG tablet TAKE 2 TABLETS BY MOUTH TWICE DAILY 120 tablet 10  . cetirizine (ZYRTEC) 10 MG tablet Take 1 tablet (10 mg total) by mouth daily. 30 tablet 11  . citalopram (CELEXA) 40 MG tablet TAKE 1 TABLET BY MOUTH  ONCE DAILY 30 tablet 5  . diclofenac sodium (VOLTAREN) 1 % GEL Apply 2 g topically 3 (three) times daily. Apply to painful area on right foot three times a day as needed 100 g 1  . lisinopril-hydrochlorothiazide (PRINZIDE,ZESTORETIC) 10-12.5 MG per tablet Take 1 tablet by mouth daily. 30 tablet 5  . ondansetron (ZOFRAN-ODT) 4 MG disintegrating tablet Take 1 tablet (4 mg total) by mouth every 8 (eight) hours as needed for nausea or vomiting. 20 tablet 0  . predniSONE (DELTASONE) 50 MG tablet Take daily with breakfast 5 tablet 0  . traMADol (ULTRAM) 50 MG tablet Take 1 tablet (50 mg total) by mouth every 6 (six) hours as needed. for pain 60 tablet PRN  . traZODone (DESYREL) 50 MG tablet TAKE 1 TABLET BY MOUTH AT BEDTIME. 30 tablet 3   No current facility-administered medications on file prior to visit.   Past Medical History  Diagnosis Date  . GERD   . HYPERTENSION   . ALLERGIC RHINITIS   . ANEMIA-NOS   . ANXIETY   . ASTHMA   . HYPOTHYROIDISM   . HYPERGLYCEMIA, BORDERLINE   . Personal history of colonic polyps 2011    last colonscopy 2011: no polyps.   Review of Systems    See HPI  Objective:    BP 144/102 mmHg  Pulse 70  Temp(Src) 98.3 F (36.8 C) (  Oral)  Resp 18  Ht 5\' 2"  (1.575 m)  Wt 239 lb 12.8 oz (108.773 kg)  BMI 43.85 kg/m2  SpO2 98% Nursing note and vital signs reviewed.  Physical Exam  Constitutional: She is oriented to person, place, and time. No distress.  Obese female seated in the chair, dressed appropriately, appears her stated age.  Cardiovascular: Normal rate, regular rhythm, normal heart sounds and intact distal pulses.   Pulmonary/Chest: Effort normal and breath sounds normal.  Neurological: She is alert and oriented to person, place, and time.  Skin: Skin is warm and dry.  Psychiatric: She has a normal mood and affect. Her behavior is normal. Judgment and thought content normal.      Assessment & Plan:

## 2014-03-30 NOTE — Assessment & Plan Note (Signed)
Currently waxing and waning. Inconsistent medicine use with the QVAR. Discussed using QVAR 2 puffs BID daily to assist with reduction in albuterol use. Pt in agreement with the plan. Continue albuterol at current dose and QVAR. If no improvements with consistent QVAR use start inhaled steroid with LABA. Follow up in about 3 months.

## 2014-03-30 NOTE — Patient Instructions (Signed)
Thank you for choosing [facility].  Summary/Instructions:  Your prescription(s) have been submitted to your pharmacy. Please take as directed and contact our office if you believe you are having problem(s) with the medication(s).  Please stop by the lab on the basement level of the building for your blood work. Your results will be released to [place] (or called to you) after review, usually within 72hours after test completion. If any changes need to be made, you will be notified at that same time.  If your symptoms worsen or fail to improve, please contact our office for further instruction, or in case of emergency go directly to the emergency room at the closest medical facility.    Asthma Asthma is a recurring condition in which the airways tighten and narrow. Asthma can make it difficult to breathe. It can cause coughing, wheezing, and shortness of breath. Asthma episodes, also called asthma attacks, range from minor to life-threatening. Asthma cannot be cured, but medicines and lifestyle changes can help control it. CAUSES Asthma is believed to be caused by inherited (genetic) and environmental factors, but its exact cause is unknown. Asthma may be triggered by allergens, lung infections, or irritants in the air. Asthma triggers are different for each person. Common triggers include:   Animal dander.  Dust mites.  Cockroaches.  Pollen from trees or grass.  Mold.  Smoke.  Air pollutants such as dust, household cleaners, hair sprays, aerosol sprays, paint fumes, strong chemicals, or strong odors.  Cold air, weather changes, and winds (which increase molds and pollens in the air).  Strong emotional expressions such as crying or laughing hard.  Stress.  Certain medicines (such as aspirin) or types of drugs (such as beta-blockers).  Sulfites in foods and drinks. Foods and drinks that may contain sulfites include dried fruit, potato chips, and sparkling grape  juice.  Infections or inflammatory conditions such as the flu, a cold, or an inflammation of the nasal membranes (rhinitis).  Gastroesophageal reflux disease (GERD).  Exercise or strenuous activity. SYMPTOMS Symptoms may occur immediately after asthma is triggered or many hours later. Symptoms include:  Wheezing.  Excessive nighttime or early morning coughing.  Frequent or severe coughing with a common cold.  Chest tightness.  Shortness of breath. DIAGNOSIS  The diagnosis of asthma is made by a review of your medical history and a physical exam. Tests may also be performed. These may include:  Lung function studies. These tests show how much air you breathe in and out.  Allergy tests.  Imaging tests such as X-rays. TREATMENT  Asthma cannot be cured, but it can usually be controlled. Treatment involves identifying and avoiding your asthma triggers. It also involves medicines. There are 2 classes of medicine used for asthma treatment:   Controller medicines. These prevent asthma symptoms from occurring. They are usually taken every day.  Reliever or rescue medicines. These quickly relieve asthma symptoms. They are used as needed and provide short-term relief. Your health care provider will help you create an asthma action plan. An asthma action plan is a written plan for managing and treating your asthma attacks. It includes a list of your asthma triggers and how they may be avoided. It also includes information on when medicines should be taken and when their dosage should be changed. An action plan may also involve the use of a device called a peak flow meter. A peak flow meter measures how well the lungs are working. It helps you monitor your condition. HOME CARE INSTRUCTIONS  Take medicines only as directed by your health care provider. Speak with your health care provider if you have questions about how or when to take the medicines.  Use a peak flow meter as directed by  your health care provider. Record and keep track of readings.  Understand and use the action plan to help minimize or stop an asthma attack without needing to seek medical care.  Control your home environment in the following ways to help prevent asthma attacks:  Do not smoke. Avoid being exposed to secondhand smoke.  Change your heating and air conditioning filter regularly.  Limit your use of fireplaces and wood stoves.  Get rid of pests (such as roaches and mice) and their droppings.  Throw away plants if you see mold on them.  Clean your floors and dust regularly. Use unscented cleaning products.  Try to have someone else vacuum for you regularly. Stay out of rooms while they are being vacuumed and for a short while afterward. If you vacuum, use a dust mask from a hardware store, a double-layered or microfilter vacuum cleaner bag, or a vacuum cleaner with a HEPA filter.  Replace carpet with wood, tile, or vinyl flooring. Carpet can trap dander and dust.  Use allergy-proof pillows, mattress covers, and box spring covers.  Wash bed sheets and blankets every week in hot water and dry them in a dryer.  Use blankets that are made of polyester or cotton.  Clean bathrooms and kitchens with bleach. If possible, have someone repaint the walls in these rooms with mold-resistant paint. Keep out of the rooms that are being cleaned and painted.  Wash hands frequently. SEEK MEDICAL CARE IF:   You have wheezing, shortness of breath, or a cough even if taking medicine to prevent attacks.  The colored mucus you cough up (sputum) is thicker than usual.  Your sputum changes from clear or white to yellow, green, gray, or bloody.  You have any problems that may be related to the medicines you are taking (such as a rash, itching, swelling, or trouble breathing).  You are using a reliever medicine more than 2-3 times per week.  Your peak flow is still at 50-79% of your personal best after  following your action plan for 1 hour.  You have a fever. SEEK IMMEDIATE MEDICAL CARE IF:   You seem to be getting worse and are unresponsive to treatment during an asthma attack.  You are short of breath even at rest.  You get short of breath when doing very little physical activity.  You have difficulty eating, drinking, or talking due to asthma symptoms.  You develop chest pain.  You develop a fast heartbeat.  You have a bluish color to your lips or fingernails.  You are light-headed, dizzy, or faint.  Your peak flow is less than 50% of your personal best. MAKE SURE YOU:   Understand these instructions.  Will watch your condition.  Will get help right away if you are not doing well or get worse. Document Released: 04/23/2005 Document Revised: 09/07/2013 Document Reviewed: 11/20/2012 [facility] Patient Information 2015 [place], [place]. This information is not intended to replace advice given to you by your health care provider. Make sure you discuss any questions you have with your health care provider.

## 2014-03-30 NOTE — Telephone Encounter (Signed)
Left pt a message to call me back

## 2014-03-30 NOTE — Assessment & Plan Note (Signed)
Stable. Continue current Tramadol as needed and voltaren gel. Discussed continued exercise in reducing symptoms.

## 2014-03-30 NOTE — Assessment & Plan Note (Signed)
Stable. Blood pressure increased today secondary to increased dental pain. Continue lisinopril-HCTZ at current dose. Obtain BMET and TSH.

## 2014-03-30 NOTE — Assessment & Plan Note (Signed)
Has appointment with dentistry on 11/30 and having excessive pain. Start hydrocodone-acetaminophen as needed for pain. No refills of medication will be given.

## 2014-03-30 NOTE — Telephone Encounter (Signed)
Please call patient and inform her that the lab work done this morning is within the normal expected ranges.

## 2014-03-30 NOTE — Progress Notes (Signed)
Pre visit review using our clinic review tool, if applicable. No additional management support is needed unless otherwise documented below in the visit note. 

## 2014-04-02 NOTE — Telephone Encounter (Signed)
Called pt and gave lab results.

## 2014-04-19 ENCOUNTER — Telehealth: Payer: Self-pay | Admitting: Internal Medicine

## 2014-04-21 NOTE — Telephone Encounter (Signed)
rx printed for md to sign 

## 2014-04-21 NOTE — Telephone Encounter (Signed)
Pt requesting this be filled, request from [facility]

## 2014-04-21 NOTE — Telephone Encounter (Signed)
rx has been faxed 

## 2014-05-31 ENCOUNTER — Ambulatory Visit (INDEPENDENT_AMBULATORY_CARE_PROVIDER_SITE_OTHER): Payer: 59

## 2014-05-31 ENCOUNTER — Ambulatory Visit (INDEPENDENT_AMBULATORY_CARE_PROVIDER_SITE_OTHER): Payer: 59 | Admitting: Family Medicine

## 2014-05-31 VITALS — BP 148/78 | HR 68 | Temp 98.3°F | Resp 18 | Wt 236.0 lb

## 2014-05-31 DIAGNOSIS — I10 Essential (primary) hypertension: Secondary | ICD-10-CM

## 2014-05-31 DIAGNOSIS — R059 Cough, unspecified: Secondary | ICD-10-CM

## 2014-05-31 DIAGNOSIS — R062 Wheezing: Secondary | ICD-10-CM

## 2014-05-31 DIAGNOSIS — J209 Acute bronchitis, unspecified: Secondary | ICD-10-CM

## 2014-05-31 DIAGNOSIS — Z720 Tobacco use: Secondary | ICD-10-CM

## 2014-05-31 DIAGNOSIS — J4531 Mild persistent asthma with (acute) exacerbation: Secondary | ICD-10-CM

## 2014-05-31 DIAGNOSIS — R05 Cough: Secondary | ICD-10-CM

## 2014-05-31 DIAGNOSIS — F17200 Nicotine dependence, unspecified, uncomplicated: Secondary | ICD-10-CM

## 2014-05-31 MED ORDER — IPRATROPIUM BROMIDE 0.02 % IN SOLN
0.5000 mg | Freq: Once | RESPIRATORY_TRACT | Status: AC
Start: 2014-05-31 — End: 2014-05-31
  Administered 2014-05-31: 0.5 mg via RESPIRATORY_TRACT

## 2014-05-31 MED ORDER — ALBUTEROL SULFATE (2.5 MG/3ML) 0.083% IN NEBU
2.5000 mg | INHALATION_SOLUTION | Freq: Once | RESPIRATORY_TRACT | Status: AC
  Administered 2014-05-31: 2.5 mg via RESPIRATORY_TRACT

## 2014-05-31 MED ORDER — IPRATROPIUM-ALBUTEROL 0.5-2.5 (3) MG/3ML IN SOLN: 3.0000 mL | Freq: Four times a day (QID) | RESPIRATORY_TRACT | Status: DC | PRN

## 2014-05-31 MED ORDER — BECLOMETHASONE DIPROPIONATE 40 MCG/ACT IN AERS: 2.0000 | INHALATION_SPRAY | Freq: Two times a day (BID) | RESPIRATORY_TRACT | Status: DC

## 2014-05-31 MED ORDER — BENZONATATE 100 MG PO CAPS: 200.0000 mg | ORAL_CAPSULE | Freq: Two times a day (BID) | ORAL | Status: DC | PRN

## 2014-05-31 MED ORDER — METHYLPREDNISOLONE ACETATE 80 MG/ML IJ SUSP
80.0000 mg | Freq: Once | INTRAMUSCULAR | Status: AC
  Administered 2014-05-31: 80 mg via INTRAMUSCULAR

## 2014-05-31 MED ORDER — ALBUTEROL SULFATE HFA 108 (90 BASE) MCG/ACT IN AERS: INHALATION_SPRAY | RESPIRATORY_TRACT | Status: DC

## 2014-05-31 MED ORDER — AZITHROMYCIN 250 MG PO TABS: ORAL_TABLET | ORAL | Status: DC

## 2014-05-31 MED ORDER — IPRATROPIUM BROMIDE 0.02 % IN SOLN
0.5000 mg | Freq: Once | RESPIRATORY_TRACT | Status: AC
  Administered 2014-05-31: 0.5 mg via RESPIRATORY_TRACT

## 2014-05-31 MED ORDER — HYDROCOD POLST-CHLORPHEN POLST 10-8 MG/5ML PO LQCR
5.0000 mL | Freq: Two times a day (BID) | ORAL | Status: DC | PRN
Start: 2014-05-31 — End: 2014-06-15

## 2014-05-31 NOTE — Patient Instructions (Signed)
Asthma, Acute Bronchospasm Acute bronchospasm caused by asthma is also referred to as an asthma attack. Bronchospasm means your air passages become narrowed. The narrowing is caused by inflammation and tightening of the muscles in the air tubes (bronchi) in your lungs. This can make it hard to breathe or cause you to wheeze and cough. CAUSES Possible triggers are:  Animal dander from the skin, hair, or feathers of animals.  Dust mites contained in house dust.  Cockroaches.  Pollen from trees or grass.  Mold.  Cigarette or tobacco smoke.  Air pollutants such as dust, household cleaners, hair sprays, aerosol sprays, paint fumes, strong chemicals, or strong odors.  Cold air or weather changes. Cold air may trigger inflammation. Winds increase molds and pollens in the air.  Strong emotions such as crying or laughing hard.  Stress.  Certain medicines such as aspirin or beta-blockers.  Sulfites in foods and drinks, such as dried fruits and wine.  Infections or inflammatory conditions, such as a flu, cold, or inflammation of the nasal membranes (rhinitis).  Gastroesophageal reflux disease (GERD). GERD is a condition where stomach acid backs up into your esophagus.  Exercise or strenuous activity. SIGNS AND SYMPTOMS   Wheezing.  Excessive coughing, particularly at night.  Chest tightness.  Shortness of breath. DIAGNOSIS  Your health care provider will ask you about your medical history and perform a physical exam. A chest X-ray or blood testing may be performed to look for other causes of your symptoms or other conditions that may have triggered your asthma attack. TREATMENT  Treatment is aimed at reducing inflammation and opening up the airways in your lungs. Most asthma attacks are treated with inhaled medicines. These include quick relief or rescue medicines (such as bronchodilators) and controller medicines (such as inhaled corticosteroids). These medicines are sometimes  given through an inhaler or a nebulizer. Systemic steroid medicine taken by mouth or given through an IV tube also can be used to reduce the inflammation when an attack is moderate or severe. Antibiotic medicines are only used if a bacterial infection is present.  HOME CARE INSTRUCTIONS   Rest.  Drink plenty of liquids. This helps the mucus to remain thin and be easily coughed up. Only use caffeine in moderation and do not use alcohol until you have recovered from your illness.  Do not smoke. Avoid being exposed to secondhand smoke.  You play a critical role in keeping yourself in good health. Avoid exposure to things that cause you to wheeze or to have breathing problems.  Keep your medicines up-to-date and available. Carefully follow your health care provider's treatment plan.  Take your medicine exactly as prescribed.  When pollen or pollution is bad, keep windows closed and use an air conditioner or go to places with air conditioning.  Asthma requires careful medical care. See your health care provider for a follow-up as advised. If you are more than [redacted] weeks pregnant and you were prescribed any new medicines, let your obstetrician know about the visit and how you are doing. Follow up with your health care provider as directed.  After you have recovered from your asthma attack, make an appointment with your outpatient doctor to talk about ways to reduce the likelihood of future attacks. If you do not have a doctor who manages your asthma, make an appointment with a primary care doctor to discuss your asthma. SEEK IMMEDIATE MEDICAL CARE IF:   You are getting worse.  You have trouble breathing. If severe, call your local   emergency services (911 in the [place]  You develop chest pain or discomfort.  You are vomiting.  You are not able to keep fluids down.  You are coughing up yellow, green, brown, or bloody sputum.  You have a fever and your symptoms suddenly get worse.  You have  trouble swallowing. MAKE SURE YOU:   Understand these instructions.  Will watch your condition.  Will get help right away if you are not doing well or get worse. Document Released: 08/08/2006 Document Revised: 04/28/2013 Document Reviewed: 10/29/2012 [facility] Patient Information 2015 [place], [place]. This information is not intended to replace advice given to you by your health care provider. Make sure you discuss any questions you have with your health care provider. Acute Bronchitis Bronchitis is inflammation of the airways that extend from the windpipe into the lungs (bronchi). The inflammation often causes mucus to develop. This leads to a cough, which is the most common symptom of bronchitis.  In acute bronchitis, the condition usually develops suddenly and goes away over time, usually in a couple weeks. Smoking, allergies, and asthma can make bronchitis worse. Repeated episodes of bronchitis may cause further lung problems.  CAUSES Acute bronchitis is most often caused by the same virus that causes a cold. The virus can spread from person to person (contagious) through coughing, sneezing, and touching contaminated objects. SIGNS AND SYMPTOMS   Cough.   Fever.   Coughing up mucus.   Body aches.   Chest congestion.   Chills.   Shortness of breath.   Sore throat.  DIAGNOSIS  Acute bronchitis is usually diagnosed through a physical exam. Your health care provider will also ask you questions about your medical history. Tests, such as chest X-rays, are sometimes done to rule out other conditions.  TREATMENT  Acute bronchitis usually goes away in a couple weeks. Oftentimes, no medical treatment is necessary. Medicines are sometimes given for relief of fever or cough. Antibiotic medicines are usually not needed but may be prescribed in certain situations. In some cases, an inhaler may be recommended to help reduce shortness of breath and control the cough. A cool mist  vaporizer may also be used to help thin bronchial secretions and make it easier to clear the chest.  HOME CARE INSTRUCTIONS  Get plenty of rest.   Drink enough fluids to keep your urine clear or pale yellow (unless you have a medical condition that requires fluid restriction). Increasing fluids may help thin your respiratory secretions (sputum) and reduce chest congestion, and it will prevent dehydration.   Take medicines only as directed by your health care provider.  If you were prescribed an antibiotic medicine, finish it all even if you start to feel better.  Avoid smoking and secondhand smoke. Exposure to cigarette smoke or irritating chemicals will make bronchitis worse. If you are a smoker, consider using nicotine gum or skin patches to help control withdrawal symptoms. Quitting smoking will help your lungs heal faster.   Reduce the chances of another bout of acute bronchitis by washing your hands frequently, avoiding people with cold symptoms, and trying not to touch your hands to your mouth, nose, or eyes.   Keep all follow-up visits as directed by your health care provider.  SEEK MEDICAL CARE IF: Your symptoms do not improve after 1 week of treatment.  SEEK IMMEDIATE MEDICAL CARE IF:  You develop an increased fever or chills.   You have chest pain.   You have severe shortness of breath.  You have bloody  sputum.   You develop dehydration.  You faint or repeatedly feel like you are going to pass out.  You develop repeated vomiting.  You develop a severe headache. MAKE SURE YOU:   Understand these instructions.  Will watch your condition.  Will get help right away if you are not doing well or get worse. Document Released: 05/31/2004 Document Revised: 09/07/2013 Document Reviewed: 10/14/2012 [facility] Patient Information 2015 [place], [place]. This information is not intended to replace advice given to you by your health care provider. Make sure you discuss any  questions you have with your health care provider.

## 2014-05-31 NOTE — Progress Notes (Signed)
 Chief Complaint:  Chief Complaint  Patient presents with  . Asthma  . Cough    HPI: Wendy Carr is a 59 y.o. female who is here for  1 week history  Of asthma sxs and coughing, clear productiv sputum She works in a day care center, new daycare Had some diarrhea, no rashes Sore throat from coughing She has been in daycare for 34 years, has asthma  UTD on vaccines  Past Medical History  Diagnosis Date  . GERD   . HYPERTENSION   . ALLERGIC RHINITIS   . ANEMIA-NOS   . ANXIETY   . ASTHMA   . HYPOTHYROIDISM   . HYPERGLYCEMIA, BORDERLINE   . Personal history of colonic polyps 2011    last colonscopy 2011: no polyps.   Past Surgical History  Procedure Laterality Date  . Myomectomy  1997  . Breast surgery  2004    Lumpectomy, right side-Benign  . Thyroidectomy, partial  2004    Left, benign  . Vein surgery  1997    left arm  . Foot surgery  2006    right  . Hernia repair  1995    [facility]   History   Social History  . Marital Status: Married    Spouse Name: N/A    Number of Children: N/A  . Years of Education: N/A   Social History Main Topics  . Smoking status: Current Every Day Smoker -- 0.40 packs/day for 2 years    Types: Cigarettes  . Smokeless tobacco: None     Comment: Married 2009-lives with spouse, no kids  . Alcohol Use: No  . Drug Use: Yes    Special: Cocaine     Comment: [place]  . Sexual Activity: No   Other Topics Concern  . None   Social History Narrative   Family History  Problem Relation Age of Onset  . Hypertension Mother   . Osteoarthritis Mother   . Diabetes Sister   . Arthritis Other   . Pancreatic cancer Other   . Diabetes Other    Allergies  Allergen Reactions  . Naproxen Other (See Comments)    Stomach cramps    Prior to Admission medications   Medication Sig Start Date End Date Taking? Authorizing Provider  albuterol (VENTOLIN HFA) 108 (90 BASE) MCG/ACT inhaler INHALE 2 PUFFS BY MOUTH 4 TIMES DAILY  AS NEEDED FOR SHORTNESS OF BREATH 03/25/14  Yes Rowe Clack, MD  ALPRAZolam Duanne Moron) 0.5 MG tablet Take 1 tablet (0.5 mg total) by mouth 3 (three) times daily as needed for anxiety or sleep. 04/21/14  Yes Rowe Clack, MD  beclomethasone (QVAR) 40 MCG/ACT inhaler Inhale 2 puffs into the lungs 2 (two) times daily. 03/30/14  Yes Mauricio Po, FNP  busPIRone (BUSPAR) 10 MG tablet TAKE 2 TABLETS BY MOUTH TWICE DAILY 01/12/14  Yes Rowe Clack, MD  citalopram (CELEXA) 40 MG tablet TAKE 1 TABLET BY MOUTH ONCE DAILY 12/21/13  Yes Rowe Clack, MD  lisinopril-hydrochlorothiazide (PRINZIDE,ZESTORETIC) 10-12.5 MG per tablet Take 1 tablet by mouth daily. 03/25/14  Yes Rowe Clack, MD  traMADol (ULTRAM) 50 MG tablet Take 1 tablet (50 mg total) by mouth every 6 (six) hours as needed. for pain 03/25/14  Yes Rowe Clack, MD  traZODone (DESYREL) 50 MG tablet Take 1 tablet (50 mg total) by mouth at bedtime. 04/21/14  Yes Rowe Clack, MD  HYDROcodone-acetaminophen (NORCO/VICODIN) 5-325 MG per tablet Take 1 tablet by mouth  every 6 (six) hours as needed for moderate pain. Patient not taking: Reported on 05/31/2014 03/30/14   Mauricio Po, FNP     ROS: The patient denies night sweats, unintentional weight loss, chest pain, palpitations,  dyspnea on exertion, nausea, vomiting, abdominal pain, dysuria, hematuria, melena, numbness, weakness, or tingling.   All other systems have been reviewed and were otherwise negative with the exception of those mentioned in the HPI and as above.    PHYSICAL EXAM: Filed Vitals:   05/31/14 1004  BP: 168/86  Pulse: 68  Temp: 98.3 F (36.8 C)  Resp: 18   Filed Vitals:   05/31/14 1004  Weight: 236 lb (107.049 kg)   Body mass index is 43.15 kg/(m^2).  General: Alert, no acute distress HEENT:  Normocephalic, atraumatic, oropharynx patent. EOMI, PERRLA Cardiovascular:  Regular rate and rhythm, no rubs murmurs or gallops.  No Carotid  bruits, radial pulse intact. No pedal edema.  Respiratory: +wheezes, No rales, or rhonchi.  No cyanosis, no use of accessory musculature GI: No organomegaly, abdomen is soft and non-tender, positive bowel sounds.  No masses. Skin: No rashes. Neurologic: Facial musculature symmetric. Psychiatric: Patient is appropriate throughout our interaction. Lymphatic: No cervical lymphadenopathy Musculoskeletal: Gait intact.   LABS: Results for orders placed or performed in visit on 36/62/94  Basic Metabolic Panel (BMET)  Result Value Ref Range   Sodium 139 135 - 145 mEq/L   Potassium 4.5 3.5 - 5.1 mEq/L   Chloride 104 96 - 112 mEq/L   CO2 28 19 - 32 mEq/L   Glucose, Bld 111 (H) 70 - 99 mg/dL   BUN 14 6 - 23 mg/dL   Creatinine, Ser 1.0 0.4 - 1.2 mg/dL   Calcium 9.1 8.4 - 10.5 mg/dL   GFR 72.70 >60.00 mL/min  TSH  Result Value Ref Range   TSH 0.95 0.35 - 4.50 uIU/mL     EKG/XRAY:   Primary read interpreted by Dr. Marin Comment at [facility]. Right central congestion/increase vascular markings vs possible right middle lobe infiltrate   ASSESSMENT/PLAN: Encounter Diagnoses  Name Primary?  Marland Kitchen Asthma with acute exacerbation, mild persistent Yes  . Acute bronchitis, unspecified organism   . Tobacco use disorder   . Wheezing   . Cough   . 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 Patient is aware that all medications have potential sideeffects and we are unable to predict every sideeffect or drug-drug interaction that may occur.  , [place], DO 05/31/2014 11:21 AM

## 2014-06-14 ENCOUNTER — Other Ambulatory Visit: Payer: Self-pay | Admitting: Internal Medicine

## 2014-06-14 ENCOUNTER — Telehealth: Payer: Self-pay

## 2014-06-14 NOTE — Telephone Encounter (Signed)
Left message for pt to call back  °

## 2014-06-14 NOTE — Telephone Encounter (Signed)
Pt called wanting a refill on chlorpheniramine-HYDROcodone [facility]) 10-8 MG/5ML Gaspar Skeeters [[id number]] . IO#[phone]

## 2014-06-15 ENCOUNTER — Other Ambulatory Visit: Payer: Self-pay | Admitting: Radiology

## 2014-06-15 MED ORDER — HYDROCOD POLST-CHLORPHEN POLST 10-8 MG/5ML PO LQCR: 5.0000 mL | Freq: Two times a day (BID) | ORAL | Status: DC | PRN

## 2014-06-15 NOTE — Telephone Encounter (Signed)
Pt called to find out if we had called in her cough medicine. Please call [place]

## 2014-06-17 NOTE — Telephone Encounter (Signed)
Left message, rx ready for pick up

## 2014-08-12 ENCOUNTER — Other Ambulatory Visit: Payer: Self-pay | Admitting: Internal Medicine

## 2014-08-13 NOTE — Telephone Encounter (Signed)
Pt never p/up her Rx and I am shredding it.

## 2014-08-16 MED ORDER — ALPRAZOLAM 0.5 MG PO TABS: 0.5000 mg | ORAL_TABLET | Freq: Two times a day (BID) | ORAL | Status: DC | PRN

## 2014-08-16 NOTE — Telephone Encounter (Signed)
Called refill in for the alprazolam gave md approval.../lmb

## 2014-08-16 NOTE — Telephone Encounter (Signed)
Md out of office pls advise on refill...Wendy Carr

## 2014-09-03 ENCOUNTER — Other Ambulatory Visit: Payer: Self-pay | Admitting: Internal Medicine

## 2014-09-03 NOTE — Telephone Encounter (Signed)
Faxed tramadol script back to [name]...Wendy Carr

## 2014-09-03 NOTE — Telephone Encounter (Signed)
Medications refilled

## 2014-09-13 ENCOUNTER — Other Ambulatory Visit: Payer: Self-pay | Admitting: Internal Medicine

## 2014-09-14 NOTE — Telephone Encounter (Signed)
MD out of office pls advise on refill.../lmb 

## 2014-09-14 NOTE — Telephone Encounter (Signed)
Faxed script back to ...[name]

## 2014-09-14 NOTE — Telephone Encounter (Signed)
Printed and signed.  

## 2014-09-24 ENCOUNTER — Other Ambulatory Visit: Payer: Self-pay | Admitting: Internal Medicine

## 2014-10-05 ENCOUNTER — Other Ambulatory Visit: Payer: Self-pay | Admitting: Internal Medicine

## 2014-10-06 ENCOUNTER — Telehealth: Payer: Self-pay | Admitting: Internal Medicine

## 2014-10-06 MED ORDER — LISINOPRIL-HYDROCHLOROTHIAZIDE 10-12.5 MG PO TABS: 1.0000 | ORAL_TABLET | Freq: Every day | ORAL | Status: DC

## 2014-10-06 NOTE — Telephone Encounter (Signed)
erx done

## 2014-10-06 NOTE — Telephone Encounter (Signed)
Patient states she has been out of lisinopril for 5 days.  She has scheduled a med follow up with Marya Amsler on 6/6.  She is requesting enough lisinopril to get her  Through until that date to be sent to [facility].

## 2014-10-11 ENCOUNTER — Ambulatory Visit (INDEPENDENT_AMBULATORY_CARE_PROVIDER_SITE_OTHER): Payer: 59 | Admitting: Family

## 2014-10-11 ENCOUNTER — Encounter: Payer: Self-pay | Admitting: Family

## 2014-10-11 VITALS — BP 172/90 | HR 76 | Temp 97.9°F | Resp 18 | Ht 62.0 in | Wt 232.8 lb

## 2014-10-11 DIAGNOSIS — I10 Essential (primary) hypertension: Secondary | ICD-10-CM

## 2014-10-11 DIAGNOSIS — R05 Cough: Secondary | ICD-10-CM

## 2014-10-11 DIAGNOSIS — F411 Generalized anxiety disorder: Secondary | ICD-10-CM

## 2014-10-11 DIAGNOSIS — R059 Cough, unspecified: Secondary | ICD-10-CM | POA: Insufficient documentation

## 2014-10-11 MED ORDER — BENZONATATE 100 MG PO CAPS
200.0000 mg | ORAL_CAPSULE | Freq: Two times a day (BID) | ORAL | Status: DC | PRN
Start: 2014-10-11 — End: 2015-05-25

## 2014-10-11 MED ORDER — HYDROCOD POLST-CPM POLST ER 10-8 MG/5ML PO SUER: 5.0000 mL | Freq: Every evening | ORAL | Status: DC | PRN

## 2014-10-11 MED ORDER — TRAMADOL HCL 50 MG PO TABS: 50.0000 mg | ORAL_TABLET | Freq: Four times a day (QID) | ORAL | Status: DC | PRN

## 2014-10-11 MED ORDER — LISINOPRIL-HYDROCHLOROTHIAZIDE 20-25 MG PO TABS: 1.0000 | ORAL_TABLET | Freq: Every day | ORAL | Status: DC

## 2014-10-11 MED ORDER — TRAZODONE HCL 50 MG PO TABS
50.0000 mg | ORAL_TABLET | Freq: Every day | ORAL | Status: DC
Start: 2014-10-11 — End: 2014-11-15

## 2014-10-11 MED ORDER — ALPRAZOLAM 0.5 MG PO TABS: ORAL_TABLET | ORAL | Status: DC

## 2014-10-11 NOTE — Assessment & Plan Note (Signed)
Stable current dosage of Celexa, BuSpar, and Xanax. Continue current dosages of Celexa, BuSpar, and Xanax. Controlled substance contract signed for Dr. Asa Lente and urine drug screen completed.

## 2014-10-11 NOTE — Progress Notes (Signed)
Pre visit review using our clinic review tool, if applicable. No additional management support is needed unless otherwise documented below in the visit note. 

## 2014-10-11 NOTE — Patient Instructions (Addendum)
Thank you for choosing [facility].  Summary/Instructions:  Please increase your lisinopril-hydrochlorothiazide to 2 tablets daily. When we refill the medication, change from taking 2 tablets to 1 tablet daily.  Please monitor your blood pressure at home.  Please continue to take your medications as prescribed.  Please follow up in 1 month for a blood pressure check.   Your prescription(s) have been submitted to your pharmacy or been printed and provided for you. Please take as directed and contact our office if you believe you are having problem(s) with the medication(s) or have any questions.  If your symptoms worsen or fail to improve, please contact our office for further instruction, or in case of emergency go directly to the emergency room at the closest medical facility.

## 2014-10-11 NOTE — Progress Notes (Signed)
Subjective:    Patient ID: Wendy Carr, female    DOB: 05/23/1955, 59 y.o.   MRN: [id number]  Chief Complaint  Patient presents with  . Medication follow up    need refill of lisinopril needed an OV to get refill, would also like a refill of trazodone, tramodol and xanax, would like tussonex for a bad cough she is having    HPI:  Wendy Carr is a 59 y.o. female with a PMH of hypertension, asthma, GERD, arthritis, anxiety, and substance abuse who presents today for an office follow-up.  1.) Hypertension - Previously maintained on lisinopril-hctz. Indicates she takes her medication as prescribed. She did recently run out of the medication for 1 week and then restarted it about 4 days ago. Does not currently take her blood pressures at home. Does report headaches and dizziness lately.  BP Readings from Last 3 Encounters:  10/11/14 172/90  05/31/14 148/78  03/30/14 144/102    2.) Coughing - Associated symptom of coughing has been going on for about 5 days. Modifying factors include Mucinex and Nyquil which have helped minimally. Denies any recent antibiotic use. Timing of the cough is worse at night and the severity of the cough is enough to keep her at night on occasion.  3.) Anxiety - Currently maintained on Celexa, Buspar, Xanax. Takes the medication as prescribed. Denies any adverse side effects. Indicates that she is using the Xanax approximately twice per day.    Allergies  Allergen Reactions  . Naproxen Other (See Comments)    Stomach cramps     Current Outpatient Prescriptions on File Prior to Visit  Medication Sig Dispense Refill  . albuterol (VENTOLIN HFA) 108 (90 BASE) MCG/ACT inhaler INHALE 2 PUFFS BY MOUTH 4 TIMES DAILY AS NEEDED FOR SHORTNESS OF BREATH 18 g 11  . ALPRAZolam (XANAX) 0.5 MG tablet TAKE 1 TABLET BY MOUTH 2 TIMES DAILY AS NEEDED FOR SLEEP OR ANXIETY 60 tablet 0  . beclomethasone (QVAR) 40 MCG/ACT inhaler Inhale 2 puffs into the lungs 2 (two)  times daily. 1 Inhaler 11  . busPIRone (BUSPAR) 10 MG tablet TAKE 2 TABLETS BY MOUTH TWICE DAILY 120 tablet 10  . chlorpheniramine-HYDROcodone (TUSSIONEX PENNKINETIC ER) 10-8 MG/5ML LQCR Take 5 mLs by mouth every 12 (twelve) hours as needed for cough. 120 mL 0  . citalopram (CELEXA) 40 MG tablet TAKE 1 TABLET BY MOUTH ONCE DAILY 30 tablet 5  . ipratropium-albuterol (DUONEB) 0.5-2.5 (3) MG/3ML SOLN Take 3 mLs by nebulization every 6 (six) hours as needed. 360 mL 3  . lisinopril-hydrochlorothiazide (PRINZIDE,ZESTORETIC) 10-12.5 MG per tablet Take 1 tablet by mouth daily. 90 tablet 0  . traMADol (ULTRAM) 50 MG tablet TAKE 1 TABLET BY MOUTH EVERY 6 HOURS AS NEEDED 60 tablet 0  . traZODone (DESYREL) 50 MG tablet TAKE 1 TABLET BY MOUTH AT BEDTIME. 30 tablet 0   No current facility-administered medications on file prior to visit.      Review of Systems  Constitutional: Negative for fever and chills.  HENT: Positive for ear pain and sore throat. Negative for congestion and sinus pressure.   Respiratory: Positive for cough. Negative for chest tightness and shortness of breath.   Cardiovascular: Negative for chest pain, palpitations and leg swelling.  Neurological: Positive for headaches.      Objective:    BP 172/90 mmHg  Pulse 76  Temp(Src) 97.9 F (36.6 C) (Oral)  Resp 18  Ht 5\' 2"  (1.575 m)  Wt 232 lb 12.8 oz (105.597  kg)  BMI 42.57 kg/m2  SpO2 99% Nursing note and vital signs reviewed.  Physical Exam  Constitutional: She is oriented to person, place, and time. She appears well-developed and well-nourished. No distress.  HENT:  Right Ear: Hearing, tympanic membrane, external ear and ear canal normal.  Left Ear: Hearing, tympanic membrane, external ear and ear canal normal.  Nose: Nose normal. Right sinus exhibits no maxillary sinus tenderness and no frontal sinus tenderness. Left sinus exhibits no maxillary sinus tenderness and no frontal sinus tenderness.  Mouth/Throat: Uvula is  midline, oropharynx is clear and moist and mucous membranes are normal.  Cardiovascular: Normal rate, regular rhythm, normal heart sounds and intact distal pulses.   Pulmonary/Chest: Effort normal and breath sounds normal.  Neurological: She is alert and oriented to person, place, and time.  Skin: Skin is warm and dry.  Psychiatric: She has a normal mood and affect. Her behavior is normal. Judgment and thought content normal.       Assessment & Plan:   Problem List Items Addressed This Visit      Cardiovascular and Mediastinum   Essential hypertension    Blood pressure remains uncontrolled with current regimen of lisinopril-hydrochlorothiazide. Patient notes she missed a week of medication secondary to an availability. She recently restarted the medication on 10/08/14. Increase lisinopril-hydrochlorothiazide to 20-25. Follow-up in one month for blood pressure check.      Relevant Medications   lisinopril-hydrochlorothiazide (PRINZIDE,ZESTORETIC) 20-25 MG per tablet     Other   Anxiety state - Primary    Stable current dosage of Celexa, BuSpar, and Xanax. Continue current dosages of Celexa, BuSpar, and Xanax. Controlled substance contract signed for Dr. Asa Lente and urine drug screen completed.      Relevant Medications   ALPRAZolam (XANAX) 0.5 MG tablet   traZODone (DESYREL) 50 MG tablet   Cough    Symptoms and exam consistent with upper respiratory infection, however cannot rule out underlying asthma as result of cough. Treat conservatively at this time with over-the-counter medications as needed for symptom relief and supportive care. Start Tussionex as needed for cough and sleep. Start Tessalon as needed for cough during the day. Follow-up if symptoms worsen or fail to improve.      Relevant Medications   chlorpheniramine-HYDROcodone (TUSSIONEX PENNKINETIC ER) 10-8 MG/5ML SUER   benzonatate (TESSALON) 100 MG capsule

## 2014-10-11 NOTE — Assessment & Plan Note (Signed)
Blood pressure remains uncontrolled with current regimen of lisinopril-hydrochlorothiazide. Patient notes she missed a week of medication secondary to an availability. She recently restarted the medication on 10/08/14. Increase lisinopril-hydrochlorothiazide to 20-25. Follow-up in one month for blood pressure check.

## 2014-10-11 NOTE — Assessment & Plan Note (Signed)
Symptoms and exam consistent with upper respiratory infection, however cannot rule out underlying asthma as result of cough. Treat conservatively at this time with over-the-counter medications as needed for symptom relief and supportive care. Start Tussionex as needed for cough and sleep. Start Tessalon as needed for cough during the day. Follow-up if symptoms worsen or fail to improve.

## 2014-11-09 ENCOUNTER — Other Ambulatory Visit: Payer: Self-pay | Admitting: Family

## 2014-11-09 MED ORDER — TRAMADOL HCL 50 MG PO TABS: 50.0000 mg | ORAL_TABLET | Freq: Four times a day (QID) | ORAL | Status: DC | PRN

## 2014-11-15 ENCOUNTER — Other Ambulatory Visit: Payer: Self-pay | Admitting: Family

## 2014-11-15 ENCOUNTER — Telehealth: Payer: Self-pay

## 2014-11-15 DIAGNOSIS — F411 Generalized anxiety disorder: Secondary | ICD-10-CM

## 2014-11-15 MED ORDER — ALPRAZOLAM 0.5 MG PO TABS: ORAL_TABLET | ORAL | Status: DC

## 2014-11-15 NOTE — Telephone Encounter (Signed)
Medication refilled

## 2014-11-15 NOTE — Telephone Encounter (Signed)
Pts pharmacy faxed over a request for alprazolam 0.5 mg. Last refill 10/11/2014. Please advise.

## 2014-11-15 NOTE — Telephone Encounter (Signed)
Rx has been faxed to pharmacy.

## 2014-12-08 ENCOUNTER — Telehealth: Payer: Self-pay | Admitting: *Deleted

## 2014-12-08 DIAGNOSIS — I10 Essential (primary) hypertension: Secondary | ICD-10-CM

## 2014-12-08 MED ORDER — LISINOPRIL-HYDROCHLOROTHIAZIDE 20-25 MG PO TABS: 1.0000 | ORAL_TABLET | Freq: Every day | ORAL | Status: DC

## 2014-12-08 NOTE — Telephone Encounter (Signed)
Received call pt states she is needing refill on her lisinopril/hctz. Last saw Marya Amsler and he increase to 20/25 mg. Inform pt will send to [facility].Marland Kitchen/LMB

## 2014-12-13 ENCOUNTER — Telehealth: Payer: Self-pay

## 2014-12-13 MED ORDER — TRAMADOL HCL 50 MG PO TABS: 50.0000 mg | ORAL_TABLET | Freq: Four times a day (QID) | ORAL | Status: DC | PRN

## 2014-12-13 NOTE — Addendum Note (Signed)
Addended by: Mauricio Po D on: 12/13/2014 11:50 AM   Modules accepted: Orders

## 2014-12-13 NOTE — Telephone Encounter (Signed)
Medication refilled

## 2014-12-13 NOTE — Telephone Encounter (Signed)
Received refill request from [facility]  request refills for Tramadol 50mg  . Rx last written 11/09/14 #60/0rf and pt last seen 10/21/14 . Please advise Thanks

## 2014-12-16 ENCOUNTER — Other Ambulatory Visit: Payer: Self-pay | Admitting: Family

## 2015-01-13 ENCOUNTER — Other Ambulatory Visit: Payer: Self-pay | Admitting: Family

## 2015-03-09 ENCOUNTER — Other Ambulatory Visit: Payer: Self-pay | Admitting: Internal Medicine

## 2015-03-12 ENCOUNTER — Other Ambulatory Visit: Payer: Self-pay

## 2015-03-12 MED ORDER — BUSPIRONE HCL 10 MG PO TABS: 20.0000 mg | ORAL_TABLET | Freq: Two times a day (BID) | ORAL | Status: DC

## 2015-03-15 ENCOUNTER — Encounter: Payer: Self-pay | Admitting: Internal Medicine

## 2015-05-11 MED FILL — ALPRAZolam 0.5 MG TABS: 0.5 | 30 days supply | Qty: 60 | Fill #4

## 2015-05-17 MED FILL — traMADol HCL 50 MG TABS: 50 | 15 days supply | Qty: 60 | Fill #4

## 2015-05-17 MED FILL — traZODone HCL 50 MG TABS: 50 | 30 days supply | Qty: 30 | Fill #4

## 2015-05-25 ENCOUNTER — Ambulatory Visit (INDEPENDENT_AMBULATORY_CARE_PROVIDER_SITE_OTHER): Payer: 59 | Admitting: Family Medicine

## 2015-05-25 ENCOUNTER — Ambulatory Visit (INDEPENDENT_AMBULATORY_CARE_PROVIDER_SITE_OTHER): Payer: 59

## 2015-05-25 VITALS — BP 128/88 | HR 66 | Temp 97.8°F | Resp 19 | Ht 63.0 in | Wt 219.0 lb

## 2015-05-25 DIAGNOSIS — M25562 Pain in left knee: Secondary | ICD-10-CM

## 2015-05-25 DIAGNOSIS — M79631 Pain in right forearm: Secondary | ICD-10-CM | POA: Diagnosis not present

## 2015-05-25 DIAGNOSIS — R05 Cough: Secondary | ICD-10-CM

## 2015-05-25 DIAGNOSIS — R059 Cough, unspecified: Secondary | ICD-10-CM

## 2015-05-25 DIAGNOSIS — M25531 Pain in right wrist: Secondary | ICD-10-CM | POA: Diagnosis not present

## 2015-05-25 DIAGNOSIS — J4521 Mild intermittent asthma with (acute) exacerbation: Secondary | ICD-10-CM

## 2015-05-25 MED ORDER — DOXYCYCLINE HYCLATE 100 MG PO TABS: 100.0000 mg | ORAL_TABLET | Freq: Two times a day (BID) | ORAL | Status: DC

## 2015-05-25 MED ORDER — HYDROCODONE-HOMATROPINE 5-1.5 MG/5ML PO SYRP: ORAL_SOLUTION | ORAL | Status: DC

## 2015-05-25 MED ORDER — AZITHROMYCIN 250 MG PO TABS: ORAL_TABLET | ORAL | Status: DC

## 2015-05-25 MED ORDER — BENZONATATE 100 MG PO CAPS: 200.0000 mg | ORAL_CAPSULE | Freq: Two times a day (BID) | ORAL | Status: DC | PRN

## 2015-05-25 MED FILL — BENZONATATE 100 MG CAPSULE: 100 | 8 days supply | Qty: 30 | Fill #0

## 2015-05-25 MED FILL — DOXYCYCLINE HYCLATE 100 MG: 100 | 10 days supply | Qty: 20 | Fill #0

## 2015-05-25 MED FILL — HYDROCODONE-HOMATROPINE SYR: 5-1.5 | 24 days supply | Qty: 120 | Fill #0

## 2015-05-25 NOTE — Progress Notes (Addendum)
Subjective:    Patient ID: Wendy Carr, female    DOB: [birth year], 58 y.o.   MRN: [id number] By signing my name below, I, Zola Button, attest that this documentation has been prepared under the direction and in the presence of Merri Ray, MD.  Electronically Signed: Zola Button, Medical Scribe. 05/25/2015. 9:21 AM.  HPI HPI Comments: Wendy Carr is a 60 y.o. female who presents to the [facility] complaining of a fall that occurred after she slipped on ice about 1 week ago outside of her home. She fell onto her left knee and right hand. She felt pain in her hand immediately, but she did not develop pain into her knee until 3 days later. She is right hand dominant. Patient notes that there is a knot in her knee that was there before she fell. She has tried ice, heat, and ibuprofen for both the knee pain and the hand pain but without relief. The hand pain is worse with use of the hand (ex. writing). Patient denies prior fractures and surgeries in her knee.  Patient also complains of gradual onset, worsening cough that started 1 week ago. She reports having associated fatigue, sore throat, congestion, rhinorrhea, chills, and wheezing. The cough is worse at night when laying down. She has been using her albuterol twice a day and her breathing treatment every night during her illness. Patient denies fever. She has a history of asthma and takes Qvar. She has not had the flu shot this year. 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  Patient Active Problem List   Diagnosis Date Noted  . Cough 10/11/2014  . Arthritis 03/30/2014  . Tooth pain 03/30/2014  . Opiate withdrawal ([place]) 12/16/2011  . Cocaine abuse 12/16/2011  . BACK PAIN 09/03/2008  . HYPERGLYCEMIA, BORDERLINE 09/03/2008  . ANEMIA-NOS 08/20/2008  . Anxiety state 08/20/2008  . Essential hypertension 08/20/2008  . ALLERGIC RHINITIS  08/20/2008  . Asthma 08/20/2008  . GERD 08/20/2008   Past Medical History  Diagnosis Date  . GERD   . HYPERTENSION   . ALLERGIC RHINITIS   . ANEMIA-NOS   . ANXIETY   . ASTHMA   . HYPOTHYROIDISM   . HYPERGLYCEMIA, BORDERLINE   . Personal history of colonic polyps 2011    last colonscopy 2011: no polyps.   Past Surgical History  Procedure Laterality Date  . Myomectomy  1997  . Breast surgery  2004    Lumpectomy, right side-Benign  . Thyroidectomy, partial  2004    Left, benign  . Vein surgery  1997    left arm  . Foot surgery  2006    right  . Hernia repair  1995    [facility]   Allergies  Allergen Reactions  . Naproxen Other (See Comments)    Stomach cramps    Prior to Admission medications   Medication Sig Start Date End Date Taking? Authorizing Provider  albuterol (VENTOLIN HFA) 108 (90 BASE) MCG/ACT inhaler INHALE 2 PUFFS BY MOUTH 4 TIMES DAILY AS NEEDED FOR SHORTNESS OF BREATH 05/31/14  Yes Thao P Le, DO  ALPRAZolam (XANAX) 0.5 MG tablet TAKE 1 TABLET BY MOUTH TWICE DAILY AS NEEDED FOR SLEEP OR ANXIETY 01/13/15  Yes Golden Circle, FNP  beclomethasone (QVAR) 40 MCG/ACT inhaler Inhale 2 puffs into the lungs 2 (two) times daily. 05/31/14  Yes Thao P Le, DO  busPIRone (BUSPAR) 10 MG tablet Take 2  tablets (20 mg total) by mouth 2 (two) times daily. 03/12/15  Yes Rowe Clack, MD  citalopram (CELEXA) 40 MG tablet TAKE 1 TABLET BY MOUTH ONCE DAILY 08/13/14  Yes Rowe Clack, MD  ipratropium-albuterol (DUONEB) 0.5-2.5 (3) MG/3ML SOLN Take 3 mLs by nebulization every 6 (six) hours as needed. 05/31/14  Yes Thao P Le, DO  lisinopril-hydrochlorothiazide (PRINZIDE,ZESTORETIC) 20-25 MG tablet TAKE 1 TABLET BY MOUTH DAILY. 03/11/15  Yes Rowe Clack, MD  traMADol (ULTRAM) 50 MG tablet TAKE 1 TABLET BY MOUTH EVERY 6 HOURS AS NEEDED 01/13/15  Yes Golden Circle, FNP  traZODone (DESYREL) 50 MG tablet TAKE 1 TABLET BY MOUTH ONCE DAILY AT BEDTIME 11/16/14  Yes Golden Circle, FNP  benzonatate (TESSALON) 100 MG capsule Take 2 capsules (200 mg total) by mouth 2 (two) times daily as needed. Patient not taking: Reported on 05/25/2015 10/11/14   Golden Circle, FNP  chlorpheniramine-HYDROcodone [facility] PENNKINETIC ER) 10-8 MG/5ML SUER Take 5 mLs by mouth at bedtime as needed for cough. Patient not taking: Reported on 05/25/2015 10/11/14   Golden Circle, [place]   Social History   Social History  . Marital Status: Married    Spouse Name: N/A  . Number of Children: N/A  . Years of Education: N/A   Occupational History  . Not on file.   Social History Main Topics  . Smoking status: Current Every Day Smoker -- 0.40 packs/day for 2 years    Types: Cigarettes  . Smokeless tobacco: Not on file     Comment: Married 2009-lives with spouse, no kids  . Alcohol Use: No  . Drug Use: Yes    Special: Cocaine     Comment: [place]  . Sexual Activity: No   Other Topics Concern  . Not on file   Social History Narrative     Review of Systems  Constitutional: Positive for chills and fatigue. Negative for fever.  HENT: Positive for congestion, rhinorrhea and sore throat.   Respiratory: Positive for cough and wheezing.   Musculoskeletal: Positive for arthralgias.       Objective:   Physical Exam  Constitutional: She is oriented to person, place, and time. She appears well-developed and well-nourished. No distress.  HENT:  Head: Normocephalic and atraumatic.  Right Ear: Hearing, tympanic membrane, external ear and ear canal normal.  Left Ear: Hearing, tympanic membrane, external ear and ear canal normal.  Nose: Nose normal.  Mouth/Throat: Oropharynx is clear and moist. No oropharyngeal exudate, posterior oropharyngeal edema or posterior oropharyngeal erythema.  Discomfort over maxillary sinuses bilaterally.  Eyes: Conjunctivae and EOM are normal. Pupils are equal, round, and reactive to light.  Cardiovascular: Normal rate, regular rhythm, normal heart sounds  and intact distal pulses.   No murmur heard. Pulmonary/Chest: Effort normal and breath sounds normal. No respiratory distress. She has no wheezes. She has no rhonchi.  Clear to auscultation bilaterally. Few coughs while in the room, but speaking normally.  Musculoskeletal: She exhibits tenderness. She exhibits no edema.  Tender along mid aspect of radius of right forearm. Tender along distal ulna. Tenderness over hook of hamate and volar ulnar side of wrist. Scaphoid non-tender. Thumb and remainder of hand non-tender.  Full ROM of left knee. Skin intact, no effusion or warmth. Tenderness along distal pole of patella and slightly to lateral joint line anteriorly. Also tender over patellar tendon but normal strength with extension.  Neurological: She is alert and oriented to person, place, and time.  NVI distally.  Skin: Skin is warm and dry. No rash noted.  Psychiatric: She has a normal mood and affect. Her behavior is normal.  Vitals reviewed.  [facility] (PRIMARY) x-ray report read by Dr. Carlota Raspberry:  CXR - Few increased right and left lower lobe bronchitic markings without infiltrate. Right Forearm - No fracture. Right Wrist - No apparent fracture. Left Knee - No fracture.  Filed Vitals:   05/25/15 0849  BP: 128/88  Pulse: 66  Temp: 97.8 F (36.6 C)  TempSrc: Oral  Resp: 19  Height: 5\' 3"  (1.6 m)  Weight: 219 lb (99.338 kg)  SpO2: 98%       Assessment & Plan:   Wendy Carr is a 60 y.o. female Cough - Plan: DG Chest 2 View, benzonatate (TESSALON) 100 MG capsule, HYDROcodone-homatropine (HYCODAN) 5-1.5 MG/5ML syrup, doxycycline (VIBRA-TABS) 100 MG tablet, DISCONTINUED: azithromycin (ZITHROMAX) 250 MG tablet Asthma, mild intermittent, with acute exacerbation - Plan: DG Chest 2 View  -   suspected viral URI with flare of asthma. No wheeze on exam, overall reassuring chest x-ray.  - Medicare discussed with Tessalon Perles during the day, hydrocodone cough syrup at night if not wheezing  or short of breath. Discussed appropriate use of albuterol for wheezing or shortness of breath and avoid hydrocodone if  Dyspnea or wheezing.   Right wrist pain - Plan: DG Wrist Complete Right Right forearm pain - Plan: DG Forearm Right  -  Contusion likely no apparent fracture. Can try wrist splint for the next week, then follow-up if still requiring splint or persistent pain. Sooner if worse.  Left knee pain - Plan: DG Knee Complete 4 Views Left  - Contusion. No fracture seen. Able to weight-bear, no effusion. If symptomatic into next week, refer to orthopedist. rtc sooner if worse.   Meds ordered this encounter  Medications  . benzonatate (TESSALON) 100 MG capsule    Sig: Take 2 capsules (200 mg total) by mouth 2 (two) times daily as needed.    Dispense:  30 capsule    Refill:  1  . HYDROcodone-homatropine (HYCODAN) 5-1.5 MG/5ML syrup    Sig: 32m by mouth a bedtime as needed for cough.    Dispense:  120 mL    Refill:  0  . azithromycin (ZITHROMAX) 250 MG tablet    Sig: Take 2 pills by mouth on day 1, then 1 pill by mouth per day on days 2 through 5.    Dispense:  6 tablet    Refill:  0   Patient Instructions   I do not see any broken bones on your x-rays today, and your chest x-ray overall appears to be okay.  for wrist pain  You can try the brace for the next 1 week, then recheck if still having pain.  For the knee pain, avoid direct pressure to it, over-the-counter Tylenol is safest for now, ice if needed. If not improving into the next week, I can refer you to orthopedics for this and your other pains. Return sooner if worse.  you likely picked up a virus that caused a cough then flared the asthma. Albuterol as needed up to every 4-6 hours during the day, and nebulizer treatment if needed at night. Tessalon Perles up to 3 times per day for cough, then at nighttime as long as you are not wheezing or short of breath, can use the hydrocodone cough syrup. Again, do NOT use the  hydrocodone if you are wheezing as the albuterol is the treatment for wheezing or  asthma symptoms, and hydrocodone can suppress your drive to breathe. If it is an irritant cough that is keeping up at night, the hydrocodone can help you sleep.   if productive cough is not improving in the next 2-3 days, can start the antibiotic that was printed today (doxycycline).  Return to the clinic or go to the nearest emergency room if any of your symptoms worsen or new symptoms occur,  Including frequent use of albuterol over the next 3 days.  Asthma, Acute Bronchospasm Acute bronchospasm caused by asthma is also referred to as an asthma attack. Bronchospasm means your air passages become narrowed. The narrowing is caused by inflammation and tightening of the muscles in the air tubes (bronchi) in your lungs. This can make it hard to breathe or cause you to wheeze and cough. CAUSES Possible triggers are:  Animal dander from the skin, hair, or feathers of animals.  Dust mites contained in house dust.  Cockroaches.  Pollen from trees or grass.  Mold.  Cigarette or tobacco smoke.  Air pollutants such as dust, household cleaners, hair sprays, aerosol sprays, paint fumes, strong chemicals, or strong odors.  Cold air or weather changes. Cold air may trigger inflammation. Winds increase molds and pollens in the air.  Strong emotions such as crying or laughing hard.  Stress.  Certain medicines such as aspirin or beta-blockers.  Sulfites in foods and drinks, such as dried fruits and wine.  Infections or inflammatory conditions, such as a flu, cold, or inflammation of the nasal membranes (rhinitis).  Gastroesophageal reflux disease (GERD). GERD is a condition where stomach acid backs up into your esophagus.  Exercise or strenuous activity. SIGNS AND SYMPTOMS   Wheezing.  Excessive coughing, particularly at night.  Chest tightness.  Shortness of breath. DIAGNOSIS  Your health care provider  will ask you about your medical history and perform a physical exam. A chest X-ray or blood testing may be performed to look for other causes of your symptoms or other conditions that may have triggered your asthma attack. TREATMENT  Treatment is aimed at reducing inflammation and opening up the airways in your lungs. Most asthma attacks are treated with inhaled medicines. These include quick relief or rescue medicines (such as bronchodilators) and controller medicines (such as inhaled corticosteroids). These medicines are sometimes given through an inhaler or a nebulizer. Systemic steroid medicine taken by mouth or given through an IV tube also can be used to reduce the inflammation when an attack is moderate or severe. Antibiotic medicines are only used if a bacterial infection is present.  HOME CARE INSTRUCTIONS   Rest.  Drink plenty of liquids. This helps the mucus to remain thin and be easily coughed up. Only use caffeine in moderation and do not use alcohol until you have recovered from your illness.  Do not smoke. Avoid being exposed to secondhand smoke.  You play a critical role in keeping yourself in good health. Avoid exposure to things that cause you to wheeze or to have breathing problems.  Keep your medicines up-to-date and available. Carefully follow your health care provider's treatment plan.  Take your medicine exactly as prescribed.  When pollen or pollution is bad, keep windows closed and use an air conditioner or go to places with air conditioning.  Asthma requires careful medical care. See your health care provider for a follow-up as advised. If you are more than [redacted] weeks pregnant and you were prescribed any new medicines, let your obstetrician know about the visit and  how you are doing. Follow up with your health care provider as directed.  After you have recovered from your asthma attack, make an appointment with your outpatient doctor to talk about ways to reduce the  likelihood of future attacks. If you do not have a doctor who manages your asthma, make an appointment with a primary care doctor to discuss your asthma. SEEK IMMEDIATE MEDICAL CARE IF:   You are getting worse.  You have trouble breathing. If severe, call your local emergency services (911 in the [place]  You develop chest pain or discomfort.  You are vomiting.  You are not able to keep fluids down.  You are coughing up yellow, green, brown, or bloody sputum.  You have a fever and your symptoms suddenly get worse.  You have trouble swallowing. MAKE SURE YOU:   Understand these instructions.  Will watch your condition.  Will get help right away if you are not doing well or get worse.   This information is not intended to replace advice given to you by your health care provider. Make sure you discuss any questions you have with your health care provider.   Document Released: 08/08/2006 Document Revised: 04/28/2013 Document Reviewed: 10/29/2012 Elsevier Interactive Patient Education 2016 [place] A hand contusion is a deep bruise on your hand area. Contusions are the result of an injury that caused bleeding under the skin. The contusion may turn blue, purple, or yellow. Minor injuries will give you a painless contusion, but more severe contusions may stay painful and swollen for a few weeks. CAUSES  A contusion is usually caused by a blow, trauma, or direct force to an area of the body. SYMPTOMS   Swelling and redness of the injured area.  Discoloration of the injured area.  Tenderness and soreness of the injured area.  Pain. DIAGNOSIS  The diagnosis can be made by taking a history and performing a physical exam. An X-ray, CT scan, or MRI may be needed to determine if there were any associated injuries, such as broken bones (fractures). TREATMENT  Often, the best treatment for a hand contusion is resting, elevating, icing, and applying cold  compresses to the injured area. Over-the-counter medicines may also be recommended for pain control. HOME CARE INSTRUCTIONS   Put ice on the injured area.  Put ice in a plastic bag.  Place a towel between your skin and the bag.  Leave the ice on for 15-20 minutes, 03-04 times a day.  Only take over-the-counter or prescription medicines as directed by your caregiver. Your caregiver may recommend avoiding anti-inflammatory medicines (aspirin, ibuprofen, and naproxen) for 48 hours because these medicines may increase bruising.  If told, use an elastic wrap as directed. This can help reduce swelling. You may remove the wrap for sleeping, showering, and bathing. If your fingers become numb, cold, or blue, take the wrap off and reapply it more loosely.  Elevate your hand with pillows to reduce swelling.  Avoid overusing your hand if it is painful. SEEK IMMEDIATE MEDICAL CARE IF:   You have increased redness, swelling, or pain in your hand.  Your swelling or pain is not relieved with medicines.  You have loss of feeling in your hand or are unable to move your fingers.  Your hand turns cold or blue.  You have pain when you move your fingers.  Your hand becomes warm to the touch.  Your contusion does not improve in 2 days. MAKE SURE YOU:  Understand these instructions.  Will watch your condition.  Will get help right away if you are not doing well or get worse.   This information is not intended to replace advice given to you by your health care provider. Make sure you discuss any questions you have with your health care provider.   Document Released: 10/13/2001 Document Revised: 01/16/2012 Document Reviewed: 10/15/2011 Elsevier Interactive Patient Education 2016 Else[employer] Inc[employer]  Knee Pain Knee pain is a very common symptom and can have many causes. Knee pain often goes away when you follow your health care provider's instructions for relieving pain and discomfort at home.  However, knee pain can develop into a condition that needs treatment. Some conditions may include:  Arthritis caused by wear and tear (osteoarthritis).  Arthritis caused by swelling and irritation (rheumatoid arthritis or gout).  A cyst or growth in your knee.  An infection in your knee joint.  An injury that will not heal.  Damage, swelling, or irritation of the tissues that support your knee (torn ligaments or tendinitis). If your knee pain continues, additional tests may be ordered to diagnose your condition. Tests may include X-rays or other imaging studies of your knee. You may also need to have fluid removed from your knee. Treatment for ongoing knee pain depends on the cause, but treatment may include:  Medicines to relieve pain or swelling.  Steroid injections in your knee.  Physical therapy.  Surgery. HOME CARE INSTRUCTIONS  Take medicines only as directed by your health care provider.  Rest your knee and keep it raised (elevated) while you are resting.  Do not do things that cause or worsen pain.  Avoid high-impact activities or exercises, such as running, jumping rope, or doing jumping jacks.  Apply ice to the knee area:  Put ice in a plastic bag.  Place a towel between your skin and the bag.  Leave the ice on for 20 minutes, 2-3 times a day.  Ask your health care provider if you should wear an elastic knee support.  Keep a pillow under your knee when you sleep.  Lose weight if you are overweight. Extra weight can put pressure on your knee.  Do not use any tobacco products, including cigarettes, chewing tobacco, or electronic cigarettes. If you need help quitting, ask your health care provider. Smoking may slow the healing of any bone and joint problems that you may have. SEEK MEDICAL CARE IF:  Your knee pain continues, changes, or gets worse.  You have a fever along with knee pain.  Your knee buckles or locks up.  Your knee becomes more  swollen. SEEK IMMEDIATE MEDICAL CARE IF:   Your knee joint feels hot to the touch.  You have chest pain or trouble breathing.   This information is not intended to replace advice given to you by your health care provider. Make sure you discuss any questions you have with your health care provider.   Document Released: 02/18/2007 Document Revised: 05/14/2014 Document Reviewed: 12/07/2013 Elsevier Interactive Patient Education [employer].      I personally performed the services described in this documentation, which was scribed in my presence. The recorded information has been reviewed and considered, and addended by me as needed.

## 2015-05-25 NOTE — Patient Instructions (Addendum)
I do not see any broken bones on your x-rays today, and your chest x-ray overall appears to be okay.  for wrist pain  You can try the brace for the next 1 week, then recheck if still having pain.  For the knee pain, avoid direct pressure to it, over-the-counter Tylenol is safest for now, ice if needed. If not improving into the next week, I can refer you to orthopedics for this and your other pains. Return sooner if worse.  you likely picked up a virus that caused a cough then flared the asthma. Albuterol as needed up to every 4-6 hours during the day, and nebulizer treatment if needed at night. Tessalon Perles up to 3 times per day for cough, then at nighttime as long as you are not wheezing or short of breath, can use the hydrocodone cough syrup. Again, do NOT use the hydrocodone if you are wheezing as the albuterol is the treatment for wheezing or asthma symptoms, and hydrocodone can suppress your drive to breathe. If it is an irritant cough that is keeping up at night, the hydrocodone can help you sleep.   if productive cough is not improving in the next 2-3 days, can start the antibiotic that was printed today (doxycycline).  Return to the clinic or go to the nearest emergency room if any of your symptoms worsen or new symptoms occur,  Including frequent use of albuterol over the next 3 days.  Asthma, Acute Bronchospasm Acute bronchospasm caused by asthma is also referred to as an asthma attack. Bronchospasm means your air passages become narrowed. The narrowing is caused by inflammation and tightening of the muscles in the air tubes (bronchi) in your lungs. This can make it hard to breathe or cause you to wheeze and cough. CAUSES Possible triggers are:  Animal dander from the skin, hair, or feathers of animals.  Dust mites contained in house dust.  Cockroaches.  Pollen from trees or grass.  Mold.  Cigarette or tobacco smoke.  Air pollutants such as dust, household cleaners, hair  sprays, aerosol sprays, paint fumes, strong chemicals, or strong odors.  Cold air or weather changes. Cold air may trigger inflammation. Winds increase molds and pollens in the air.  Strong emotions such as crying or laughing hard.  Stress.  Certain medicines such as aspirin or beta-blockers.  Sulfites in foods and drinks, such as dried fruits and wine.  Infections or inflammatory conditions, such as a flu, cold, or inflammation of the nasal membranes (rhinitis).  Gastroesophageal reflux disease (GERD). GERD is a condition where stomach acid backs up into your esophagus.  Exercise or strenuous activity. SIGNS AND SYMPTOMS   Wheezing.  Excessive coughing, particularly at night.  Chest tightness.  Shortness of breath. DIAGNOSIS  Your health care provider will ask you about your medical history and perform a physical exam. A chest X-ray or blood testing may be performed to look for other causes of your symptoms or other conditions that may have triggered your asthma attack. TREATMENT  Treatment is aimed at reducing inflammation and opening up the airways in your lungs. Most asthma attacks are treated with inhaled medicines. These include quick relief or rescue medicines (such as bronchodilators) and controller medicines (such as inhaled corticosteroids). These medicines are sometimes given through an inhaler or a nebulizer. Systemic steroid medicine taken by mouth or given through an IV tube also can be used to reduce the inflammation when an attack is moderate or severe. Antibiotic medicines are only used if  a bacterial infection is present.  HOME CARE INSTRUCTIONS   Rest.  Drink plenty of liquids. This helps the mucus to remain thin and be easily coughed up. Only use caffeine in moderation and do not use alcohol until you have recovered from your illness.  Do not smoke. Avoid being exposed to secondhand smoke.  You play a critical role in keeping yourself in good health. Avoid  exposure to things that cause you to wheeze or to have breathing problems.  Keep your medicines up-to-date and available. Carefully follow your health care provider's treatment plan.  Take your medicine exactly as prescribed.  When pollen or pollution is bad, keep windows closed and use an air conditioner or go to places with air conditioning.  Asthma requires careful medical care. See your health care provider for a follow-up as advised. If you are more than [redacted] weeks pregnant and you were prescribed any new medicines, let your obstetrician know about the visit and how you are doing. Follow up with your health care provider as directed.  After you have recovered from your asthma attack, make an appointment with your outpatient doctor to talk about ways to reduce the likelihood of future attacks. If you do not have a doctor who manages your asthma, make an appointment with a primary care doctor to discuss your asthma. SEEK IMMEDIATE MEDICAL CARE IF:   You are getting worse.  You have trouble breathing. If severe, call your local emergency services (911 in the [place]  You develop chest pain or discomfort.  You are vomiting.  You are not able to keep fluids down.  You are coughing up yellow, green, brown, or bloody sputum.  You have a fever and your symptoms suddenly get worse.  You have trouble swallowing. MAKE SURE YOU:   Understand these instructions.  Will watch your condition.  Will get help right away if you are not doing well or get worse.   This information is not intended to replace advice given to you by your health care provider. Make sure you discuss any questions you have with your health care provider.   Document Released: 08/08/2006 Document Revised: 04/28/2013 Document Reviewed: 10/29/2012 Elsevier Interactive Patient Education 2016 [place] A hand contusion is a deep bruise on your hand area. Contusions are the result of an injury that  caused bleeding under the skin. The contusion may turn blue, purple, or yellow. Minor injuries will give you a painless contusion, but more severe contusions may stay painful and swollen for a few weeks. CAUSES  A contusion is usually caused by a blow, trauma, or direct force to an area of the body. SYMPTOMS   Swelling and redness of the injured area.  Discoloration of the injured area.  Tenderness and soreness of the injured area.  Pain. DIAGNOSIS  The diagnosis can be made by taking a history and performing a physical exam. An X-ray, CT scan, or MRI may be needed to determine if there were any associated injuries, such as broken bones (fractures). TREATMENT  Often, the best treatment for a hand contusion is resting, elevating, icing, and applying cold compresses to the injured area. Over-the-counter medicines may also be recommended for pain control. HOME CARE INSTRUCTIONS   Put ice on the injured area.  Put ice in a plastic bag.  Place a towel between your skin and the bag.  Leave the ice on for 15-20 minutes, 03-04 times a day.  Only take over-the-counter or prescription medicines  as directed by your caregiver. Your caregiver may recommend avoiding anti-inflammatory medicines (aspirin, ibuprofen, and naproxen) for 48 hours because these medicines may increase bruising.  If told, use an elastic wrap as directed. This can help reduce swelling. You may remove the wrap for sleeping, showering, and bathing. If your fingers become numb, cold, or blue, take the wrap off and reapply it more loosely.  Elevate your hand with pillows to reduce swelling.  Avoid overusing your hand if it is painful. SEEK IMMEDIATE MEDICAL CARE IF:   You have increased redness, swelling, or pain in your hand.  Your swelling or pain is not relieved with medicines.  You have loss of feeling in your hand or are unable to move your fingers.  Your hand turns cold or blue.  You have pain when you move your  fingers.  Your hand becomes warm to the touch.  Your contusion does not improve in 2 days. MAKE SURE YOU:   Understand these instructions.  Will watch your condition.  Will get help right away if you are not doing well or get worse.   This information is not intended to replace advice given to you by your health care provider. Make sure you discuss any questions you have with your health care provider.   Document Released: 10/13/2001 Document Revised: 01/16/2012 Document Reviewed: 10/15/2011 Elsevier Interactive Patient Education 2016 [employer]  Knee Pain Knee pain is a very common symptom and can have many causes. Knee pain often goes away when you follow your health care provider's instructions for relieving pain and discomfort at home. However, knee pain can develop into a condition that needs treatment. Some conditions may include:  Arthritis caused by wear and tear (osteoarthritis).  Arthritis caused by swelling and irritation (rheumatoid arthritis or gout).  A cyst or growth in your knee.  An infection in your knee joint.  An injury that will not heal.  Damage, swelling, or irritation of the tissues that support your knee (torn ligaments or tendinitis). If your knee pain continues, additional tests may be ordered to diagnose your condition. Tests may include X-rays or other imaging studies of your knee. You may also need to have fluid removed from your knee. Treatment for ongoing knee pain depends on the cause, but treatment may include:  Medicines to relieve pain or swelling.  Steroid injections in your knee.  Physical therapy.  Surgery. HOME CARE INSTRUCTIONS  Take medicines only as directed by your health care provider.  Rest your knee and keep it raised (elevated) while you are resting.  Do not do things that cause or worsen pain.  Avoid high-impact activities or exercises, such as running, jumping rope, or doing jumping jacks.  Apply ice to the knee  area:  Put ice in a plastic bag.  Place a towel between your skin and the bag.  Leave the ice on for 20 minutes, 2-3 times a day.  Ask your health care provider if you should wear an elastic knee support.  Keep a pillow under your knee when you sleep.  Lose weight if you are overweight. Extra weight can put pressure on your knee.  Do not use any tobacco products, including cigarettes, chewing tobacco, or electronic cigarettes. If you need help quitting, ask your health care provider. Smoking may slow the healing of any bone and joint problems that you may have. SEEK MEDICAL CARE IF:  Your knee pain continues, changes, or gets worse.  You have a fever along with knee  pain.  Your knee buckles or locks up.  Your knee becomes more swollen. SEEK IMMEDIATE MEDICAL CARE IF:   Your knee joint feels hot to the touch.  You have chest pain or trouble breathing.   This information is not intended to replace advice given to you by your health care provider. Make sure you discuss any questions you have with your health care provider.   Document Released: 02/18/2007 Document Revised: 05/14/2014 Document Reviewed: 12/07/2013 Elsevier Interactive Patient Education [employer].

## 2015-06-01 ENCOUNTER — Ambulatory Visit (INDEPENDENT_AMBULATORY_CARE_PROVIDER_SITE_OTHER): Payer: 59 | Admitting: Family Medicine

## 2015-06-01 ENCOUNTER — Ambulatory Visit (INDEPENDENT_AMBULATORY_CARE_PROVIDER_SITE_OTHER): Payer: 59

## 2015-06-01 VITALS — BP 128/80 | HR 89 | Temp 97.8°F | Resp 18 | Ht 63.0 in | Wt 222.2 lb

## 2015-06-01 DIAGNOSIS — R1012 Left upper quadrant pain: Secondary | ICD-10-CM

## 2015-06-01 DIAGNOSIS — M25531 Pain in right wrist: Secondary | ICD-10-CM | POA: Diagnosis not present

## 2015-06-01 DIAGNOSIS — R0781 Pleurodynia: Secondary | ICD-10-CM | POA: Diagnosis not present

## 2015-06-01 DIAGNOSIS — Z79899 Other long term (current) drug therapy: Secondary | ICD-10-CM

## 2015-06-01 DIAGNOSIS — S63501D Unspecified sprain of right wrist, subsequent encounter: Secondary | ICD-10-CM | POA: Diagnosis not present

## 2015-06-01 LAB — COMPREHENSIVE METABOLIC PANEL
ALT: 31 U/L — ABNORMAL HIGH (ref 6–29)
AST: 37 U/L — ABNORMAL HIGH (ref 10–35)
Albumin: 3.8 g/dL (ref 3.6–5.1)
Alkaline Phosphatase: 105 U/L (ref 33–130)
BUN: 18 mg/dL (ref 7–25)
CO2: 27 mmol/L (ref 20–31)
Calcium: 9.6 mg/dL (ref 8.6–10.4)
Chloride: 103 mmol/L (ref 98–110)
Creat: 0.74 mg/dL (ref 0.50–1.05)
Glucose, Bld: 104 mg/dL — ABNORMAL HIGH (ref 65–99)
Potassium: 4.1 mmol/L (ref 3.5–5.3)
Sodium: 138 mmol/L (ref 135–146)
Total Bilirubin: 0.5 mg/dL (ref 0.2–1.2)
Total Protein: 7.6 g/dL (ref 6.1–8.1)

## 2015-06-01 MED ORDER — HYDROCODONE-ACETAMINOPHEN 5-325 MG PO TABS: 1.0000 | ORAL_TABLET | Freq: Four times a day (QID) | ORAL | Status: DC | PRN

## 2015-06-01 MED FILL — HYDROCODON-APAP 5-325: 5-325 | 8 days supply | Qty: 30 | Fill #0

## 2015-06-01 NOTE — Patient Instructions (Addendum)
Because you received an x-ray today, you will receive an invoice from [facility]. Please contact [facility] at [phone] with questions or concerns regarding your invoice. Our billing staff will not be able to assist you with those questions.  Try to wear the brace again overnight and try for during the day when you are doing any activity to prevent reinjury - if you are sitting and not doing an activity with your hands, take it off and do gentle motions as long as they are not hurting.  No lifting with the right hand, try not to lift with your left arm any more than 10 lbs.  If you develop any bowel problems, nausea/vomiting, stomach pain - come back immediately - otherwise I suspect that you bruised that area over your rib and that it will slowly improve over the next month.  After you get the MRI - ask for a copy of the disc so that you can bring it with you to the hand surgery appointment  Wrist Sprain With Rehab A sprain is an injury in which a ligament that maintains the proper alignment of a joint is partially or completely torn. The ligaments of the wrist are susceptible to sprains. Sprains are classified into three categories. Grade 1 sprains cause pain, but the tendon is not lengthened. Grade 2 sprains include a lengthened ligament because the ligament is stretched or partially ruptured. With grade 2 sprains there is still function, although the function may be diminished. Grade 3 sprains are characterized by a complete tear of the tendon or muscle, and function is usually impaired. SYMPTOMS   Pain tenderness, inflammation, and/or bruising (contusion) of the injury.  A "pop" or tear felt and/or heard at the time of injury.  Decreased wrist function. CAUSES  A wrist sprain occurs when a force is placed on one or more ligaments that is greater than it/they can withstand. Common mechanisms of injury include:  Catching a ball with your hands.  Repetitive and/ or  strenuous extension or flexion of the wrist. RISK INCREASES WITH:  Previous wrist injury.  Contact sports (boxing or wrestling).  Activities in which falling is common.  Poor strength and flexibility.  Improperly fitted or padded protective equipment. PREVENTION  Warm up and stretch properly before activity.  Allow for adequate recovery between workouts.  Maintain physical fitness:  Strength, flexibility, and endurance.  Cardiovascular fitness.  Protect the wrist joint by limiting its motion with the use of taping, braces, or splints.  Protect the wrist after injury for 6 to 12 months. PROGNOSIS  The prognosis for wrist sprains depends on the degree of injury. Grade 1 sprains require 2 to 6 weeks of treatment. Grade 2 sprains require 6 to 8 weeks of treatment, and grade 3 sprains require up to 12 weeks.  RELATED COMPLICATIONS   Prolonged healing time, if improperly treated or re-injured.  Recurrent symptoms that result in a chronic problem.  Injury to nearby structures (bone, cartilage, nerves, or tendons).  Arthritis of the wrist.  Inability to compete in athletics at a high level.  Wrist stiffness or weakness.  Progression to a complete rupture of the ligament. TREATMENT  Treatment initially involves resting from any activities that aggravate the symptoms, and the use of ice and medications to help reduce pain and inflammation. Your caregiver may recommend immobilizing the wrist for a period of time in order to reduce stress on the ligament and allow for healing. After immobilization it is important to perform strengthening and  stretching exercises to help regain strength and a full range of motion. These exercises may be completed at home or with a therapist. Surgery is not usually required for wrist sprains, unless the ligament has been ruptured (grade 3 sprain). MEDICATION   If pain medication is necessary, then nonsteroidal anti-inflammatory medications, such as  aspirin and ibuprofen, or other minor pain relievers, such as acetaminophen, are often recommended.  Do not take pain medication for 7 days before surgery.  Prescription pain relievers may be given if deemed necessary by your caregiver. Use only as directed and only as much as you need. HEAT AND COLD  Cold treatment (icing) relieves pain and reduces inflammation. Cold treatment should be applied for 10 to 15 minutes every 2 to 3 hours for inflammation and pain and immediately after any activity that aggravates your symptoms. Use ice packs or massage the area with a piece of ice (ice massage).  Heat treatment may be used prior to performing the stretching and strengthening activities prescribed by your caregiver, physical therapist, or athletic trainer. Use a heat pack or soak your injury in warm water. SEEK MEDICAL CARE IF:  Treatment seems to offer no benefit, or the condition worsens.  Any medications produce adverse side effects. EXERCISES RANGE OF MOTION (ROM) AND STRETCHING EXERCISES - Wrist Sprain  These exercises may help you when beginning to rehabilitate your injury. Your symptoms may resolve with or without further involvement from your physician, physical therapist or athletic trainer. While completing these exercises, remember:   Restoring tissue flexibility helps normal motion to return to the joints. This allows healthier, less painful movement and activity.  An effective stretch should be held for at least 30 seconds.  A stretch should never be painful. You should only feel a gentle lengthening or release in the stretched tissue. RANGE OF MOTION - Wrist Flexion, Active-Assisted  Extend your right / left elbow with your fingers pointing down.*  Gently pull the back of your hand towards you until you feel a gentle stretch on the top of your forearm.  Hold this position for __________ seconds. Repeat __________ times. Complete this exercise __________ times per day.  *If  directed by your physician, physical therapist or athletic trainer, complete this stretch with your elbow bent rather than extended. RANGE OF MOTION - Wrist Extension, Active-Assisted  Extend your right / left elbow and turn your palm upwards.*  Gently pull your palm/fingertips back so your wrist extends and your fingers point more toward the ground.  You should feel a gentle stretch on the inside of your forearm.  Hold this position for __________ seconds. Repeat __________ times. Complete this exercise __________ times per day. *If directed by your physician, physical therapist or athletic trainer, complete this stretch with your elbow bent, rather than extended. RANGE OF MOTION - Supination, Active  Stand or sit with your elbows at your side. Bend your right / left elbow to 90 degrees.  Turn your palm upward until you feel a gentle stretch on the inside of your forearm.  Hold this position for __________ seconds. Slowly release and return to the starting position. Repeat __________ times. Complete this stretch __________ times per day.  RANGE OF MOTION - Pronation, Active  Stand or sit with your elbows at your side. Bend your right / left elbow to 90 degrees.  Turn your palm downward until you feel a gentle stretch on the top of your forearm.  Hold this position for __________ seconds. Slowly release and return  to the starting position. Repeat __________ times. Complete this stretch __________ times per day.  STRETCH - Wrist Flexion  Place the back of your right / left hand on a tabletop leaving your elbow slightly bent. Your fingers should point away from your body.  Gently press the back of your hand down onto the table by straightening your elbow. You should feel a stretch on the top of your forearm.  Hold this position for __________ seconds. Repeat __________ times. Complete this stretch __________ times per day.  STRETCH - Wrist Extension  Place your right / left  fingertips on a tabletop leaving your elbow slightly bent. Your fingers should point backwards.  Gently press your fingers and palm down onto the table by straightening your elbow. You should feel a stretch on the inside of your forearm.  Hold this position for __________ seconds. Repeat __________ times. Complete this stretch __________ times per day.  STRENGTHENING EXERCISES - Wrist Sprain These exercises may help you when beginning to rehabilitate your injury. They may resolve your symptoms with or without further involvement from your physician, physical therapist or athletic trainer. While completing these exercises, remember:   Muscles can gain both the endurance and the strength needed for everyday activities through controlled exercises.  Complete these exercises as instructed by your physician, physical therapist or athletic trainer. Progress with the resistance and repetition exercises only as your caregiver advises. STRENGTH - Wrist Flexors  Sit with your right / left forearm palm-up and fully supported. Your elbow should be resting below the height of your shoulder. Allow your wrist to extend over the edge of the surface.  Loosely holding a __________ weight or a piece of rubber exercise band/tubing, slowly curl your hand up toward your forearm.  Hold this position for __________ seconds. Slowly lower the wrist back to the starting position in a controlled manner. Repeat __________ times. Complete this exercise __________ times per day.  STRENGTH - Wrist Extensors  Sit with your right / left forearm palm-down and fully supported. Your elbow should be resting below the height of your shoulder. Allow your wrist to extend over the edge of the surface.  Loosely holding a __________ weight or a piece of rubber exercise band/tubing, slowly curl your hand up toward your forearm.  Hold this position for __________ seconds. Slowly lower the wrist back to the starting position in a  controlled manner. Repeat __________ times. Complete this exercise __________ times per day.  STRENGTH - Ulnar Deviators  Stand with a ____________________ weight in your right / left hand, or sit holding on to the rubber exercise band/tubing with your opposite arm supported.  Move your wrist so that your pinkie travels toward your forearm and your thumb moves away from your forearm.  Hold this position for __________ seconds and then slowly lower the wrist back to the starting position. Repeat __________ times. Complete this exercise __________ times per day STRENGTH - Radial Deviators  Stand with a ____________________ weight in your  right / left hand, or sit holding on to the rubber exercise band/tubing with your arm supported.  Raise your hand upward in front of you or pull up on the rubber tubing.  Hold this position for __________ seconds and then slowly lower the wrist back to the starting position. Repeat __________ times. Complete this exercise __________ times per day. STRENGTH - Forearm Supinators  Sit with your right / left forearm supported on a table, keeping your elbow below shoulder height. Rest your hand  over the edge, palm down.  Gently grip a hammer or a soup ladle.  Without moving your elbow, slowly turn your palm and hand upward to a "thumbs-up" position.  Hold this position for __________ seconds. Slowly return to the starting position. Repeat __________ times. Complete this exercise __________ times per day.  STRENGTH - Forearm Pronators  Sit with your right / left forearm supported on a table, keeping your elbow below shoulder height. Rest your hand over the edge, palm up.  Gently grip a hammer or a soup ladle.  Without moving your elbow, slowly turn your palm and hand upward to a "thumbs-up" position.  Hold this position for __________ seconds. Slowly return to the starting position. Repeat __________ times. Complete this exercise __________ times per  day.  STRENGTH - Grip  Grasp a tennis ball, a dense sponge, or a large, rolled sock in your hand.  Squeeze as hard as you can without increasing any pain.  Hold this position for __________ seconds. Release your grip slowly. Repeat __________ times. Complete this exercise __________ times per day.    This information is not intended to replace advice given to you by your health care provider. Make sure you discuss any questions you have with your health care provider.   Document Released: 04/23/2005 Document Revised: 01/12/2015 Document Reviewed: 08/05/2008 Elsevier Interactive Patient Education [employer].

## 2015-06-01 NOTE — Progress Notes (Addendum)
Subjective:  This chart was scribed for Delman Cheadle MD, by Tamsen Roers, at [facility].  This patient was seen in room 4 and the patient's care was started at 11:07 AM.    Patient ID: Wendy Carr, female    DOB: Mar 29, 1956, 60 y.o.   MRN: [id number] Chief Complaint  Patient presents with  . Follow-up    for right wrist pain    HPI HPI Comments: Wendy Carr is a 60 y.o. female who presents to the [facility] for a follow up.  Patient saw Dr. Nyoka Cowden 1 week prior after diffuse musculoskeletal pain after a fall.  Her wrist x ray showed no acute injury but mild arthritis in second and third MCPs.  She was placed in a wrist splint for one week and reccommended follow up if continued to have pain.    Patient notes that she is still having pain over the ulnar aspect of the proximal palm. .  She states that she stopped using the splint as it was hurting her so she only uses it on lunch break at work or in the evenings.  She states that she is unable to turn her ignition properly or make a wrist after the injury.  The first night she slept in the splint, she notes that she woke up with a swollen hand and thinks she may have been sleeping on it.  She states that she is willing to wear the splint at night time to avoid further injury. Denies numbness/tingling.    She is also complaining of a knot on the left side of her back.  She feels that this area is more inflamed and has pain when she coughs in this area after the accident.   Patient is a Medical sales representative at [employer].     Pt is on tramadol 50mg  tid for chronic pain at baseline.  She has not seen Dr. Asa Lente in > a year as has been hard to get into so is considering transfering her care here.  Has not had labs in > 1 yr. Pt does have a h/o opiod abuse so informed her that she will need to be seen for ANY refills of pain medication - if she does decide to transfer her care here, pt  informed that even for refills of her medication she would need to be seen and discuss that with her PCP - would require review of the [place] substance database and UDS - pt understands and is agreeable.  Patient Active Problem List   Diagnosis Date Noted  . Cough 10/11/2014  . Arthritis 03/30/2014  . Tooth pain 03/30/2014  . Opiate withdrawal ([place]) 12/16/2011  . Cocaine abuse 12/16/2011  . BACK PAIN 09/03/2008  . HYPERGLYCEMIA, BORDERLINE 09/03/2008  . ANEMIA-NOS 08/20/2008  . Anxiety state 08/20/2008  . Essential hypertension 08/20/2008  . ALLERGIC RHINITIS 08/20/2008  . Asthma 08/20/2008  . GERD 08/20/2008   Past Medical History  Diagnosis Date  . GERD   . HYPERTENSION   . ALLERGIC RHINITIS   . ANEMIA-NOS   . ANXIETY   . ASTHMA   . HYPOTHYROIDISM   . HYPERGLYCEMIA, BORDERLINE   . Personal history of colonic polyps 2011    last colonscopy 2011: no polyps.   Past Surgical History  Procedure Laterality Date  . Myomectomy  1997  . Breast surgery  2004    Lumpectomy, right side-Benign  . Thyroidectomy, partial  2004    Left,  benign  . Vein surgery  1997    left arm  . Foot surgery  2006    right  . Hernia repair  1995    [facility] ?[place]   Allergies  Allergen Reactions  . Naproxen Other (See Comments)    Stomach cramps    Prior to Admission medications   Medication Sig Start Date End Date Taking? Authorizing Provider  albuterol (VENTOLIN HFA) 108 (90 BASE) MCG/ACT inhaler INHALE 2 PUFFS BY MOUTH 4 TIMES DAILY AS NEEDED FOR SHORTNESS OF BREATH 05/31/14  Yes Thao P Le, DO  ALPRAZolam (XANAX) 0.5 MG tablet TAKE 1 TABLET BY MOUTH TWICE DAILY AS NEEDED FOR SLEEP OR ANXIETY 01/13/15  Yes Golden Circle, FNP  beclomethasone (QVAR) 40 MCG/ACT inhaler Inhale 2 puffs into the lungs 2 (two) times daily. 05/31/14  Yes Thao P Le, DO  benzonatate (TESSALON) 100 MG capsule Take 2 capsules (200 mg total) by mouth 2 (two) times daily as needed. 05/25/15  Yes [name], MD    busPIRone (BUSPAR) 10 MG tablet Take 2 tablets (20 mg total) by mouth 2 (two) times daily. 03/12/15  Yes Rowe Clack, MD  citalopram (CELEXA) 40 MG tablet TAKE 1 TABLET BY MOUTH ONCE DAILY 08/13/14  Yes Rowe Clack, MD  doxycycline (VIBRA-TABS) 100 MG tablet Take 1 tablet (100 mg total) by mouth 2 (two) times daily. 05/25/15  Yes [name], MD  HYDROcodone-homatropine [facility]) 5-1.5 MG/5ML syrup 74m by mouth a bedtime as needed for cough. 05/25/15  Yes [name], MD  ipratropium-albuterol (DUONEB) 0.5-2.5 (3) MG/3ML SOLN Take 3 mLs by nebulization every 6 (six) hours as needed. 05/31/14  Yes Thao P Le, DO  lisinopril-hydrochlorothiazide (PRINZIDE,ZESTORETIC) 20-25 MG tablet TAKE 1 TABLET BY MOUTH DAILY. 03/11/15  Yes Rowe Clack, MD  traMADol (ULTRAM) 50 MG tablet TAKE 1 TABLET BY MOUTH EVERY 6 HOURS AS NEEDED 01/13/15  Yes Golden Circle, FNP  traZODone (DESYREL) 50 MG tablet TAKE 1 TABLET BY MOUTH ONCE DAILY AT BEDTIME 11/16/14  Yes Golden Circle, FNP   Social History   Social History  . Marital Status: Married    Spouse Name: N/A  . Number of Children: N/A  . Years of Education: N/A   Occupational History  . Not on file.   Social History Main Topics  . Smoking status: Current Every Day Smoker -- 0.40 packs/day for 2 years    Types: Cigarettes  . Smokeless tobacco: Not on file     Comment: Married 2009-lives with spouse, no kids  . Alcohol Use: No  . Drug Use: Yes    Special: Cocaine     Comment: Herion  . Sexual Activity: No   Other Topics Concern  . Not on file   Social History Narrative       Review of Systems  Constitutional: Negative for fever and chills.  Eyes: Negative for pain, redness and itching.  Respiratory: Negative for cough, choking and shortness of breath.   Gastrointestinal: Negative for nausea and vomiting.  Musculoskeletal: Positive for myalgias. Negative for neck pain and neck stiffness.  Neurological: Negative for  syncope, speech difficulty and numbness.       Objective:   Physical Exam  Constitutional: She is oriented to person, place, and time. She appears well-developed and well-nourished. No distress.  HENT:  Head: Normocephalic and atraumatic.  Eyes: Pupils are equal, round, and reactive to light.  Cardiovascular: Normal rate, regular rhythm and normal heart sounds.  Exam reveals no friction rub.   No murmur heard. Pulmonary/Chest: Effort normal and breath sounds normal. No respiratory distress. She has no wheezes.     Abdominal:  Normal bowel sounds.   Musculoskeletal:  No tenderness along the radius or ulna, no tenderness with squeeze.  No tenderness along the fifth metacarpal, but sever tenderness along the fourth and third. Not along the first and second. Inability to fully flex MCP's, DIP's and PIP's are normal and can extend all the way.  Normal pronation and supination. Increased pain over ulnar aspect of wrist with full supination.  Full flexion and extension.   Wrist strength is 5/5.  Severe pain with stressing of the ulnar collateral ligaments.  Negative tinel's but tenderness to palpation of the right anaculum. First and fifth opposition are 5/5.    No tenderness to palpation over the lower thoracic or upper lumbar spine.  Tenderness over the lower anterior ribs on the left. A little thickening of the subcutanous tissue, acutely tender to palpation.    Neurological: She is alert and oriented to person, place, and time.  Skin: Skin is warm and dry.    Filed Vitals:   06/01/15 1055  BP: 128/80  Pulse: 89  Temp: 97.8 F (36.6 C)  TempSrc: Oral  Resp: 18  Height: 5\' 3"  (1.6 m)  Weight: 222 lb 3.2 oz (100.789 kg)  SpO2: 97%    Dg Chest 2 View  05/25/2015  CLINICAL DATA:  Cough.  Asthma.  Fall. EXAM: CHEST  2 VIEW COMPARISON:  05/31/2014 chest radiograph. FINDINGS: Stable cardiomediastinal silhouette with normal heart size. No pneumothorax. No pleural effusion. Lungs appear  clear, with no acute consolidative airspace disease and no pulmonary edema. IMPRESSION: No active cardiopulmonary disease. Electronically Signed   By: Ilona Sorrel M.D.   On: 05/25/2015 11:15   Dg Ribs Unilateral W/chest Left  06/01/2015  CLINICAL DATA:  Rib pain of unknown origin. EXAM: LEFT RIBS AND CHEST - 3+ VIEW COMPARISON:  Chest x-ray 05/25/2015 FINDINGS: No fracture or other bone lesions are seen involving the ribs. There is no evidence of pneumothorax or pleural effusion. Both lungs are clear. Heart size and mediastinal contours are within normal limits. IMPRESSION: Negative. Electronically Signed   By: Rolm Baptise M.D.   On: 06/01/2015 11:48   Dg Forearm Right  05/25/2015  CLINICAL DATA:  Acute right forearm pain after fall last week. Initial encounter. EXAM: RIGHT FOREARM - 2 VIEW COMPARISON:  None. FINDINGS: There is no evidence of fracture or other focal bone lesions. Soft tissues are unremarkable. IMPRESSION: Normal right forearm. Electronically Signed   By: Marijo Conception, M.D.   On: 05/25/2015 11:17   Dg Wrist Complete Right  05/25/2015  CLINICAL DATA:  Right wrist pain after recent fall EXAM: RIGHT WRIST - COMPLETE 3+ VIEW COMPARISON:  None. FINDINGS: No fracture, dislocation or suspicious focal osseous lesion. Mild osteoarthritis incidentally noted in the second and third metacarpophalangeal joints. IMPRESSION: 1. No fracture or dislocation in the right wrist. 2. 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 Electronically Signed   By: Ilona Sorrel M.D.   On: 05/25/2015 11:17   Dg Knee Complete 4 Views Left  05/25/2015  CLINICAL DATA:  Fall.  Pain.  Initial evaluation . EXAM: LEFT KNEE - COMPLETE 4+ VIEW COMPARISON:  No recent prior. FINDINGS: There is no evidence of fracture, dislocation, or joint effusion. There is no evidence of arthropathy or other focal bone abnormality. Soft tissues  are unremarkable. IMPRESSION: No acute abnormality. Electronically Signed    By: Marcello Moores  Register   On: 05/25/2015 11:17       Assessment & Plan:   1. Wrist pain, acute, right   2. Right wrist sprain, subsequent encounter - Pt has been worsening for over 2 wks since her injury so proceed with MRI and hand surg referral.  Retry wrist splint o/n as prior morning edema likely due to sleeping on arm/hand.  Cont gentle ROM exercises sev time/d - see AVS. No lifting > 10 lbs.  3. Rib pain on left side  - likely contusion due to fall last wk, xray reassuring, cont symptomatic care - RTC if additional chest or GI sxs dev or pain not resolve in next sev wks.  4. Abdominal pain, left upper quadrant   5. Medication management   NO REFILLS ON ANY CONTROLLED MEDICATION WITHOUT OV - pt has h/o opiate addiction.  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  Orders Placed This Encounter  Procedures  . DG Ribs Unilateral W/Chest Left    Standing Status: Future     Number of Occurrences: 1     Standing Expiration Date: 05/31/2016    Order Specific Question:  Reason for Exam (SYMPTOM  OR DIAGNOSIS REQUIRED)    Answer:  severely tender to palpation over anterior aspect of lower 11-12th rib    Order Specific Question:  Is the patient pregnant?    Answer:  No    Order Specific Question:  Preferred imaging location?    Answer:  External  . MR Wrist Right Wo Contrast    Standing Status: Future     Number of Occurrences:      Standing Expiration Date: 07/29/2016    Order Specific Question:  Reason for Exam (SYMPTOM  OR DIAGNOSIS REQUIRED)    Answer:  [place] around 1/9 with worsening pain and weakness, falled conservative mngmnt with RICE, bracing    Order Specific Question:  Preferred imaging location?    Answer:  [facility]    Order Specific Question:  Does the patient have a pacemaker or implanted devices?    Answer:  No     Order Specific Question:  What is the patient's sedation requirement?    Answer:  No Sedation  . Comprehensive metabolic panel  . Ambulatory referral to Hand Surgery    Referral Priority:  Routine    Referral Type:  Surgical    Referral Reason:  Specialty Services Required    Requested Specialty:  Hand Surgery    Number of Visits Requested:  1    Meds ordered this encounter  Medications  . HYDROcodone-acetaminophen (NORCO/VICODIN) 5-325 MG tablet    Sig: Take 1 tablet by mouth every 6 (six) hours as needed for moderate pain.    Dispense:  30 tablet    Refill:  0    I personally performed the services described in this documentation, which was scribed in my presence. The recorded information has been reviewed and considered, and addended by me as needed.  Delman Cheadle, MD MPH

## 2015-06-02 ENCOUNTER — Telehealth: Payer: Self-pay | Admitting: Family Medicine

## 2015-06-02 NOTE — Telephone Encounter (Signed)
Patient dropped off accidental injury claim form. It has been completed, signed, and scanned. The patient has been notified.

## 2015-06-03 ENCOUNTER — Encounter: Payer: Self-pay | Admitting: Family Medicine

## 2015-06-05 ENCOUNTER — Ambulatory Visit (INDEPENDENT_AMBULATORY_CARE_PROVIDER_SITE_OTHER): Payer: 59 | Admitting: Family Medicine

## 2015-06-05 VITALS — BP 128/74 | HR 66 | Temp 98.1°F | Resp 16 | Ht 63.0 in | Wt 220.0 lb

## 2015-06-05 DIAGNOSIS — F40240 Claustrophobia: Secondary | ICD-10-CM

## 2015-06-05 DIAGNOSIS — S66911D Strain of unspecified muscle, fascia and tendon at wrist and hand level, right hand, subsequent encounter: Secondary | ICD-10-CM

## 2015-06-05 DIAGNOSIS — S20212D Contusion of left front wall of thorax, subsequent encounter: Secondary | ICD-10-CM | POA: Diagnosis not present

## 2015-06-05 DIAGNOSIS — S8002XD Contusion of left knee, subsequent encounter: Secondary | ICD-10-CM

## 2015-06-05 DIAGNOSIS — Z23 Encounter for immunization: Secondary | ICD-10-CM

## 2015-06-05 MED ORDER — DIAZEPAM 2 MG PO TABS: ORAL_TABLET | ORAL | Status: DC

## 2015-06-05 NOTE — Progress Notes (Signed)
Subjective:    Patient ID: Wendy Carr, female    DOB: January 21, 1956, 60 y.o.   MRN: [id number]  06/05/2015  Knee Pain; Wrist Pain; Chest Pain; and Flu Vaccine   HPI This 60 y.o. female presents for four day follow-up and evaluation for the following:   1. R wrist strain: scheduled for MRI this week per Dr. Brigitte Pulse; please see previous note. Claustophic; requesting benzo. Takes Xanax 0.5mg  bid but would like Valium for MRI.  Wrist Still causing problems/pain; wearing wrist splint at night and during the day when not doing activity.  Hotel manager; pre-K.  Manager understands. Unable to close hand all the way.  R hand dominant.    2.  L rib contusion: improved from last visit four days ago. No cough or SOB.  Less painful range of motion; slipped on ice/snowstorm.  Landed on ribs.    3.  L knee contusion: much better.  No further swelling or pain. No popping/giving out/swelling.  Ready to return to work.  4.  Requesting flu vaccine.   Review of Systems  Constitutional: Negative for fever, chills, diaphoresis and fatigue.  Respiratory: Negative for cough, shortness of breath, wheezing and stridor.   Cardiovascular: Negative for chest pain, palpitations and leg swelling.  Musculoskeletal: Positive for myalgias and arthralgias. Negative for gait problem, neck pain and neck stiffness.  Skin: Negative for rash and wound.  Neurological: Negative for weakness and numbness.    Past Medical History  Diagnosis Date  . GERD   . HYPERTENSION   . ALLERGIC RHINITIS   . ANEMIA-NOS   . ANXIETY   . ASTHMA   . HYPOTHYROIDISM   . HYPERGLYCEMIA, BORDERLINE   . Personal history of colonic polyps 2011    last colonscopy 2011: no polyps.   Past Surgical History  Procedure Laterality Date  . Myomectomy  1997  . Breast surgery  2004    Lumpectomy, right side-Benign  . Thyroidectomy, partial  2004    Left, benign  . Vein surgery  1997    left arm  . Foot surgery  2006    right  . Hernia  repair  1995    [facility] ?[place]   Allergies  Allergen Reactions  . Naproxen Other (See Comments)    Stomach cramps     Social History   Social History  . Marital Status: Married    Spouse Name: N/A  . Number of Children: N/A  . Years of Education: N/A   Occupational History  . Not on file.   Social History Main Topics  . Smoking status: Current Every Day Smoker -- 0.40 packs/day for 2 years    Types: Cigarettes  . Smokeless tobacco: Not on file     Comment: Married 2009-lives with spouse, no kids  . Alcohol Use: No  . Drug Use: Yes    Special: Cocaine     Comment: Herion  . Sexual Activity: No   Other Topics Concern  . Not on file   Social History Narrative   Family History  Problem Relation Age of Onset  . Hypertension Mother   . Osteoarthritis Mother   . Diabetes Sister   . Arthritis Other   . Pancreatic cancer Other   . Diabetes Other        Objective:    BP 128/74 mmHg  Pulse 66  Temp(Src) 98.1 F (36.7 C)  Resp 16  Ht 5\' 3"  (1.6 m)  Wt 220 lb (99.791 kg)  BMI 38.98 kg/m2  SpO2 98% Physical Exam  Constitutional: She is oriented to person, place, and time. She appears well-developed and well-nourished. No distress.  HENT:  Head: Normocephalic and atraumatic.  Eyes: Conjunctivae are normal. Pupils are equal, round, and reactive to light.  Neck: Normal range of motion. Neck supple.  Cardiovascular: Normal rate, regular rhythm and normal heart sounds.  Exam reveals no gallop and no friction rub.   No murmur heard. Pulmonary/Chest: Effort normal and breath sounds normal. She has no wheezes. She has no rales. She exhibits no tenderness.  Musculoskeletal:       Right elbow: Normal.She exhibits normal range of motion.       Right wrist: She exhibits decreased range of motion, tenderness and bony tenderness. She exhibits no swelling and no effusion.       Cervical back: Normal. She exhibits normal range of motion, no tenderness and no bony tenderness.        Thoracic back: Normal. She exhibits normal range of motion, no tenderness and no bony tenderness.       Lumbar back: Normal. She exhibits normal range of motion, no tenderness and no bony tenderness.       Right forearm: Normal. She exhibits no tenderness, no bony tenderness and no swelling.       Right hand: She exhibits decreased range of motion, tenderness and bony tenderness. She exhibits normal capillary refill, no deformity, no laceration and no swelling. Normal sensation noted. Decreased strength noted.  Neurological: She is alert and oriented to person, place, and time.  Skin: Skin is warm and dry. No rash noted. She is not diaphoretic.  +lipomatous 1 cm diameter lesion along L chest wall.  Psychiatric: She has a normal mood and affect. Her behavior is normal.  Nursing note and vitals reviewed.  Results for orders placed or performed in visit on 06/01/15  Comprehensive metabolic panel  Result Value Ref Range   Sodium 138 135 - 146 mmol/L   Potassium 4.1 3.5 - 5.3 mmol/L   Chloride 103 98 - 110 mmol/L   CO2 27 20 - 31 mmol/L   Glucose, Bld 104 (H) 65 - 99 mg/dL   BUN 18 7 - 25 mg/dL   Creat 0.74 0.50 - 1.05 mg/dL   Total Bilirubin 0.5 0.2 - 1.2 mg/dL   Alkaline Phosphatase 105 33 - 130 U/L   AST 37 (H) 10 - 35 U/L   ALT 31 (H) 6 - 29 U/L   Total Protein 7.6 6.1 - 8.1 g/dL   Albumin 3.8 3.6 - 5.1 g/dL   Calcium 9.6 8.6 - 10.4 mg/dL       Assessment & Plan:   1. Wrist strain, right, subsequent encounter   2. Rib contusion, left, subsequent encounter   3. Knee contusion, left, subsequent encounter   4. Needs flu shot     1. Wrist strain R subsequent: persistent; scheduled for MRI this week.  Continue supportive care with wrist splint.  Light duty. 2.  L rib contusion: improved/resolved; no limitation to activity. 3.  L knee contusion: improved/resolved. No limitation to activity. 4.  Claustrophobia:  New. Rx for Valium provided to take prior to MRI. 5.  S/p  influenza vaccine.   Orders Placed This Encounter  Procedures  . Flu Vaccine QUAD 36+ mos IM (Fluarix & Fluzone Quad PF   Meds ordered this encounter  Medications  . diazepam (VALIUM) 2 MG tablet    Sig: Take one prior to procedure; may repeat if needed.  Dispense:  2 tablet    Refill:  0    No Follow-up on file.    Israel Wunder Elayne Guerin, M.D. [facility] [address] [place], [place]  [address] [phone] phone [phone] fax

## 2015-06-06 MED FILL — diazePAM 2 MG TABS: 2 | 1 days supply | Qty: 2 | Fill #0

## 2015-06-07 ENCOUNTER — Ambulatory Visit
Admission: RE | Admit: 2015-06-07 | Discharge: 2015-06-07 | Disposition: A | Discharge status: Home / Self Care | Payer: 59 | Source: Ambulatory Visit | Attending: Family Medicine | Admitting: Family Medicine

## 2015-06-07 ENCOUNTER — Telehealth: Payer: Self-pay

## 2015-06-07 DIAGNOSIS — M25531 Pain in right wrist: Secondary | ICD-10-CM

## 2015-06-07 DIAGNOSIS — S63501D Unspecified sprain of right wrist, subsequent encounter: Secondary | ICD-10-CM

## 2015-06-07 NOTE — Telephone Encounter (Signed)
LVM for pt to call back as soon as possible.   RE: pt needs an appt with new pcp before rx for linsinopril can be sent in.

## 2015-06-08 ENCOUNTER — Other Ambulatory Visit: Payer: Self-pay | Admitting: Family Medicine

## 2015-06-08 MED ORDER — HYDROCODONE-ACETAMINOPHEN 5-325 MG PO TABS: 1.0000 | ORAL_TABLET | Freq: Four times a day (QID) | ORAL | Status: DC | PRN

## 2015-06-09 ENCOUNTER — Telehealth: Payer: Self-pay | Admitting: Family

## 2015-06-09 MED ORDER — LISINOPRIL-HYDROCHLOROTHIAZIDE 20-25 MG PO TABS: 1.0000 | ORAL_TABLET | Freq: Every day | ORAL | Status: DC

## 2015-06-09 MED FILL — ALPRAZolam 0.5 MG TABS: 0.5 | 30 days supply | Qty: 60 | Fill #5

## 2015-06-09 MED FILL — HYDROCODON-APAP 5-325: 5-325 | 8 days supply | Qty: 30 | Fill #0

## 2015-06-09 MED FILL — LISINOPRIL-HCTZ 20-25 MG TA: 20-25 | 30 days supply | Qty: 30 | Fill #0

## 2015-06-09 NOTE — Telephone Encounter (Signed)
Rx sent pt is aware 

## 2015-06-09 NOTE — Telephone Encounter (Signed)
Pt called state she is out of this med, she has an appt with Marya Amsler on 05/20/15. Please help, she can not be without this med.

## 2015-06-09 NOTE — Telephone Encounter (Signed)
Patient is needing lisinopril to be sent [facility].

## 2015-06-13 DIAGNOSIS — S63591A Other specified sprain of right wrist, initial encounter: Secondary | ICD-10-CM | POA: Diagnosis not present

## 2015-06-13 MED FILL — MELOXICAM 15 MG TABLET: 15 | 30 days supply | Qty: 30 | Fill #0

## 2015-06-14 MED FILL — HYDROCODON-APAP 10-325: 10-325 | 20 days supply | Qty: 60 | Fill #0

## 2015-06-20 ENCOUNTER — Encounter: Payer: Self-pay | Admitting: Family

## 2015-07-04 DIAGNOSIS — S63591D Other specified sprain of right wrist, subsequent encounter: Secondary | ICD-10-CM | POA: Diagnosis not present

## 2015-07-04 MED FILL — HYDROCODON-APAP 10-325: 10-325 | 20 days supply | Qty: 60 | Fill #0

## 2015-07-04 MED FILL — traZODone HCL 50 MG TABS: 50 | 30 days supply | Qty: 30 | Fill #5

## 2015-07-05 ENCOUNTER — Encounter: Payer: Self-pay | Admitting: Family

## 2015-07-07 ENCOUNTER — Encounter: Payer: Self-pay | Admitting: Internal Medicine

## 2015-07-07 ENCOUNTER — Ambulatory Visit (INDEPENDENT_AMBULATORY_CARE_PROVIDER_SITE_OTHER): Payer: 59 | Admitting: Family

## 2015-07-07 ENCOUNTER — Other Ambulatory Visit: Payer: Self-pay | Admitting: Family

## 2015-07-07 ENCOUNTER — Encounter: Payer: Self-pay | Admitting: Family

## 2015-07-07 VITALS — BP 134/70 | HR 72 | Temp 97.9°F | Resp 16 | Ht 63.0 in | Wt 216.8 lb

## 2015-07-07 DIAGNOSIS — F411 Generalized anxiety disorder: Secondary | ICD-10-CM

## 2015-07-07 DIAGNOSIS — G479 Sleep disorder, unspecified: Secondary | ICD-10-CM | POA: Insufficient documentation

## 2015-07-07 DIAGNOSIS — Z0000 Encounter for general adult medical examination without abnormal findings: Secondary | ICD-10-CM | POA: Diagnosis not present

## 2015-07-07 DIAGNOSIS — I10 Essential (primary) hypertension: Secondary | ICD-10-CM

## 2015-07-07 DIAGNOSIS — M545 Low back pain, unspecified: Secondary | ICD-10-CM

## 2015-07-07 DIAGNOSIS — Z1231 Encounter for screening mammogram for malignant neoplasm of breast: Secondary | ICD-10-CM

## 2015-07-07 MED ORDER — ALPRAZOLAM 0.5 MG PO TABS: ORAL_TABLET | ORAL | Status: DC

## 2015-07-07 MED ORDER — BUSPIRONE HCL 10 MG PO TABS: 20.0000 mg | ORAL_TABLET | Freq: Two times a day (BID) | ORAL | Status: DC

## 2015-07-07 MED ORDER — LISINOPRIL-HYDROCHLOROTHIAZIDE 20-25 MG PO TABS
1.0000 | ORAL_TABLET | Freq: Every day | ORAL | Status: DC
Start: 2015-07-07 — End: 2016-01-02

## 2015-07-07 MED ORDER — CITALOPRAM HYDROBROMIDE 40 MG PO TABS: 40.0000 mg | ORAL_TABLET | Freq: Every day | ORAL | Status: DC

## 2015-07-07 MED ORDER — TRAZODONE HCL 50 MG PO TABS: 50.0000 mg | ORAL_TABLET | Freq: Every day | ORAL | Status: DC

## 2015-07-07 MED ORDER — TRAMADOL HCL 50 MG PO TABS: 50.0000 mg | ORAL_TABLET | Freq: Four times a day (QID) | ORAL | Status: DC | PRN

## 2015-07-07 MED FILL — ALPRAZolam 0.5 MG TABS: 0.5 | 30 days supply | Qty: 60 | Fill #0

## 2015-07-07 MED FILL — CITALOPRAM HBR 40 MG TABLET: 40 | 30 days supply | Qty: 30 | Fill #0

## 2015-07-07 MED FILL — LISINOPRIL-HCTZ 20-25 MG TA: 20-25 | 90 days supply | Qty: 90 | Fill #0

## 2015-07-07 MED FILL — traMADol HCL 50 MG TABS: 50 | 15 days supply | Qty: 60 | Fill #0

## 2015-07-07 NOTE — Patient Instructions (Signed)
Thank you for choosing [facility].  Summary/Instructions:  Your prescription(s) have been submitted to your pharmacy or been printed and provided for you. Please take as directed and contact our office if you believe you are having problem(s) with the medication(s) or have any questions.  Please stop by the lab on the basement level of the building for your blood work. Your results will be released to MyChart (or called to you) after review, usually within 72 hours after test completion. If any changes need to be made, you will be notified at that same time.  Health Maintenance, Female Adopting a healthy lifestyle and getting preventive care can go a long way to promote health and wellness. Talk with your health care provider about what schedule of regular examinations is right for you. This is a good chance for you to check in with your provider about disease prevention and staying healthy. In between checkups, there are plenty of things you can do on your own. Experts have done a lot of research about which lifestyle changes and preventive measures are most likely to keep you healthy. Ask your health care provider for more information. WEIGHT AND DIET  Eat a healthy diet  Be sure to include plenty of vegetables, fruits, low-fat dairy products, and lean protein.  Do not eat a lot of foods high in solid fats, added sugars, or salt.  Get regular exercise. This is one of the most important things you can do for your health.  Most adults should exercise for at least 150 minutes each week. The exercise should increase your heart rate and make you sweat (moderate-intensity exercise).  Most adults should also do strengthening exercises at least twice a week. This is in addition to the moderate-intensity exercise.  Maintain a healthy weight  Body mass index (BMI) is a measurement that can be used to identify possible weight problems. It estimates body fat based on height and weight. Your  health care provider can help determine your BMI and help you achieve or maintain a healthy weight.  For females 20 years of age and older:   A BMI below 18.5 is considered underweight.  A BMI of 18.5 to 24.9 is normal.  A BMI of 25 to 29.9 is considered overweight.  A BMI of 30 and above is considered obese.  Watch levels of cholesterol and blood lipids  You should start having your blood tested for lipids and cholesterol at 58 years of age, then have this test every 5 years.  You may need to have your cholesterol levels checked more often if:  Your lipid or cholesterol levels are high.  You are older than 58 years of age.  You are at high risk for heart disease.  CANCER SCREENING   Lung Cancer  Lung cancer screening is recommended for adults 55-80 years old who are at high risk for lung cancer because of a history of smoking.  A yearly low-dose CT scan of the lungs is recommended for people who:  Currently smoke.  Have quit within the past 15 years.  Have at least a 30-pack-year history of smoking. A pack year is smoking an average of one pack of cigarettes a day for 1 year.  Yearly screening should continue until it has been 15 years since you quit.  Yearly screening should stop if you develop a health problem that would prevent you from having lung cancer treatment.  Breast Cancer  Practice breast self-awareness. This means understanding how your breasts normally   appear and feel.  It also means doing regular breast self-exams. Let your health care provider know about any changes, no matter how small.  If you are in your 20s or 30s, you should have a clinical breast exam (CBE) by a health care provider every 1-3 years as part of a regular health exam.  If you are 40 or older, have a CBE every year. Also consider having a breast X-ray (mammogram) every year.  If you have a family history of breast cancer, talk to your health care provider about genetic  screening.  If you are at high risk for breast cancer, talk to your health care provider about having an MRI and a mammogram every year.  Breast cancer gene (BRCA) assessment is recommended for women who have family members with BRCA-related cancers. BRCA-related cancers include:  Breast.  Ovarian.  Tubal.  Peritoneal cancers.  Results of the assessment will determine the need for genetic counseling and BRCA1 and BRCA2 testing. Cervical Cancer Your health care provider may recommend that you be screened regularly for cancer of the pelvic organs (ovaries, uterus, and vagina). This screening involves a pelvic examination, including checking for microscopic changes to the surface of your cervix (Pap test). You may be encouraged to have this screening done every 3 years, beginning at age 21.  For women ages 30-65, health care providers may recommend pelvic exams and Pap testing every 3 years, or they may recommend the Pap and pelvic exam, combined with testing for human papilloma virus (HPV), every 5 years. Some types of HPV increase your risk of cervical cancer. Testing for HPV may also be done on women of any age with unclear Pap test results.  Other health care providers may not recommend any screening for nonpregnant women who are considered low risk for pelvic cancer and who do not have symptoms. Ask your health care provider if a screening pelvic exam is right for you.  If you have had past treatment for cervical cancer or a condition that could lead to cancer, you need Pap tests and screening for cancer for at least 20 years after your treatment. If Pap tests have been discontinued, your risk factors (such as having a new sexual partner) need to be reassessed to determine if screening should resume. Some women have medical problems that increase the chance of getting cervical cancer. In these cases, your health care provider may recommend more frequent screening and Pap tests. Colorectal  Cancer  This type of cancer can be detected and often prevented.  Routine colorectal cancer screening usually begins at 58 years of age and continues through 58 years of age.  Your health care provider may recommend screening at an earlier age if you have risk factors for colon cancer.  Your health care provider may also recommend using home test kits to check for hidden blood in the stool.  A small camera at the end of a tube can be used to examine your colon directly (sigmoidoscopy or colonoscopy). This is done to check for the earliest forms of colorectal cancer.  Routine screening usually begins at age 50.  Direct examination of the colon should be repeated every 5-10 years through 58 years of age. However, you may need to be screened more often if early forms of precancerous polyps or small growths are found. Skin Cancer  Check your skin from head to toe regularly.  Tell your health care provider about any new moles or changes in moles, especially if there is   a change in a mole's shape or color.  Also tell your health care provider if you have a mole that is larger than the size of a pencil eraser.  Always use sunscreen. Apply sunscreen liberally and repeatedly throughout the day.  Protect yourself by wearing long sleeves, pants, a wide-brimmed hat, and sunglasses whenever you are outside. HEART DISEASE, DIABETES, AND HIGH BLOOD PRESSURE   High blood pressure causes heart disease and increases the risk of stroke. High blood pressure is more likely to develop in:  People who have blood pressure in the high end of the normal range (130-139/85-89 mm Hg).  People who are overweight or obese.  People who are African American.  If you are 7-72 years of age, have your blood pressure checked every 3-5 years. If you are 78 years of age or older, have your blood pressure checked every year. You should have your blood pressure measured twice--once when you are at a hospital or clinic,  and once when you are not at a hospital or clinic. Record the average of the two measurements. To check your blood pressure when you are not at a hospital or clinic, you can use:  An automated blood pressure machine at a pharmacy.  A home blood pressure monitor.  If you are between 40 years and 62 years old, ask your health care provider if you should take aspirin to prevent strokes.  Have regular diabetes screenings. This involves taking a blood sample to check your fasting blood sugar level.  If you are at a normal weight and have a low risk for diabetes, have this test once every three years after 58 years of age.  If you are overweight and have a high risk for diabetes, consider being tested at a younger age or more often. PREVENTING INFECTION  Hepatitis B  If you have a higher risk for hepatitis B, you should be screened for this virus. You are considered at high risk for hepatitis B if:  You were born in a country where hepatitis B is common. Ask your health care provider which countries are considered high risk.  Your parents were born in a high-risk country, and you have not been immunized against hepatitis B (hepatitis B vaccine).  You have HIV or AIDS.  You use needles to inject street drugs.  You live with someone who has hepatitis B.  You have had sex with someone who has hepatitis B.  You get hemodialysis treatment.  You take certain medicines for conditions, including cancer, organ transplantation, and autoimmune conditions. Hepatitis C  Blood testing is recommended for:  Everyone born from [birth year] through 1963.  Anyone with known risk factors for hepatitis C. Sexually transmitted infections (STIs)  You should be screened for sexually transmitted infections (STIs) including gonorrhea and chlamydia if:  You are sexually active and are younger than 58 years of age.  You are older than 58 years of age and your health care provider tells you that you are at risk  for this type of infection.  Your sexual activity has changed since you were last screened and you are at an increased risk for chlamydia or gonorrhea. Ask your health care provider if you are at risk.  If you do not have HIV, but are at risk, it may be recommended that you take a prescription medicine daily to prevent HIV infection. This is called pre-exposure prophylaxis (PrEP). You are considered at risk if:  You are sexually active and do not regularly  use condoms or know the HIV status of your partner(s).  You take drugs by injection.  You are sexually active with a partner who has HIV. Talk with your health care provider about whether you are at high risk of being infected with HIV. If you choose to begin PrEP, you should first be tested for HIV. You should then be tested every 3 months for as long as you are taking PrEP.  PREGNANCY   If you are premenopausal and you may become pregnant, ask your health care provider about preconception counseling.  If you may become pregnant, take 400 to 800 micrograms (mcg) of folic acid every day.  If you want to prevent pregnancy, talk to your health care provider about birth control (contraception). OSTEOPOROSIS AND MENOPAUSE   Osteoporosis is a disease in which the bones lose minerals and strength with aging. This can result in serious bone fractures. Your risk for osteoporosis can be identified using a bone density scan.  If you are 27 years of age or older, or if you are at risk for osteoporosis and fractures, ask your health care provider if you should be screened.  Ask your health care provider whether you should take a calcium or vitamin D supplement to lower your risk for osteoporosis.  Menopause may have certain physical symptoms and risks.  Hormone replacement therapy may reduce some of these symptoms and risks. Talk to your health care provider about whether hormone replacement therapy is right for you.  HOME CARE INSTRUCTIONS    Schedule regular health, dental, and eye exams.  Stay current with your immunizations.   Do not use any tobacco products including cigarettes, chewing tobacco, or electronic cigarettes.  If you are pregnant, do not drink alcohol.  If you are breastfeeding, limit how much and how often you drink alcohol.  Limit alcohol intake to no more than 1 drink per day for nonpregnant women. One drink equals 12 ounces of beer, 5 ounces of wine, or 1 ounces of hard liquor.  Do not use street drugs.  Do not share needles.  Ask your health care provider for help if you need support or information about quitting drugs.  Tell your health care provider if you often feel depressed.  Tell your health care provider if you have ever been abused or do not feel safe at home.   This information is not intended to replace advice given to you by your health care provider. Make sure you discuss any questions you have with your health care provider.   Document Released: 11/06/2010 Document Revised: 05/14/2014 Document Reviewed: 03/25/2013 Elsevier Interactive Patient Education [employer].

## 2015-07-07 NOTE — Assessment & Plan Note (Signed)
1) Anticipatory Guidance: Discussed importance of wearing a seatbelt while driving and not texting while driving; changing batteries in smoke detector at least once annually; wearing suntan lotion when outside; eating a balanced and moderate diet; getting physical activity at least 30 minutes per day.  2) Immunizations / Screenings / Labs:  All immunizations are up-to-date per recommendations. Due for dental and vision screening encouraged to be completed independently. Due for well woman exam and Pap smear with referral to gynecology place. Due for mammogram with mammogram order placed. All other screenings are up-to-date per recommendations.Obtain CBC, BMET, Lipid profile and TSH.  Overall well exam with risk factors for cardiovascular disease including hypertension, obesity and substance abuse. Blood pressure is well controlled with current medications. Continue to monitor blood pressure at home. 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 Discussed importance of remaining clean/sober and the risk for advancing cardiovascular disease as well as other health detriments if continued use. Recommend increasing physical activity of 30 minutes of moderate level activity outside of work on a daily basis. Discussed importance of eating a balanced, and varied, and moderate diet that is low in saturated fats and sugars/processed foods and focuses on nutrient dense foods. Recommended weight loss goal approximately 5-10% of current body weight. Continue other healthy lifestyle behaviors and choices. Follow-up prevention exam in 1 year. Follow-up office visit in 6 months or sooner for chronic conditions.

## 2015-07-07 NOTE — Progress Notes (Signed)
Subjective:    Patient ID: Wendy Carr, female    DOB: [birth year], 60 y.o.   MRN: [id number]  Chief Complaint  Patient presents with  . CPE    Medications that need refills are pended, not fasting    HPI:  Wendy Carr is a 60 y.o. female who presents today for an annual wellness visit.   1) Health Maintenance -   Diet - Averages about 3 meals per day consisting of pork, vegetables, fruit, beef, and chicken; 1-2 cups of caffeine daily.   Exercise - At work with pre-K; no structured exercise outside of work.    2) Preventative Exams / Immunizations:  Dental --Due for exam  Vision -- Due for exam   Health Maintenance  Topic Date Due  . HIV Screening  05/27/1972  . PAP SMEAR  05/27/1978  . MAMMOGRAM  07/23/2015  . INFLUENZA VACCINE  12/06/2015  . TETANUS/TDAP  05/07/2016  . COLONOSCOPY  03/27/2020  . Hepatitis C Screening  Completed    Immunization History  Administered Date(s) Administered  . Influenza Split 02/11/2007, 02/07/2011, 03/19/2012  . Influenza Whole 01/13/2010  . Influenza,inj,Quad PF,36+ Mos 02/04/2013, 06/05/2015  . PPD Test 05/07/2011, 05/28/2012, 08/12/2013  . Td 05/07/2006    Allergies  Allergen Reactions  . Naproxen Other (See Comments)    Stomach cramps      Outpatient Prescriptions Prior to Visit  Medication Sig Dispense Refill  . albuterol (VENTOLIN HFA) 108 (90 BASE) MCG/ACT inhaler INHALE 2 PUFFS BY MOUTH 4 TIMES DAILY AS NEEDED FOR SHORTNESS OF BREATH 18 g 11  . beclomethasone (QVAR) 40 MCG/ACT inhaler Inhale 2 puffs into the lungs 2 (two) times daily. 1 Inhaler 11  . HYDROcodone-acetaminophen (NORCO/VICODIN) 5-325 MG tablet Take 1 tablet by mouth every 6 (six) hours as needed for moderate pain. 30 tablet 0  . ipratropium-albuterol (DUONEB) 0.5-2.5 (3) MG/3ML SOLN Take 3 mLs by nebulization every 6 (six) hours as needed. 360 mL 3  . ALPRAZolam (XANAX) 0.5 MG tablet TAKE 1 TABLET BY MOUTH TWICE DAILY AS NEEDED FOR SLEEP OR  ANXIETY 60 tablet PRN  . benzonatate (TESSALON) 100 MG capsule Take 2 capsules (200 mg total) by mouth 2 (two) times daily as needed. 30 capsule 1  . busPIRone (BUSPAR) 10 MG tablet Take 2 tablets (20 mg total) by mouth 2 (two) times daily. 360 tablet 3  . citalopram (CELEXA) 40 MG tablet TAKE 1 TABLET BY MOUTH ONCE DAILY 30 tablet 5  . diazepam (VALIUM) 2 MG tablet Take one prior to procedure; may repeat if needed. 2 tablet 0  . doxycycline (VIBRA-TABS) 100 MG tablet Take 1 tablet (100 mg total) by mouth 2 (two) times daily. 20 tablet 0  . lisinopril-hydrochlorothiazide (PRINZIDE,ZESTORETIC) 20-25 MG tablet Take 1 tablet by mouth daily. 30 tablet 0  . traMADol (ULTRAM) 50 MG tablet TAKE 1 TABLET BY MOUTH EVERY 6 HOURS AS NEEDED 60 tablet PRN  . traZODone (DESYREL) 50 MG tablet TAKE 1 TABLET BY MOUTH ONCE DAILY AT BEDTIME 30 tablet PRN   No facility-administered medications prior to visit.     Past Medical History  Diagnosis Date  . GERD   . HYPERTENSION   . ALLERGIC RHINITIS   . ANEMIA-NOS   . ANXIETY   . ASTHMA   . HYPOTHYROIDISM   . HYPERGLYCEMIA, BORDERLINE   . Personal history of colonic polyps 2011    last colonscopy 2011: no polyps.     Past Surgical History  Procedure Laterality Date  .  Myomectomy  1997  . Breast surgery  2004    Lumpectomy, right side-Benign  . Thyroidectomy, partial  2004    Left, benign  . Vein surgery  1997    left arm  . Foot surgery  2006    right  . Hernia repair  1995    [facility] ?,[place]     Family History  Problem Relation Age of Onset  . Hypertension Mother   . Osteoarthritis Mother   . Diabetes Sister   . Arthritis Other   . Pancreatic cancer Other   . Diabetes Other      Social History   Social History  . Marital Status: Married    Spouse Name: N/A  . Number of Children: 0  . Years of Education: 16   Occupational History  . Not on file.   Social History Main Topics  . Smoking status: Current Every Day Smoker --  0.40 packs/day for 7 years    Types: Cigarettes  . Smokeless tobacco: Never Used     Comment: Married 2009-lives with spouse, no kids  . Alcohol Use: No  . 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 Sexual Activity: No   Other Topics Concern  . Not on file   Social History Narrative   Denies abuse and feels safe at home.      Review of Systems  Constitutional: Denies fever, chills, fatigue, or significant weight gain/loss. HENT: Head: Denies headache or neck pain Ears: Denies changes in hearing, ringing in ears, earache, drainage Nose: Denies discharge, stuffiness, itching, nosebleed, sinus pain Throat: Denies sore throat, hoarseness, dry mouth, sores, thrush Eyes: Denies loss/changes in vision, pain, redness, blurry/double vision, flashing lights Cardiovascular: Denies chest pain/discomfort, tightness, palpitations, shortness of breath with activity, difficulty lying down, swelling, sudden awakening with shortness of breath Respiratory: Denies shortness of breath, cough, sputum production, wheezing Gastrointestinal: Denies dysphasia, heartburn, change in appetite, nausea, change in bowel habits, rectal bleeding, constipation, diarrhea, yellow skin or eyes Genitourinary: Denies frequency, urgency, burning/pain, blood in urine, incontinence, change in urinary strength. Musculoskeletal: Denies muscle/joint pain, stiffness, back pain, redness or swelling of joints, trauma Skin: Denies rashes, lumps, itching, dryness, color changes, or hair/nail changes Neurological: Denies dizziness, fainting, seizures, weakness, numbness, tingling, tremor Psychiatric - Denies nervousness, stress, depression or memory loss Endocrine: Denies heat or cold intolerance, sweating, frequent urination, excessive thirst, changes in appetite Hematologic: Denies ease of bruising or bleeding     Objective:    BP 134/70 mmHg  Pulse 72  Temp(Src)  97.9 F (36.6 C) (Oral)  Resp 16  Ht 5\' 3"  (1.6 m)  Wt 216 lb 12.8 oz (98.34 kg)  BMI 38.41 kg/m2  SpO2 99% Nursing note and vital signs reviewed.  Physical Exam  Constitutional: She is oriented to person, place, and time. She appears well-developed and well-nourished.  HENT:  Head: Normocephalic.  Right Ear: Hearing, tympanic membrane, external ear and ear canal normal.  Left Ear: Hearing, tympanic membrane, external ear and ear canal normal.  Nose: Nose normal.  Mouth/Throat: Uvula is midline, oropharynx is clear and moist and mucous membranes are normal.  Eyes: Conjunctivae and EOM are normal. Pupils are equal, round, and reactive to light.  Neck: Neck supple. No JVD present. No tracheal deviation present. No thyromegaly present.  Cardiovascular: Normal rate, regular rhythm, normal heart sounds and intact distal pulses.   Pulmonary/Chest: Effort normal and  breath sounds normal.  Abdominal: Soft. Bowel sounds are normal. She exhibits no distension and no mass. There is no tenderness. There is no rebound and no guarding.  Musculoskeletal: Normal range of motion. She exhibits no edema or tenderness.  Lymphadenopathy:    She has no cervical adenopathy.  Neurological: She is alert and oriented to person, place, and time. She has normal reflexes. No cranial nerve deficit. She exhibits normal muscle tone. Coordination normal.  Skin: Skin is warm and dry.  Psychiatric: She has a normal mood and affect. Her behavior is normal. Judgment and thought content normal.       Assessment & Plan:   Problem List Items Addressed This Visit      Cardiovascular and Mediastinum   Essential hypertension   Relevant Medications   lisinopril-hydrochlorothiazide (PRINZIDE,ZESTORETIC) 20-25 MG tablet     Other   Anxiety state   Relevant Medications   ALPRAZolam (XANAX) 0.5 MG tablet   citalopram (CELEXA) 40 MG tablet   busPIRone (BUSPAR) 10 MG tablet   traZODone (DESYREL) 50 MG tablet   Backache    Relevant Medications   traMADol (ULTRAM) 50 MG tablet   Routine general medical examination at a health care facility - Primary    1) Anticipatory Guidance: Discussed importance of wearing a seatbelt while driving and not texting while driving; changing batteries in smoke detector at least once annually; wearing suntan lotion when outside; eating a balanced and moderate diet; getting physical activity at least 30 minutes per day.  2) Immunizations / Screenings / Labs:  All immunizations are up-to-date per recommendations. Due for dental and vision screening encouraged to be completed independently. Due for well woman exam and Pap smear with referral to gynecology place. Due for mammogram with mammogram order placed. All other screenings are up-to-date per recommendations.Obtain CBC, BMET, Lipid profile and TSH.  Overall well exam with risk factors for cardiovascular disease including hypertension, obesity and substance abuse. Blood pressure is well controlled with current medications. Continue to monitor blood pressure at home. 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 Discussed importance of remaining clean/sober and the risk for advancing cardiovascular disease as well as other health detriments if continued use. Recommend increasing physical activity of 30 minutes of moderate level activity outside of work on a daily basis. Discussed importance of eating a balanced, and varied, and moderate diet that is low in saturated fats and sugars/processed foods and focuses on nutrient dense foods. Recommended weight loss goal approximately 5-10% of current body weight. Continue other healthy lifestyle behaviors and choices. Follow-up prevention exam in 1 year. Follow-up office visit in 6 months or sooner for chronic conditions.      Relevant Orders   CBC   Comprehensive metabolic panel   TSH   Lipid panel   Ambulatory referral to Gynecology    Sleep disturbance   Relevant Medications   traZODone (DESYREL) 50 MG tablet    Other Visit Diagnoses    Encounter for screening mammogram for breast cancer        Relevant Orders    MM DIGITAL SCREENING BILATERAL

## 2015-07-07 NOTE — Progress Notes (Signed)
Pre visit review using our clinic review tool, if applicable. No additional management support is needed unless otherwise documented below in the visit note. 

## 2015-07-12 ENCOUNTER — Other Ambulatory Visit: Payer: Self-pay | Admitting: Family

## 2015-07-12 DIAGNOSIS — Z1231 Encounter for screening mammogram for malignant neoplasm of breast: Secondary | ICD-10-CM

## 2015-07-21 ENCOUNTER — Ambulatory Visit
Admission: RE | Admit: 2015-07-21 | Discharge: 2015-07-21 | Disposition: A | Discharge status: Home / Self Care | Payer: 59 | Source: Ambulatory Visit | Attending: Family | Admitting: Family

## 2015-07-21 DIAGNOSIS — Z1231 Encounter for screening mammogram for malignant neoplasm of breast: Secondary | ICD-10-CM

## 2015-07-27 ENCOUNTER — Ambulatory Visit (INDEPENDENT_AMBULATORY_CARE_PROVIDER_SITE_OTHER): Payer: 59 | Admitting: Obstetrics and Gynecology

## 2015-07-27 ENCOUNTER — Encounter: Payer: Self-pay | Admitting: Obstetrics and Gynecology

## 2015-07-27 VITALS — BP 130/70 | HR 60 | Resp 16 | Ht 62.5 in | Wt 215.0 lb

## 2015-07-27 DIAGNOSIS — F17200 Nicotine dependence, unspecified, uncomplicated: Secondary | ICD-10-CM

## 2015-07-27 DIAGNOSIS — Z1151 Encounter for screening for human papillomavirus (HPV): Secondary | ICD-10-CM | POA: Diagnosis not present

## 2015-07-27 DIAGNOSIS — R232 Flushing: Secondary | ICD-10-CM

## 2015-07-27 DIAGNOSIS — Z124 Encounter for screening for malignant neoplasm of cervix: Secondary | ICD-10-CM

## 2015-07-27 DIAGNOSIS — N951 Menopausal and female climacteric states: Secondary | ICD-10-CM | POA: Diagnosis not present

## 2015-07-27 DIAGNOSIS — Z720 Tobacco use: Secondary | ICD-10-CM

## 2015-07-27 DIAGNOSIS — Z01419 Encounter for gynecological examination (general) (routine) without abnormal findings: Secondary | ICD-10-CM

## 2015-07-27 NOTE — Patient Instructions (Addendum)

## 2015-07-27 NOTE — Progress Notes (Signed)
Patient ID: Wendy Carr, female   DOB: 1955/08/16, 60 y.o.   MRN: [id number] 60 y.o. G1P0010 MarriedAfrican AmericanF here for annual exam.  She is worried about some knots on her side, one up under her ribs on the left, tender.  No bleeding. Sexually active, no pain. Still having vasomotor symptoms, mostly bothered by hot flashes during the day. Mostly tolerable.     No LMP recorded. Patient is postmenopausal.          Sexually active: Yes.    The current method of family planning is post menopausal status.    Exercising: No.  The patient does not participate in regular exercise at present. Smoker:  yes  Health Maintenance: Pap:  2014 WNL per patient  History of abnormal Pap:  Yes- college, no surgery on her cervix.  MMG:  07-22-15 WNL Colonoscopy:  2014- repeat in April this year BMD:   Never TDaP:  2008 Gardasil: N/A   reports that she has been smoking Cigarettes.  She has a 2.8 pack-year smoking history. She has never used smokeless tobacco. She reports that she uses illicit drugs (Cocaine). She reports that she does not drink alcohol.3 cigarettes a day. Plans to quit, cutting back. Off of drugs since December, mostly used cocaine, tried heroin, never shot up. Husband is 10 years clean. 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   Past Medical History  Diagnosis Date  . GERD   . HYPERTENSION   . ALLERGIC RHINITIS   . ANEMIA-NOS   . ANXIETY   . ASTHMA   . HYPOTHYROIDISM   . HYPERGLYCEMIA, BORDERLINE   . Personal history of colonic polyps 2011    last colonscopy 2011: no polyps.  . Blood transfusion without reported diagnosis     Past Surgical History  Procedure Laterality Date  . Myomectomy  1997  . Breast surgery  2004    Lumpectomy, right side-Benign  . Thyroidectomy, partial  2004    Left, benign  . Vein surgery  1997    left arm  . Foot surgery  2006    right  . Hernia repair  1995    [facility] ?[place]  . Dilation and curettage of uterus      Current Outpatient  Prescriptions  Medication Sig Dispense Refill  . albuterol (VENTOLIN HFA) 108 (90 BASE) MCG/ACT inhaler INHALE 2 PUFFS BY MOUTH 4 TIMES DAILY AS NEEDED FOR SHORTNESS OF BREATH 18 g 11  . ALPRAZolam (XANAX) 0.5 MG tablet TAKE 1 TABLET BY MOUTH TWICE DAILY AS NEEDED FOR SLEEP OR ANXIETY 60 tablet 0  . beclomethasone (QVAR) 40 MCG/ACT inhaler Inhale 2 puffs into the lungs 2 (two) times daily. 1 Inhaler 11  . busPIRone (BUSPAR) 10 MG tablet Take 2 tablets (20 mg total) by mouth 2 (two) times daily. 360 tablet 3  . citalopram (CELEXA) 40 MG tablet Take 1 tablet (40 mg total) by mouth daily. 30 tablet 5  . ipratropium-albuterol (DUONEB) 0.5-2.5 (3) MG/3ML SOLN Take 3 mLs by nebulization every 6 (six) hours as needed. 360 mL 3  . lisinopril-hydrochlorothiazide (PRINZIDE,ZESTORETIC) 20-25 MG tablet Take 1 tablet by mouth daily. 90 tablet 1  . traMADol (ULTRAM) 50 MG tablet Take 1 tablet (50 mg total) by mouth every 6 (six) hours as needed. 60 tablet 0  . traZODone (DESYREL) 50 MG tablet Take 1 tablet (50 mg total) by mouth at bedtime. 30 tablet 5   No current facility-administered medications for this visit.    Family History  Problem Relation  Age of Onset  . Hypertension Mother   . Diabetes Sister   . Arthritis Other   . Diabetes Other   . Pancreatic cancer Maternal Uncle   . Diabetes Maternal Grandmother   . Ovarian cancer Maternal Grandmother     Review of Systems  Constitutional: Negative.   HENT: Negative.   Eyes: Negative.   Respiratory: Negative.   Cardiovascular: Negative.   Gastrointestinal: Negative.   Endocrine: Negative.   Genitourinary: Negative.   Musculoskeletal: Negative.   Skin: Negative.   Allergic/Immunologic: Negative.   Neurological: Negative.   Psychiatric/Behavioral: Negative.     Exam:   BP 130/70 mmHg  Pulse 60  Resp 16  Ht 5' 2.5" (1.588 m)  Wt 215 lb (97.523 kg)  BMI 38.67 kg/m2  Weight change: @WEIGHTCHANGE @ Height:   Height: 5' 2.5" (158.8 cm)  Ht  Readings from Last 3 Encounters:  07/27/15 5' 2.5" (1.588 m)  07/07/15 5\' 3"  (1.6 m)  06/05/15 5\' 3"  (1.6 m)    General appearance: alert, cooperative and appears stated age Head: Normocephalic, without obvious abnormality, atraumatic Neck: no adenopathy, supple, symmetrical, trachea midline and thyroid normal to inspection and palpation Lungs: clear to auscultation bilaterally Breasts: normal appearance, no masses or tenderness Heart: regular rate and rhythm Abdomen: soft, non-tender; bowel sounds normal; no masses,  no organomegaly Extremities: extremities normal, atraumatic, no cyanosis or edema Skin: Skin color, texture, turgor normal. No rashes or lesions Lymph nodes: Cervical, supraclavicular, and axillary nodes normal. No abnormal inguinal nodes palpated Neurologic: Grossly normal   Pelvic: External genitalia:  no lesions              Urethra:  normal appearing urethra with no masses, tenderness or lesions              Bartholins and Skenes: normal                 Vagina: atrophic appearing vagina with normal color and discharge, no lesions              Cervix: no lesions               Bimanual Exam:  Uterus:  normal size, contour, position, consistency, mobility, non-tender              Adnexa: no mass, fullness, tenderness               Rectovaginal: Confirms               Anus:  normal sphincter tone, no lesions  Chaperone was present for exam.  A:  Well Woman with normal exam  Smoker, planing to quit  Vasomotor symptoms, mostly tolerable. Discussed the option of using estrovan, discussed behavioral changes  P:   Pap with hpv   Mammogram just done  Colonoscopy next month  Discussed breast self exam  Discussed calcium and vit D intake  Labs and immunizations with primary MD

## 2015-07-29 LAB — IPS PAP TEST WITH HPV

## 2015-08-01 ENCOUNTER — Telehealth: Payer: Self-pay | Admitting: *Deleted

## 2015-08-01 NOTE — Telephone Encounter (Signed)
-----   Message from Salvadore Dom, MD sent at 08/01/2015  8:47 AM EDT ----- 02 Please inform + yeast, only treat if symptomatic. If needed, call in diflucan 150 mg po x 1, repeat in 72 hours x 1 if still symptomatic. #2, no refills.

## 2015-08-01 NOTE — Telephone Encounter (Signed)
[place] in regards to PAP results-eh

## 2015-08-05 ENCOUNTER — Ambulatory Visit (AMBULATORY_SURGERY_CENTER): Payer: Self-pay | Admitting: *Deleted

## 2015-08-05 ENCOUNTER — Telehealth: Payer: Self-pay | Admitting: Family

## 2015-08-05 VITALS — Ht 62.0 in | Wt 212.0 lb

## 2015-08-05 DIAGNOSIS — Z86010 Personal history of colonic polyps: Secondary | ICD-10-CM

## 2015-08-05 NOTE — Progress Notes (Signed)
No egg or soy allergy  No anesthesia or intubation problems per pt  No diet medications taken   

## 2015-08-05 NOTE — Telephone Encounter (Signed)
Last refill was 07/07/15

## 2015-08-08 MED FILL — ALPRAZolam 0.5 MG TABS: 0.5 | 30 days supply | Qty: 60 | Fill #0

## 2015-08-08 NOTE — Telephone Encounter (Signed)
Rx sent. Called pt and LVM

## 2015-08-08 NOTE — Telephone Encounter (Signed)
Pt called back and I let her know the prescription was sent

## 2015-08-08 NOTE — Telephone Encounter (Signed)
Patient is returning a call to [place]. Patient is aware Margaretha Sheffield is out of the office. Patient is concerned about her results and would like a call back today.

## 2015-08-08 NOTE — Telephone Encounter (Signed)
Please give patient a call back in regards at [phone]

## 2015-08-08 NOTE — Telephone Encounter (Signed)
Spoke with patient. Advised of results as seen below from [place]. She is agreeable and verbalizes understanding. States that she does not have any current yeast infection symptoms. Aware treatment is not needed unless she becomes symptomatic. She will return call if she develops and yeast infection symptoms. 02 recall placed.  Routing to provider for final review. Patient agreeable to disposition. Will close encounter.

## 2015-08-08 NOTE — Telephone Encounter (Signed)
Left message to call Kaitlyn at [phone]. 

## 2015-08-12 ENCOUNTER — Other Ambulatory Visit: Payer: Self-pay | Admitting: Family

## 2015-08-15 DIAGNOSIS — S63591D Other specified sprain of right wrist, subsequent encounter: Secondary | ICD-10-CM | POA: Diagnosis not present

## 2015-08-15 MED FILL — HYDROCODON-APAP 10-325: 10-325 | 20 days supply | Qty: 40 | Fill #0

## 2015-08-15 NOTE — Telephone Encounter (Signed)
Last refill was 07/07/15

## 2015-08-16 MED FILL — traMADol HCL 50 MG TABS: 50 | 15 days supply | Qty: 60 | Fill #0

## 2015-08-17 MED FILL — traZODone HCL 50 MG TABS: 50 | 30 days supply | Qty: 30 | Fill #6

## 2015-09-02 ENCOUNTER — Encounter: Payer: Self-pay | Admitting: Internal Medicine

## 2015-09-02 ENCOUNTER — Ambulatory Visit (AMBULATORY_SURGERY_CENTER): Payer: 59 | Admitting: Internal Medicine

## 2015-09-02 VITALS — BP 156/67 | HR 66 | Temp 98.7°F | Resp 20 | Ht 62.0 in | Wt 212.0 lb

## 2015-09-02 DIAGNOSIS — Z86010 Personal history of colonic polyps: Secondary | ICD-10-CM | POA: Diagnosis not present

## 2015-09-02 DIAGNOSIS — I10 Essential (primary) hypertension: Secondary | ICD-10-CM | POA: Diagnosis not present

## 2015-09-02 DIAGNOSIS — J45909 Unspecified asthma, uncomplicated: Secondary | ICD-10-CM | POA: Diagnosis not present

## 2015-09-02 DIAGNOSIS — F341 Dysthymic disorder: Secondary | ICD-10-CM | POA: Diagnosis not present

## 2015-09-02 MED ORDER — SODIUM CHLORIDE 0.9 % IV SOLN: 500.0000 mL | INTRAVENOUS | Status: DC

## 2015-09-02 NOTE — Progress Notes (Signed)
Report to PACU, RN, vss, BBS= Clear.  

## 2015-09-02 NOTE — Progress Notes (Signed)
No problems noted in the recovery room. maw 

## 2015-09-02 NOTE — Patient Instructions (Signed)
YOU HAD AN ENDOSCOPIC PROCEDURE TODAY AT [facility]:   Refer to the procedure report that was given to you for any specific questions about what was found during the examination.  If the procedure report does not answer your questions, please call your gastroenterologist to clarify.  If you requested that your care partner not be given the details of your procedure findings, then the procedure report has been included in a sealed envelope for you to review at your convenience later.  YOU SHOULD EXPECT: Some feelings of bloating in the abdomen. Passage of more gas than usual.  Walking can help get rid of the air that was put into your GI tract during the procedure and reduce the bloating. If you had a lower endoscopy (such as a colonoscopy or flexible sigmoidoscopy) you may notice spotting of blood in your stool or on the toilet paper. If you underwent a bowel prep for your procedure, you may not have a normal bowel movement for a few days.  Please Note:  You might notice some irritation and congestion in your nose or some drainage.  This is from the oxygen used during your procedure.  There is no need for concern and it should clear up in a day or so.  SYMPTOMS TO REPORT IMMEDIATELY:   Following lower endoscopy (colonoscopy or flexible sigmoidoscopy):  Excessive amounts of blood in the stool  Significant tenderness or worsening of abdominal pains  Swelling of the abdomen that is new, acute  Fever of 100F or higher  For urgent or emergent issues, a gastroenterologist can be reached at any hour by calling [phone].   DIET: Your first meal following the procedure should be a small meal and then it is ok to progress to your normal diet. Heavy or fried foods are harder to digest and may make you feel nauseous or bloated.  Likewise, meals heavy in dairy and vegetables can increase bloating.  Drink plenty of fluids but you should avoid alcoholic beverages for 24  hours.  ACTIVITY:  You should plan to take it easy for the rest of today and you should NOT DRIVE or use heavy machinery until tomorrow (because of the sedation medicines used during the test).    FOLLOW UP: Our staff will call the number listed on your records the next business day following your procedure to check on you and address any questions or concerns that you may have regarding the information given to you following your procedure. If we do not reach you, we will leave a message.  However, if you are feeling well and you are not experiencing any problems, there is no need to return our call.  We will assume that you have returned to your regular daily activities without incident.  If any biopsies were taken you will be contacted by phone or by letter within the next 1-3 weeks.  Please call us at [phone] if you have not heard about the biopsies in 3 weeks.    SIGNATURES/CONFIDENTIALITY: You and/or your care partner have signed paperwork which will be entered into your electronic medical record.  These signatures attest to the fact that that the information above on your After Visit Summary has been reviewed and is understood.  Full responsibility of the confidentiality of this discharge information lies with you and/or your care-partner.   Next routine screening colonoscopy in 10 years. You may resume your current medications today. Please call if any questions or concerns.

## 2015-09-02 NOTE — Op Note (Signed)
[place] Patient Name: Wendy Carr Procedure Date: 09/02/2015 1:57 PM MRN: [id number] Endoscopist: Jerene Bears , MD Age: 60 Date of Birth: Nov 23, 1955 Gender: Female Procedure:                Colonoscopy Indications:              Surveillance: Personal history of adenomatous                            polyps on last colonoscopy 5 years ago (Nov 2011) Medicines:                Monitored Anesthesia Care Procedure:                Pre-Anesthesia Assessment:                           - Prior to the procedure, a History and Physical                            was performed, and patient medications and                            allergies were reviewed. The patient's tolerance of                            previous anesthesia was also reviewed. The risks                            and benefits of the procedure and the sedation                            options and risks were discussed with the patient.                            All questions were answered, and informed consent                            was obtained. Prior Anticoagulants: The patient has                            taken no previous anticoagulant or antiplatelet                            agents. ASA Grade Assessment: II - A patient with                            mild systemic disease. After reviewing the risks                            and benefits, the patient was deemed in                            satisfactory condition to undergo the procedure.  After obtaining informed consent, the colonoscope                            was passed under direct vision. Throughout the                            procedure, the patient's blood pressure, pulse, and                            oxygen saturations were monitored continuously. The                            Model PCF-[id number]) scope was introduced                            through the anus and advanced to the the cecum,                           identified by appendiceal orifice and ileocecal                            valve. The colonoscopy was performed without                            difficulty. The patient tolerated the procedure                            well. The quality of the bowel preparation was                            good. The ileocecal valve, appendiceal orifice, and                            rectum were photographed. Scope In: 2:01:54 PM Scope Out: 2:18:37 PM Scope Withdrawal Time: 0 hours 11 minutes 37 seconds  Total Procedure Duration: 0 hours 16 minutes 43 seconds  Findings:                 The digital rectal exam was normal.                           The colon (entire examined portion) appeared normal.                           The retroflexed view of the distal rectum and anal                            verge was normal and showed no anal or rectal                            abnormalities. Complications:            No immediate complications. Estimated Blood Loss:     Estimated blood loss: none. Impression:               - The entire  examined colon is normal.                           - The distal rectum and anal verge are normal on                            retroflexion view.                           - No specimens collected. Recommendation:           - Patient has a contact number available for                            emergencies. The signs and symptoms of potential                            delayed complications were discussed with the                            patient. Return to normal activities tomorrow.                            Written discharge instructions were provided to the                            patient.                           - Resume previous diet.                           - Continue present medications.                           - Repeat colonoscopy in 10 years for screening                            purposes. Jerene Bears, MD 09/02/2015  2:22:18 PM This report has been signed electronically.

## 2015-09-05 ENCOUNTER — Telehealth: Payer: Self-pay

## 2015-09-05 ENCOUNTER — Other Ambulatory Visit: Payer: Self-pay | Admitting: Family

## 2015-09-05 MED FILL — CITALOPRAM HBR 40 MG TABLET: 40 | 30 days supply | Qty: 30 | Fill #1

## 2015-09-05 NOTE — Telephone Encounter (Signed)
  Follow up Call-  Call back number 09/02/2015  Post procedure Call Back phone  # [phone]  Permission to leave phone message Yes     Patient questions:  Do you have a fever, pain , or abdominal swelling? No. Pain Score  0 *  Have you tolerated food without any problems? Yes.    Have you been able to return to your normal activities? Yes.    Do you have any questions about your discharge instructions: Diet   No. Medications  No. Follow up visit  No.  Do you have questions or concerns about your Care? No.  Actions: * If pain score is 4 or above: No action needed, pain <4.  No problems noted per the pt. maw

## 2015-09-06 MED FILL — ALPRAZolam 0.5 MG TABS: 0.5 | 30 days supply | Qty: 60 | Fill #0

## 2015-09-06 NOTE — Telephone Encounter (Signed)
Faxed script back to [facility].../lmb 

## 2015-09-23 MED FILL — traZODone HCL 50 MG TABS: 50 | 30 days supply | Qty: 30 | Fill #7

## 2015-09-23 MED FILL — traMADol HCL 50 MG TABS: 50 | 15 days supply | Qty: 60 | Fill #1

## 2015-10-07 MED FILL — LISINOPRIL-HCTZ 20-25 MG TA: 20-25 | 90 days supply | Qty: 90 | Fill #1

## 2015-10-07 MED FILL — CITALOPRAM HBR 40 MG TABLET: 40 | 30 days supply | Qty: 30 | Fill #2

## 2015-10-07 MED FILL — ALPRAZolam 0.5 MG TABS: 0.5 | 30 days supply | Qty: 60 | Fill #1

## 2015-10-17 MED FILL — traMADol HCL 50 MG TABS: 50 | 15 days supply | Qty: 60 | Fill #2

## 2015-11-04 MED FILL — ALPRAZolam 0.5 MG TABS: 0.5 | 30 days supply | Qty: 60 | Fill #2

## 2015-11-04 MED FILL — traZODone HCL 50 MG TABS: 50 | 30 days supply | Qty: 30 | Fill #8

## 2015-11-15 MED FILL — traMADol HCL 50 MG TABS: 50 | 15 days supply | Qty: 60 | Fill #3

## 2015-12-05 ENCOUNTER — Other Ambulatory Visit: Payer: Self-pay | Admitting: Family

## 2015-12-05 NOTE — Telephone Encounter (Signed)
Last refill was 09/06/15. Last OV was 10/11/14

## 2015-12-06 NOTE — Telephone Encounter (Signed)
Pt left msg on triage requesting refill on her Alprazolam.../lmb 

## 2015-12-07 ENCOUNTER — Telehealth: Payer: Self-pay | Admitting: Family

## 2015-12-07 MED FILL — ALPRAZolam 0.5 MG TABS: 0.5 | 30 days supply | Qty: 60 | Fill #0

## 2015-12-07 NOTE — Telephone Encounter (Signed)
Patient Name: Wendy Carr  DOB: 07-Aug-1955    Initial Comment Caller has been out of alprazolam for the the last two days, feeling anxious    Nurse Assessment  Nurse: Mallie Mussel, RN, Alveta Heimlich Date/Time (Eastern Time): 12/07/2015 8:03:17 AM  Please select the assessment type ---Refill  Additional Documentation ---Caller states that she has been without her Alprazolam 0.5mg  1 PO BID for Anxiety. She denies any symptoms at this time. Advised her that I will forward her request to the office and someone will be calling her back. She verbalized understanding.  Does the patient have enough medication to last until the office opens? ---Unable to obtain loaner dose from Pharmacy  Does the client directives allow for assistance with medications after hours? ---No  Additional Documentation ---Uses [facility]     Guidelines    Guideline Title Affirmed Question Affirmed Notes       Final Disposition User   Attempt made - message left Mallie Mussel, RN, Alveta Heimlich

## 2015-12-07 NOTE — Telephone Encounter (Signed)
[name] faxed script back to pharmacy this morning...[name]

## 2015-12-07 NOTE — Telephone Encounter (Signed)
Approved and to be faxed.

## 2015-12-12 MED FILL — traZODone HCL 50 MG TABS: 50 | 30 days supply | Qty: 30 | Fill #0

## 2015-12-12 MED FILL — traMADol HCL 50 MG TABS: 50 | 15 days supply | Qty: 60 | Fill #4

## 2015-12-15 DIAGNOSIS — M1711 Unilateral primary osteoarthritis, right knee: Secondary | ICD-10-CM | POA: Diagnosis not present

## 2015-12-15 DIAGNOSIS — M545 Low back pain: Secondary | ICD-10-CM | POA: Diagnosis not present

## 2015-12-15 DIAGNOSIS — M1712 Unilateral primary osteoarthritis, left knee: Secondary | ICD-10-CM | POA: Diagnosis not present

## 2016-01-02 ENCOUNTER — Other Ambulatory Visit: Payer: Self-pay | Admitting: Family

## 2016-01-02 DIAGNOSIS — I10 Essential (primary) hypertension: Secondary | ICD-10-CM

## 2016-01-02 MED FILL — traMADol HCL 50 MG TABS: 50 | 15 days supply | Qty: 60 | Fill #5

## 2016-01-02 MED FILL — LISINOPRIL-HCTZ 20-25 MG TA: 20-25 | 90 days supply | Qty: 90 | Fill #0

## 2016-01-02 MED FILL — CITALOPRAM HBR 40 MG TABLET: 40 | 30 days supply | Qty: 30 | Fill #3

## 2016-01-02 MED FILL — busPIRone HCL 10 MG TABS: 10 | 90 days supply | Qty: 360 | Fill #0

## 2016-01-05 MED FILL — traZODone HCL 50 MG TABS: 50 | 30 days supply | Qty: 30 | Fill #1

## 2016-01-05 MED FILL — ALPRAZolam 0.5 MG TABS: 0.5 | 30 days supply | Qty: 60 | Fill #1

## 2016-02-06 ENCOUNTER — Other Ambulatory Visit: Payer: Self-pay | Admitting: Family

## 2016-02-06 MED FILL — ALPRAZolam 0.5 MG TABS: 0.5 | 30 days supply | Qty: 60 | Fill #2

## 2016-02-07 NOTE — Telephone Encounter (Signed)
Last refill was 08/15/15

## 2016-02-07 NOTE — Telephone Encounter (Signed)
Rx faxed

## 2016-02-13 MED FILL — traMADol HCL 50 MG TABS: 50 | 15 days supply | Qty: 60 | Fill #0

## 2016-02-13 NOTE — Telephone Encounter (Signed)
Rec'd fax for refill on the Tramadol. Per chart rx was faxed back to pharmacy. Called [facility] pharmacy spoke w/Crystal verified if they received fax per crystal they have not received. Gave verbal authorization form Dr. Quay Burow on 10/3...[name]

## 2016-02-20 DIAGNOSIS — H52223 Regular astigmatism, bilateral: Secondary | ICD-10-CM | POA: Diagnosis not present

## 2016-02-20 DIAGNOSIS — H524 Presbyopia: Secondary | ICD-10-CM | POA: Diagnosis not present

## 2016-02-20 DIAGNOSIS — H40013 Open angle with borderline findings, low risk, bilateral: Secondary | ICD-10-CM | POA: Diagnosis not present

## 2016-02-23 DIAGNOSIS — K029 Dental caries, unspecified: Secondary | ICD-10-CM | POA: Diagnosis not present

## 2016-02-23 DIAGNOSIS — R6884 Jaw pain: Secondary | ICD-10-CM | POA: Diagnosis not present

## 2016-02-24 MED FILL — HYDROCODON-APAP 5-325: 5-325 | 7 days supply | Qty: 12 | Fill #0

## 2016-03-12 ENCOUNTER — Other Ambulatory Visit: Payer: Self-pay | Admitting: Family

## 2016-03-13 ENCOUNTER — Ambulatory Visit (INDEPENDENT_AMBULATORY_CARE_PROVIDER_SITE_OTHER): Payer: 59 | Admitting: Family Medicine

## 2016-03-13 ENCOUNTER — Ambulatory Visit (INDEPENDENT_AMBULATORY_CARE_PROVIDER_SITE_OTHER): Payer: 59

## 2016-03-13 VITALS — BP 118/80 | HR 110 | Temp 99.8°F | Resp 20 | Wt 211.2 lb

## 2016-03-13 DIAGNOSIS — R112 Nausea with vomiting, unspecified: Secondary | ICD-10-CM

## 2016-03-13 DIAGNOSIS — R000 Tachycardia, unspecified: Secondary | ICD-10-CM

## 2016-03-13 DIAGNOSIS — J4541 Moderate persistent asthma with (acute) exacerbation: Secondary | ICD-10-CM

## 2016-03-13 DIAGNOSIS — R509 Fever, unspecified: Secondary | ICD-10-CM

## 2016-03-13 DIAGNOSIS — R05 Cough: Secondary | ICD-10-CM

## 2016-03-13 DIAGNOSIS — R058 Other specified cough: Secondary | ICD-10-CM

## 2016-03-13 LAB — POCT URINALYSIS DIP (MANUAL ENTRY)
BILIRUBIN UA: NEGATIVE
BILIRUBIN UA: NEGATIVE
Glucose, UA: NEGATIVE
Leukocytes, UA: NEGATIVE
Nitrite, UA: NEGATIVE
PH UA: 5.5
PROTEIN UA: NEGATIVE
RBC UA: NEGATIVE
Spec Grav, UA: <=1.005
Urobilinogen, UA: 0.2

## 2016-03-13 LAB — POCT CBC
Granulocyte percent: 58 %G (ref 37–80)
HEMATOCRIT: 37.3 % — AB (ref 37.7–47.9)
HEMOGLOBIN: 13 g/dL (ref 12.2–16.2)
Lymph, poc: 2.8 (ref 0.6–3.4)
MCH: 28.7 pg (ref 27–31.2)
MCHC: 34.9 g/dL (ref 31.8–35.4)
MCV: 82.1 fL (ref 80–97)
MID (cbc): 0.3 (ref 0–0.9)
MPV: 9.1 fL (ref 0–99.8)
POC GRANULOCYTE: 4.3 (ref 2–6.9)
POC LYMPH PERCENT: 38.5 %L (ref 10–50)
POC MID %: 3.5 % (ref 0–12)
Platelet Count, POC: 186 10*3/uL (ref 142–424)
RBC: 4.54 M/uL (ref 4.04–5.48)
RDW, POC: 16.9 %
WBC: 7.4 10*3/uL (ref 4.6–10.2)

## 2016-03-13 LAB — POCT INFLUENZA A/B
Influenza A, POC: NEGATIVE
Influenza B, POC: NEGATIVE

## 2016-03-13 MED ORDER — METHYLPREDNISOLONE SODIUM SUCC 125 MG IJ SOLR
125.0000 mg | Freq: Once | INTRAMUSCULAR | Status: AC
  Administered 2016-03-13: 125 mg via INTRAMUSCULAR

## 2016-03-13 MED ORDER — PREDNISONE 10 MG PO TABS: ORAL_TABLET | ORAL | 0 refills | Status: DC

## 2016-03-13 MED ORDER — AMOXICILLIN-POT CLAVULANATE 875-125 MG PO TABS: 1.0000 | ORAL_TABLET | Freq: Two times a day (BID) | ORAL | 0 refills | Status: DC

## 2016-03-13 MED ORDER — MONTELUKAST SODIUM 10 MG PO TABS
10.0000 mg | ORAL_TABLET | Freq: Every day | ORAL | 11 refills | Status: DC
Start: 2016-03-13 — End: 2017-08-14

## 2016-03-13 MED FILL — ALPRAZolam 0.5 MG TABS: 0.5 | 30 days supply | Qty: 60 | Fill #0

## 2016-03-13 NOTE — Patient Instructions (Addendum)
IF you received an x-ray today, you will receive an invoice from [facility]. Please contact [facility] at [phone] with questions or concerns regarding your invoice.   IF you received labwork today, you will receive an invoice from [employer]. Please contact [facility] at [phone] with questions or concerns regarding your invoice.   Our billing staff will not be able to assist you with questions regarding bills from these companies.  You will be contacted with the lab results as soon as they are available. The fastest way to get your results is to activate your My Chart account. Instructions are located on the last page of this paperwork. If you have not heard from [place] regarding the results in 2 weeks, please contact this office.     Asthma Attack Prevention While you may not be able to control the fact that you have asthma, you can take actions to prevent asthma attacks. The best way to prevent asthma attacks is to maintain good control of your asthma. You can achieve this by:  Taking your medicines as directed.  Avoiding things that can irritate your airways or make your asthma symptoms worse (asthma triggers).  Keeping track of how well your asthma is controlled and of any changes in your symptoms.  Responding quickly to worsening asthma symptoms (asthma attack).  Seeking emergency care when it is needed. WHAT ARE SOME WAYS TO PREVENT AN ASTHMA ATTACK? Have a Plan Work with your health care provider to create a written plan for managing and treating your asthma attacks (asthma action plan). This plan includes:  A list of your asthma triggers and how you can avoid them.  Information on when medicines should be taken and when their dosages should be changed.  The use of a device that measures how well your lungs are working (peak flow meter). Monitor Your Asthma Use your peak flow meter and record your results in a journal  every day. A drop in your peak flow numbers on one or more days may indicate the start of an asthma attack. This can happen even before you start to feel symptoms. You can prevent an asthma attack from getting worse by following the steps in your asthma action plan. Avoid Asthma Triggers Work with your asthma health care provider to find out what your asthma triggers are. This can be done by:  Allergy testing.  Keeping a journal that notes when asthma attacks occur and the factors that may have contributed to them.  Determining if there are other medical conditions that are making your asthma worse. Once you have determined your asthma triggers, take steps to avoid them. This may include avoiding excessive or prolonged exposure to:  Dust. Have someone dust and vacuum your home for you once or twice a week. Using a high-efficiency particulate arrestance (HEPA) vacuum is best.  Smoke. This includes campfire smoke, forest fire smoke, and secondhand smoke from tobacco products.  Pet dander. Avoid contact with animals that you know you are allergic to.  Allergens from trees, grasses or pollens. Avoid spending a lot of time outdoors when pollen counts are high, and on very windy days.  Very cold, dry, or humid air.  Mold.  Foods that contain high amounts of sulfites.  Strong odors.  Outdoor air pollutants, such as Lexicographer.  Indoor air pollutants, such as aerosol sprays and fumes from household cleaners.  Household pests, including dust mites and cockroaches, and pest droppings.  Certain medicines, including  NSAIDs. Always talk to your health care provider before stopping or starting any new medicines. Medicines Take over-the-counter and prescription medicines only as told by your health care provider. Many asthma attacks can be prevented by carefully following your medicine schedule. Taking your medicines correctly is especially important when you cannot avoid certain asthma  triggers. Act Quickly If an asthma attack does happen, acting quickly can decrease how severe it is and how long it lasts. Take these steps:   Pay attention to your symptoms. If you are coughing, wheezing, or having difficulty breathing, do not wait to see if your symptoms go away on their own. Follow your asthma action plan.  If you have followed your asthma action plan and your symptoms are not improving, call your health care provider or seek immediate medical care at the nearest hospital. It is important to note how often you need to use your fast-acting rescue inhaler. If you are using your rescue inhaler more often, it may mean that your asthma is not under control. Adjusting your asthma treatment plan may help you to prevent future asthma attacks and help you to gain better control of your condition. HOW CAN I PREVENT AN ASTHMA ATTACK WHEN I EXERCISE? Follow advice from your health care provider about whether you should use your fast-acting inhaler before exercising. Many people with asthma experience exercise-induced bronchoconstriction (EIB). This condition often worsens during vigorous exercise in cold, humid, or dry environments. Usually, people with EIB can stay very active by pre-treating with a fast-acting inhaler before exercising.   This information is not intended to replace advice given to you by your health care provider. Make sure you discuss any questions you have with your health care provider.   Document Released: 04/11/2009 Document Revised: 01/12/2015 Document Reviewed: 09/23/2014 Elsevier Interactive Patient Education [employer].

## 2016-03-13 NOTE — Telephone Encounter (Signed)
Rx faxed

## 2016-03-13 NOTE — Progress Notes (Signed)
Chief Complaint  Patient presents with  . URI    sob x 2 weeks    HPI She reports that 3 weeks ago she had sick contacts (preK children) She reports that with mucinex her symptoms improved She reports that she had a cough a week ago that was painful in her chest and very barky She states that after a few days the cough improved but she has been coughing ever since  She has been using her nebulizer for her asthma She states that she does the nebulizer twice a day She uses her rescue inhaler more than 3 times a day She reports that this weekend (3 days ago) she went to the beach and she was very uncomfortable because she cannot get ceiling fans.  She reports that due to the changes in the climate she felt worse and fevers and chills  She reports that she felt very tight with her breathing She reports that her asthma is triggered by weather changes, respiratory infections and anxiety. She reports that when the seasons change she gets shortness of breath daily She has never taken singulair  She reports that she vomited up mucus since this weekend  She reports that the color is gray  She reports that she has to sit up to sleep She has taken mucinex but she is still coughing  She is eating very little but mostly drinking water    Past Medical History:  Diagnosis Date  . ALLERGIC RHINITIS   . Allergy   . ANEMIA-NOS   . ANXIETY   . Arthritis   . ASTHMA   . Blood transfusion without reported diagnosis   . GERD   . HYPERGLYCEMIA, BORDERLINE   . HYPERTENSION   . HYPOTHYROIDISM   . Personal history of colonic polyps 2011   last colonscopy 2011: no polyps.    Current Outpatient Prescriptions  Medication Sig Dispense Refill  . albuterol (VENTOLIN HFA) 108 (90 BASE) MCG/ACT inhaler INHALE 2 PUFFS BY MOUTH 4 TIMES DAILY AS NEEDED FOR SHORTNESS OF BREATH 18 g 11  . ALPRAZolam (XANAX) 0.5 MG tablet TAKE 1 TABLET BY MOUTH TWICE DAILY AS NEEDED FOR SLEEP OR ANXIETY 60 tablet 0  .  beclomethasone (QVAR) 40 MCG/ACT inhaler Inhale 2 puffs into the lungs 2 (two) times daily. 1 Inhaler 11  . busPIRone (BUSPAR) 10 MG tablet Take 2 tablets (20 mg total) by mouth 2 (two) times daily. 360 tablet 3  . citalopram (CELEXA) 40 MG tablet Take 1 tablet (40 mg total) by mouth daily. 30 tablet 5  . ipratropium-albuterol (DUONEB) 0.5-2.5 (3) MG/3ML SOLN Take 3 mLs by nebulization every 6 (six) hours as needed. 360 mL 3  . lisinopril-hydrochlorothiazide (PRINZIDE,ZESTORETIC) 20-25 MG tablet TAKE 1 TABLET BY MOUTH DAILY. 90 tablet 1  . traMADol (ULTRAM) 50 MG tablet TAKE 1 TABLET BY MOUTH EVERY 6 HOURS AS NEEDED 60 tablet PRN  . traZODone (DESYREL) 50 MG tablet Take 1 tablet (50 mg total) by mouth at bedtime. 30 tablet 5  . amoxicillin-clavulanate (AUGMENTIN) 875-125 MG tablet Take 1 tablet by mouth 2 (two) times daily. 20 tablet 0  . montelukast (SINGULAIR) 10 MG tablet Take 1 tablet (10 mg total) by mouth at bedtime. 30 tablet 11  . predniSONE (DELTASONE) 10 MG tablet Take 6 tabs for 1 day, 5 tabs for 1 day, 4 tabs for 1 day, 3 tabs for 1 day, 2 tabs for 1 day, 1 tab for 1 day 21 tablet 0   No current  facility-administered medications for this visit.     Allergies:  Allergies  Allergen Reactions  . Naproxen Other (See Comments)    Stomach cramps     Past Surgical History:  Procedure Laterality Date  . BREAST SURGERY  2004   Lumpectomy, right side-Benign  . COLONOSCOPY    . DILATION AND CURETTAGE OF UTERUS    . FOOT SURGERY  2006   right  . [place] ?[place],  . MYOMECTOMY  1997  . THYROIDECTOMY, PARTIAL  2004   Left, benign  . [place]   left arm    Social History   Social History  . Marital status: Married    Spouse name: N/A  . Number of children: 0  . Years of education: 9   Social History Main Topics  . Smoking status: Current Every Day Smoker    Packs/day: 0.40    Years: 7.00    Types: Cigarettes  . Smokeless tobacco: Never  Used     Comment: Married 2009-lives with spouse, no kids  . Alcohol use No  . 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 Sexual activity: Yes    Partners: Male    Birth control/ protection: Abstinence   Other Topics Concern  . None   Social History Narrative   Denies abuse and feels safe at home.     ROS  Objective: Vitals:   03/13/16 1716  BP: 118/80  Pulse: (!) 110  Resp: 20  Temp: 99.8 F (37.7 C)  TempSrc: Oral  SpO2: 99%  Weight: 211 lb 3.2 oz (95.8 kg)    Physical Exam General: alert, oriented, in NAD Head: normocephalic, atraumatic, no sinus tenderness Eyes: EOM intact, no scleral icterus or conjunctival injection Ears: TM clear bilaterally Throat: no pharyngeal exudate or erythema Lymph: no posterior auricular, submental or cervical lymph adenopathy Heart: normal rate, normal sinus rhythm, no murmurs Lungs: clear to auscultation bilaterally, no wheezing   Assessment and Plan Francys was seen today for uri.  Diagnoses and all orders for this visit:  Fever and chills -     POCT CBC -     POCT urinalysis dipstick -     POCT Influenza A/B -     DG Chest 2 View  Non-intractable vomiting with nausea, unspecified vomiting type -     POCT CBC -     POCT urinalysis dipstick -     POCT Influenza A/B  Tachycardia -     POCT CBC -     POCT urinalysis dipstick -     POCT Influenza A/B -     DG Chest 2 View  Moderate persistent asthma with acute exacerbation -     methylPREDNISolone sodium succinate (SOLU-MEDROL) 125 mg/2 mL injection 125 mg; Inject 2 mLs (125 mg total) into the muscle once.  Productive cough -     DG Chest 2 View  Other orders -     predniSONE (DELTASONE) 10 MG tablet; Take 6 tabs for 1 day, 5 tabs for 1 day, 4 tabs for 1 day, 3 tabs for 1 day, 2 tabs for 1 day, 1 tab for 1 day -     amoxicillin-clavulanate (AUGMENTIN) 875-125 MG tablet; Take 1 tablet by mouth 2 (two) times  daily. -     montelukast (SINGULAIR) 10 MG tablet; Take 1 tablet (10 mg total) by  mouth at bedtime.  Medical Decision Making Based on patient presentation with diffuse wheezing, tachycardia and cough pt was evaluated with physical exam and cxr. CXR was negative for pneumonia.  She was give solumedrol in clinic and given a prescription for prednisone taper and antibiotic as her asthma might be precipitated by acute bronchitis.  She was also given singulair to help with the allergy component and seasonal changes. She was also advised to follow up closely in 3 days and then in one week.   Due to the complexity of the patient encounter this visit took a total of 45 minutes  A total of 45 minutes were spent face-to-face with the patient during this encounter and over half of that time was spent on counseling and coordination of care.    Loving

## 2016-03-14 MED FILL — predniSONE 10 MG TABS: 10 | 6 days supply | Qty: 21 | Fill #0

## 2016-03-14 MED FILL — MONTELUKAST SOD 10 MG TAB: 10 | 30 days supply | Qty: 30 | Fill #0

## 2016-03-14 MED FILL — AMOX-CLAV 875-125 MG TABLET: 875-125 | 10 days supply | Qty: 20 | Fill #0

## 2016-03-16 ENCOUNTER — Ambulatory Visit: Payer: 59

## 2016-03-30 ENCOUNTER — Other Ambulatory Visit: Payer: Self-pay | Admitting: Internal Medicine

## 2016-03-30 MED FILL — traZODone HCL 50 MG TABS: 50 | 30 days supply | Qty: 30 | Fill #2

## 2016-03-30 MED FILL — LISINOPRIL-HCTZ 20-25 MG TA: 20-25 | 90 days supply | Qty: 90 | Fill #1

## 2016-04-02 MED FILL — traMADol HCL 50 MG TABS: 50 | 15 days supply | Qty: 60 | Fill #0

## 2016-04-02 NOTE — Telephone Encounter (Signed)
Last refill was 02/07/16

## 2016-04-09 ENCOUNTER — Encounter: Payer: Self-pay | Admitting: Family

## 2016-04-09 ENCOUNTER — Telehealth: Payer: Self-pay | Admitting: Family

## 2016-04-09 ENCOUNTER — Telehealth: Payer: Self-pay | Admitting: *Deleted

## 2016-04-09 MED ORDER — ALPRAZOLAM 0.5 MG PO TABS: ORAL_TABLET | ORAL | 0 refills | Status: DC

## 2016-04-09 NOTE — Telephone Encounter (Signed)
Patient was informed that the letter is ready for pick up.

## 2016-04-09 NOTE — Telephone Encounter (Signed)
Pt called in again about the refill on this med

## 2016-04-09 NOTE — Telephone Encounter (Signed)
I will refill the medication with the date of 12/6 as her most recent 30 day supply was dispensed on 03/13/16. I will not fill it sooner. She will need an office visit for additional refills as it has been since March that she has last been seen.   Note:  If she is out of medication then she will need to schedule an appointment to discuss the reason the medication is not being taken as instructed or for medication adjustments.

## 2016-04-09 NOTE — Telephone Encounter (Signed)
LVM letting pt know rx is ready for pick up

## 2016-04-09 NOTE — Telephone Encounter (Signed)
Letter written for her to either pick up or we can fax it.

## 2016-04-09 NOTE — Telephone Encounter (Signed)
Patient is requesting a letter stating she had a CPE back in March. ?? She needs to give it to her work. Please advise or follow up, Thank you.   Patient is also requesting the refill on her alprazolam.

## 2016-04-09 NOTE — Telephone Encounter (Signed)
Rec'd call pt requesting refill on her Alprazolam.../lmb 

## 2016-04-11 ENCOUNTER — Ambulatory Visit (INDEPENDENT_AMBULATORY_CARE_PROVIDER_SITE_OTHER): Payer: 59

## 2016-04-11 ENCOUNTER — Encounter: Payer: Self-pay | Admitting: Family

## 2016-04-11 DIAGNOSIS — Z111 Encounter for screening for respiratory tuberculosis: Secondary | ICD-10-CM | POA: Diagnosis not present

## 2016-04-11 DIAGNOSIS — Z79899 Other long term (current) drug therapy: Secondary | ICD-10-CM | POA: Diagnosis not present

## 2016-04-11 DIAGNOSIS — Z79891 Long term (current) use of opiate analgesic: Secondary | ICD-10-CM | POA: Diagnosis not present

## 2016-04-11 MED FILL — ALPRAZolam 0.5 MG TABS: 0.5 | 30 days supply | Qty: 60 | Fill #0

## 2016-04-13 LAB — TB SKIN TEST
INDURATION: 0 mm
TB Skin Test: NEGATIVE

## 2016-04-17 ENCOUNTER — Other Ambulatory Visit: Payer: Self-pay | Admitting: *Deleted

## 2016-05-14 ENCOUNTER — Telehealth: Payer: Self-pay | Admitting: Family

## 2016-05-15 NOTE — Telephone Encounter (Signed)
Patient was noted to have a positive urine drug screen. Therefore no additional controlled substances can be provided, however we are more than happy to provide continued primary care.

## 2016-05-15 NOTE — Telephone Encounter (Signed)
Rec'd call pt requesting refills on her tramadol & xanax...[name]

## 2016-05-15 NOTE — Telephone Encounter (Signed)
Patient called back.  Gave her Greg's response on substance refills.

## 2016-05-29 MED FILL — traZODone HCL 50 MG TABS: 50 | 30 days supply | Qty: 30 | Fill #3

## 2016-05-29 MED FILL — MONTELUKAST SOD 10 MG TAB: 10 | 30 days supply | Qty: 30 | Fill #1

## 2016-06-05 ENCOUNTER — Encounter (HOSPITAL_COMMUNITY): Payer: Self-pay | Admitting: Emergency Medicine

## 2016-06-05 ENCOUNTER — Emergency Department (HOSPITAL_COMMUNITY): Payer: 59

## 2016-06-05 ENCOUNTER — Emergency Department (HOSPITAL_COMMUNITY)
Admission: EM | Admit: 2016-06-05 | Discharge: 2016-06-05 | Disposition: A | Discharge status: Home / Self Care | Payer: 59 | Attending: Emergency Medicine | Admitting: Emergency Medicine

## 2016-06-05 DIAGNOSIS — F17210 Nicotine dependence, cigarettes, uncomplicated: Secondary | ICD-10-CM | POA: Insufficient documentation

## 2016-06-05 DIAGNOSIS — I10 Essential (primary) hypertension: Secondary | ICD-10-CM | POA: Diagnosis not present

## 2016-06-05 DIAGNOSIS — Z79899 Other long term (current) drug therapy: Secondary | ICD-10-CM | POA: Diagnosis not present

## 2016-06-05 DIAGNOSIS — R69 Illness, unspecified: Secondary | ICD-10-CM

## 2016-06-05 DIAGNOSIS — J111 Influenza due to unidentified influenza virus with other respiratory manifestations: Secondary | ICD-10-CM | POA: Diagnosis not present

## 2016-06-05 DIAGNOSIS — E039 Hypothyroidism, unspecified: Secondary | ICD-10-CM | POA: Diagnosis not present

## 2016-06-05 DIAGNOSIS — J45909 Unspecified asthma, uncomplicated: Secondary | ICD-10-CM | POA: Insufficient documentation

## 2016-06-05 DIAGNOSIS — R05 Cough: Secondary | ICD-10-CM | POA: Diagnosis not present

## 2016-06-05 DIAGNOSIS — R509 Fever, unspecified: Secondary | ICD-10-CM | POA: Diagnosis present

## 2016-06-05 MED ORDER — AEROCHAMBER PLUS W/MASK MISC: 2 refills | Status: DC

## 2016-06-05 MED ORDER — ALBUTEROL SULFATE HFA 108 (90 BASE) MCG/ACT IN AERS: 1.0000 | INHALATION_SPRAY | Freq: Four times a day (QID) | RESPIRATORY_TRACT | 0 refills | Status: DC | PRN

## 2016-06-05 MED ORDER — HYDROCODONE-HOMATROPINE 5-1.5 MG/5ML PO SYRP
10.0000 mL | ORAL_SOLUTION | Freq: Once | ORAL | Status: AC
  Administered 2016-06-05: 10 mL via ORAL
  Filled 2016-06-05: qty 10

## 2016-06-05 MED ORDER — ACETAMINOPHEN 500 MG PO TABS
1000.0000 mg | ORAL_TABLET | Freq: Once | ORAL | Status: AC
  Administered 2016-06-05: 1000 mg via ORAL
  Filled 2016-06-05: qty 2

## 2016-06-05 MED ORDER — ACETAMINOPHEN 500 MG PO TABS: 1000.0000 mg | ORAL_TABLET | Freq: Four times a day (QID) | ORAL | 0 refills | Status: DC | PRN

## 2016-06-05 MED ORDER — HYDROCODONE-HOMATROPINE 5-1.5 MG/5ML PO SYRP: 10.0000 mL | ORAL_SOLUTION | Freq: Four times a day (QID) | ORAL | 0 refills | Status: DC | PRN

## 2016-06-05 MED FILL — VENTOLIN HFA 90 MCG INHALER: 108 (90 BAS | 25 days supply | Qty: 18 | Fill #0

## 2016-06-05 MED FILL — HYDROCODONE-HOMATROPINE SYR: 5-1.5 | 3 days supply | Qty: 120 | Fill #0

## 2016-06-05 NOTE — ED Provider Notes (Signed)
[facility] DEPT Provider Note   CSN: [id number] Arrival date & time: 06/05/16  [id number]     History   Chief Complaint Chief Complaint  Patient presents with  . Cough  . Fever    HPI Wendy Carr is a 61 y.o. female.  HPI Patient reports that she's been sick for almost 10 days. She reports she works in [employer] and there've been multiple cases of influenza. Symptoms started with vomiting. This resolved. She now has had many days of coughing and nasal congestion. She reports that the coughing is harsh and it is giving her a bad headache. Past Medical History:  Diagnosis Date  . ALLERGIC RHINITIS   . Allergy   . ANEMIA-NOS   . ANXIETY   . Arthritis   . ASTHMA   . Blood transfusion without reported diagnosis   . GERD   . HYPERGLYCEMIA, BORDERLINE   . HYPERTENSION   . HYPOTHYROIDISM   . Personal history of colonic polyps 2011   last colonscopy 2011: no polyps.    Patient Active Problem List   Diagnosis Date Noted  . Routine general medical examination at a health care facility 07/07/2015  . Sleep disturbance 07/07/2015  . Cough 10/11/2014  . Arthritis 03/30/2014  . Tooth pain 03/30/2014  . Opiate withdrawal ([place]) 12/16/2011  . Cocaine abuse 12/16/2011  . Backache 09/03/2008  . HYPERGLYCEMIA, BORDERLINE 09/03/2008  . ANEMIA-NOS 08/20/2008  . Anxiety state 08/20/2008  . Essential hypertension 08/20/2008  . ALLERGIC RHINITIS 08/20/2008  . Asthma 08/20/2008  . GERD 08/20/2008    Past Surgical History:  Procedure Laterality Date  . BREAST SURGERY  2004   Lumpectomy, right side-Benign  . COLONOSCOPY    . DILATION AND CURETTAGE OF UTERUS    . FOOT SURGERY  2006   right  . [place] ?[place]  . MYOMECTOMY  1997  . THYROIDECTOMY, PARTIAL  2004   Left, benign  . VEIN SURGERY  1997   left arm    OB History    Gravida Para Term Preterm AB Living   1       1     SAB TAB Ectopic Multiple Live Births                   Home  Medications    Prior to Admission medications   Medication Sig Start Date End Date Taking? Authorizing Provider  albuterol (VENTOLIN HFA) 108 (90 BASE) MCG/ACT inhaler INHALE 2 PUFFS BY MOUTH 4 TIMES DAILY AS NEEDED FOR SHORTNESS OF BREATH 05/31/14  Yes Thao P Le, DO  beclomethasone (QVAR) 40 MCG/ACT inhaler Inhale 2 puffs into the lungs 2 (two) times daily. 05/31/14  Yes Thao P Le, DO  busPIRone (BUSPAR) 10 MG tablet Take 2 tablets (20 mg total) by mouth 2 (two) times daily. 07/07/15  Yes Golden Circle, FNP  citalopram (CELEXA) 40 MG tablet Take 1 tablet (40 mg total) by mouth daily. 07/07/15  Yes Golden Circle, FNP  dextromethorphan-guaiFENesin [facility] DM) 30-600 MG 12hr tablet Take 1 tablet by mouth 2 (two) times daily as needed for cough.   Yes Historical Provider, MD  lisinopril-hydrochlorothiazide (PRINZIDE,ZESTORETIC) 20-25 MG tablet TAKE 1 TABLET BY MOUTH DAILY. 01/02/16  Yes Golden Circle, FNP  loratadine (CLARITIN) 10 MG tablet Take 10 mg by mouth daily as needed for allergies.   Yes Historical Provider, MD  montelukast (SINGULAIR) 10 MG tablet Take 1 tablet (10 mg total) by mouth at bedtime.  03/13/16  Yes Forrest Moron, MD  traZODone (DESYREL) 50 MG tablet Take 1 tablet (50 mg total) by mouth at bedtime. 07/07/15  Yes Golden Circle, FNP  vitamin B-12 (CYANOCOBALAMIN) 500 MCG tablet Take 500 mcg by mouth daily.   Yes Historical Provider, MD  acetaminophen (TYLENOL) 500 MG tablet Take 2 tablets (1,000 mg total) by mouth every 6 (six) hours as needed. 06/05/16   Charlesetta Shanks, MD  albuterol (PROVENTIL HFA;VENTOLIN HFA) 108 (90 Base) MCG/ACT inhaler Inhale 1-2 puffs into the lungs every 6 (six) hours as needed for wheezing or shortness of breath. 06/05/16   Charlesetta Shanks, MD  ALPRAZolam Duanne Moron) 0.5 MG tablet TAKE 1 TABLET BY MOUTH TWICE DAILY AS NEEDED FOR SLEEP OR ANXIETY Patient not taking: Reported on 06/05/2016 04/09/16   Golden Circle, FNP  amoxicillin-clavulanate (AUGMENTIN)  875-125 MG tablet Take 1 tablet by mouth 2 (two) times daily. Patient not taking: Reported on 06/05/2016 03/13/16   Forrest Moron, MD  HYDROcodone-homatropine [facility]) 5-1.5 MG/5ML syrup Take 10 mLs by mouth every 6 (six) hours as needed for cough. 06/05/16   Charlesetta Shanks, MD  ipratropium-albuterol (DUONEB) 0.5-2.5 (3) MG/3ML SOLN Take 3 mLs by nebulization every 6 (six) hours as needed. Patient not taking: Reported on 06/05/2016 05/31/14   Thao P Le, DO  predniSONE (DELTASONE) 10 MG tablet Take 6 tabs for 1 day, 5 tabs for 1 day, 4 tabs for 1 day, 3 tabs for 1 day, 2 tabs for 1 day, 1 tab for 1 day Patient not taking: Reported on 06/05/2016 03/13/16   Forrest Moron, MD  Spacer/Aero-Holding Chambers ([place]) inhaler Use as instructed 06/05/16   Charlesetta Shanks, MD  traMADol (ULTRAM) 50 MG tablet TAKE 1 TABLET BY MOUTH EVERY 6 HOURS AS NEEDED Patient not taking: Reported on 06/05/2016 04/02/16   Golden Circle, FNP    Family History Family History  Problem Relation Age of Onset  . Hypertension Mother   . Diabetes Sister   . Arthritis Other   . Diabetes Other   . Pancreatic cancer Maternal Uncle   . Diabetes Maternal Grandmother   . Ovarian cancer Maternal Grandmother   . Colon cancer Maternal Grandmother   . Esophageal cancer Neg Hx   . Stomach cancer Neg Hx   . Rectal cancer Neg Hx     Social History Social History  Substance Use Topics  . Smoking status: Current Every Day Smoker    Packs/day: 0.40    Years: 7.00    Types: Cigarettes  . Smokeless tobacco: Never Used     Comment: Married 2009-lives with spouse, no kids  . Alcohol use No     Allergies   Naproxen   Review of Systems Review of Systems 10 Systems reviewed and are negative for acute change except as noted in the HPI.   Physical Exam Updated Vital Signs BP 126/94 (BP Location: Right Arm)   Pulse 83   Temp 98.6 F (37 C) (Oral)   Resp 18   Ht 5\' 2"  (1.575 m)   Wt 215 lb (97.5 kg)    SpO2 97%   BMI 39.32 kg/m   Physical Exam  Constitutional: She appears well-developed and well-nourished. No distress.  HENT:  Head: Normocephalic and atraumatic.  Mouth/Throat: Oropharynx is clear and moist.  Bilateral TMs normal.  Eyes: Conjunctivae and EOM are normal.  Neck: Neck supple.  Cardiovascular: Normal rate and regular rhythm.   No murmur heard. Pulmonary/Chest: Effort normal  and breath sounds normal. No respiratory distress.  Patient has intermittent dry cough.  Abdominal: Soft. There is no tenderness.  Musculoskeletal: She exhibits no edema.  Neurological: She is alert.  Skin: Skin is warm and dry.  Psychiatric: She has a normal mood and affect.  Nursing note and vitals reviewed.    ED Treatments / Results  Labs (all labs ordered are listed, but only abnormal results are displayed) Labs Reviewed - No data to display  EKG  EKG Interpretation None       Radiology Dg Chest 2 View  Result Date: 06/05/2016 CLINICAL DATA:  Fever and productive cough for 4 days. EXAM: CHEST  2 VIEW COMPARISON:  PA and lateral chest 03/13/2016 and 05/31/2014. FINDINGS: The lungs are clear. Heart size is normal. No pneumothorax or pleural effusion. No bony abnormality. IMPRESSION: Negative chest. Electronically Signed   By: Inge Rise M.D.   On: 06/05/2016 15:06    Procedures Procedures (including critical care time)  Medications Ordered in ED Medications  HYDROcodone-homatropine (HYCODAN) 5-1.5 MG/5ML syrup 10 mL (10 mLs Oral Given 06/05/16 1445)  acetaminophen (TYLENOL) tablet 1,000 mg (1,000 mg Oral Given 06/05/16 1444)     Initial Impression / Assessment and Plan / ED Course  I have reviewed the triage vital signs and the nursing notes.  Pertinent labs & imaging results that were available during my care of the patient were reviewed by me and considered in my medical decision making (see chart for details).       Final Clinical Impressions(s) / ED Diagnoses    Final diagnoses:  Influenza-like illness  Chest x-ray does not show any secondary pneumonia. Patient is alert and nontoxic without respiratory distress. She does have paroxysmal cough. Symptoms are consistent with influenza. She will be treated symptomatically with acetaminophen, albuterol inhaler Hycodan syrup. Patient is counseled on signs and symptoms were to return.  New Prescriptions New Prescriptions   ACETAMINOPHEN (TYLENOL) 500 MG TABLET    Take 2 tablets (1,000 mg total) by mouth every 6 (six) hours as needed.   ALBUTEROL (PROVENTIL HFA;VENTOLIN HFA) 108 (90 BASE) MCG/ACT INHALER    Inhale 1-2 puffs into the lungs every 6 (six) hours as needed for wheezing or shortness of breath.   HYDROCODONE-HOMATROPINE (HYCODAN) 5-1.5 MG/5ML SYRUP    Take 10 mLs by mouth every 6 (six) hours as needed for cough.   SPACER/AERO-HOLDING CHAMBERS (AEROCHAMBER PLUS WITH MASK) INHALER    Use as instructed     Charlesetta Shanks, MD 06/05/16 1534

## 2016-06-05 NOTE — ED Triage Notes (Signed)
Patient states she has had a fever and productive cough for 4 days.  She is dizzy and light head.  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  She states she has sinus pressure in head.

## 2016-06-25 ENCOUNTER — Other Ambulatory Visit: Payer: Self-pay | Admitting: Family

## 2016-06-25 DIAGNOSIS — I10 Essential (primary) hypertension: Secondary | ICD-10-CM

## 2016-06-26 MED FILL — LISINOPRIL-HCTZ 20-25 MG TA: 20-25 | 90 days supply | Qty: 90 | Fill #0

## 2016-08-01 ENCOUNTER — Ambulatory Visit: Payer: 59 | Admitting: Obstetrics and Gynecology

## 2016-08-01 ENCOUNTER — Encounter: Payer: Self-pay | Admitting: Obstetrics and Gynecology

## 2016-08-20 ENCOUNTER — Other Ambulatory Visit: Payer: Self-pay | Admitting: Family

## 2016-08-20 DIAGNOSIS — G479 Sleep disorder, unspecified: Secondary | ICD-10-CM

## 2016-08-20 DIAGNOSIS — F411 Generalized anxiety disorder: Secondary | ICD-10-CM

## 2016-08-21 ENCOUNTER — Other Ambulatory Visit: Payer: Self-pay | Admitting: Family

## 2016-08-21 DIAGNOSIS — F411 Generalized anxiety disorder: Secondary | ICD-10-CM

## 2016-08-21 DIAGNOSIS — G479 Sleep disorder, unspecified: Secondary | ICD-10-CM

## 2016-08-22 ENCOUNTER — Other Ambulatory Visit: Payer: Self-pay | Admitting: Family

## 2016-08-22 DIAGNOSIS — F411 Generalized anxiety disorder: Secondary | ICD-10-CM

## 2016-08-22 DIAGNOSIS — G479 Sleep disorder, unspecified: Secondary | ICD-10-CM

## 2016-09-25 MED FILL — ACETAMINOPHEN/COD #3 TABLET: 300-30 | 3 days supply | Qty: 12 | Fill #0

## 2016-10-12 ENCOUNTER — Other Ambulatory Visit: Payer: Self-pay | Admitting: *Deleted

## 2016-10-12 DIAGNOSIS — I10 Essential (primary) hypertension: Secondary | ICD-10-CM

## 2016-10-12 MED ORDER — LISINOPRIL-HYDROCHLOROTHIAZIDE 20-25 MG PO TABS: 1.0000 | ORAL_TABLET | Freq: Every day | ORAL | 0 refills | Status: DC

## 2016-10-12 MED FILL — MONTELUKAST SOD 10 MG TAB: 10 | 30 days supply | Qty: 30 | Fill #2

## 2016-10-12 MED FILL — LISINOPRIL-HCTZ 20-25 MG TA: 20-25 | 90 days supply | Qty: 90 | Fill #1

## 2016-10-12 NOTE — Telephone Encounter (Signed)
Rec'd call pt states she is needing refill on her Lisinopril. She has made appt for 6/19, but needing refill bcz she is out. Per office policy will send 30 day to local pharmacy until appt...Wendy Carr

## 2016-10-23 ENCOUNTER — Encounter: Payer: 59 | Admitting: Family

## 2016-11-06 ENCOUNTER — Other Ambulatory Visit: Payer: Self-pay | Admitting: Family

## 2017-01-30 MED FILL — LISINOPRIL-HCTZ 20-25 MG TA: 20-25 | 30 days supply | Qty: 30 | Fill #0

## 2017-03-22 IMAGING — CR DG KNEE COMPLETE 4+V*L*
5 series · 5 of 5 positions shown · non-contrast
Comparison: No recent prior.

CLINICAL DATA: Fall.  Pain.  Initial evaluation .

EXAM:
LEFT KNEE - COMPLETE 4+ VIEW

[AP]
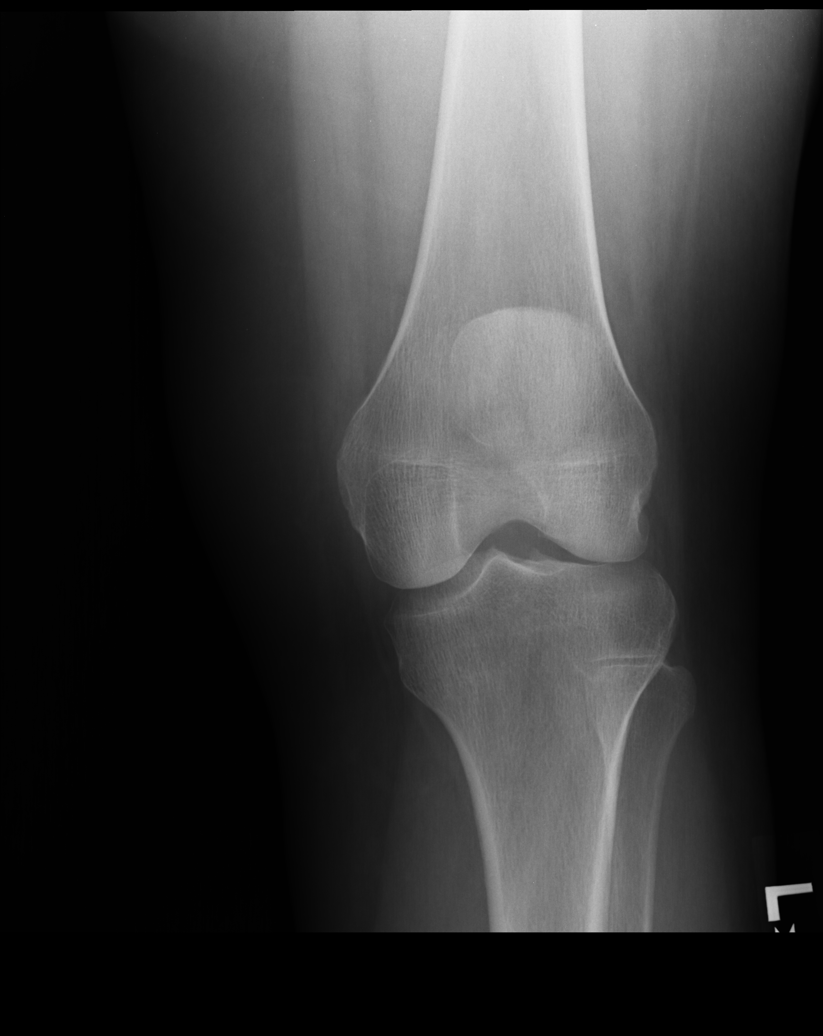

[ap axial (1 of 2)]
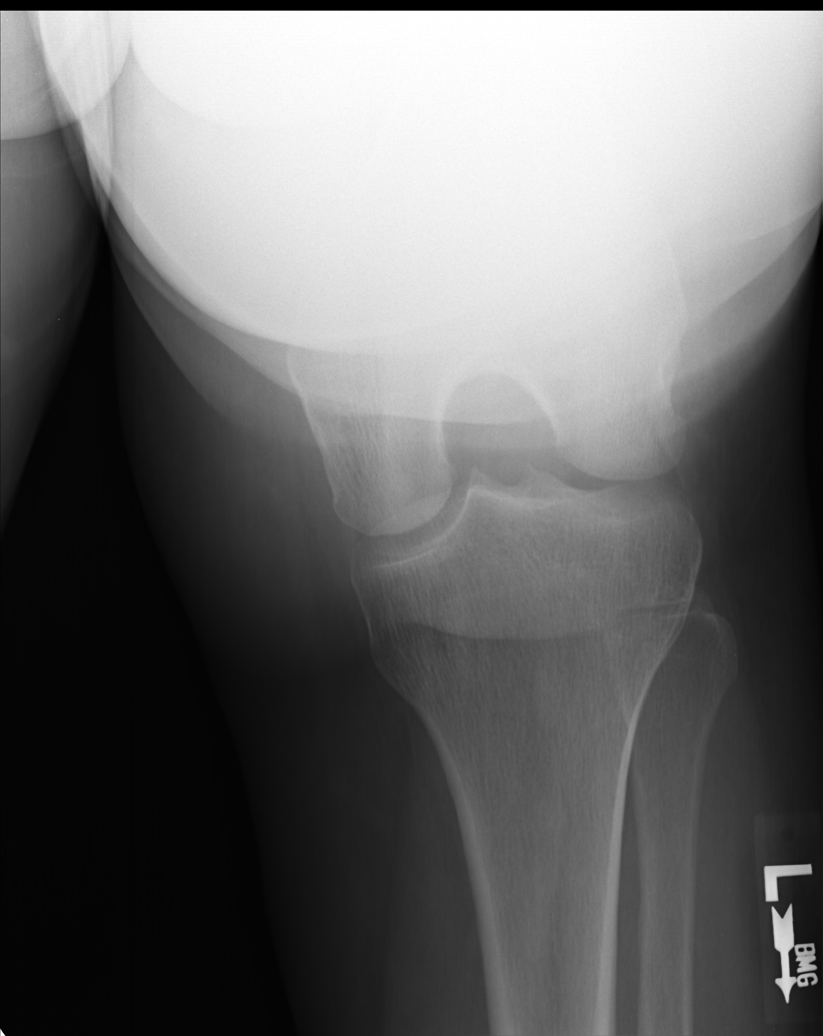

[lateral]
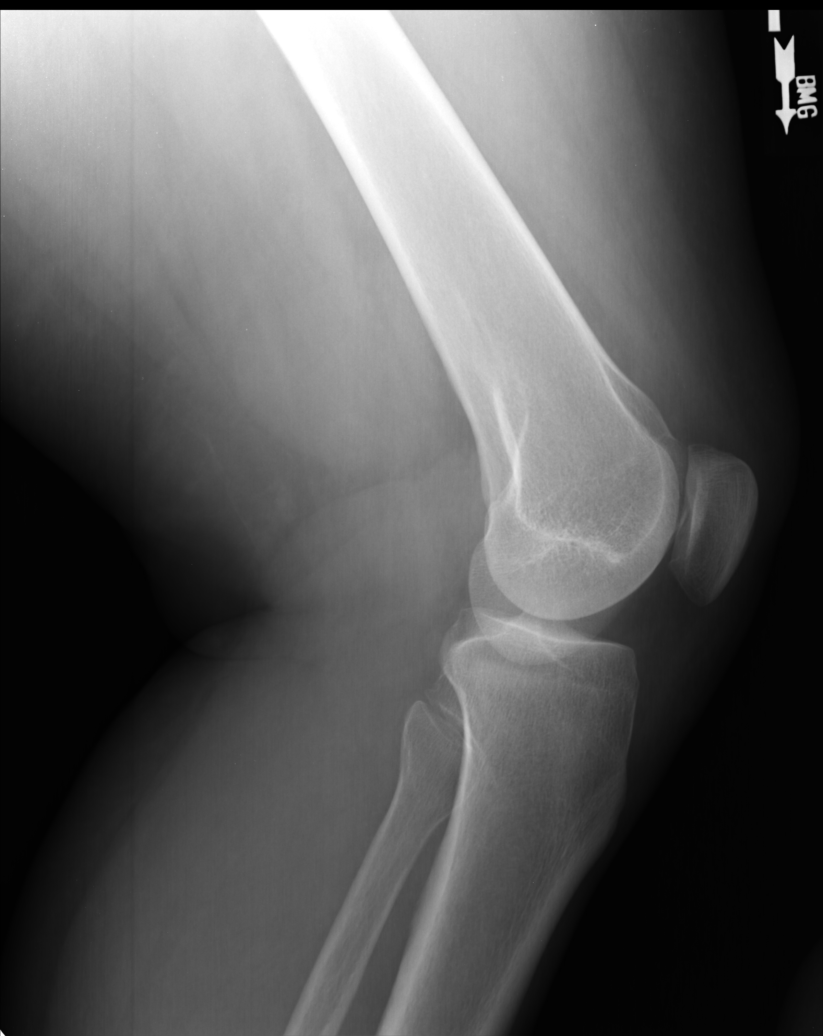

[sunrise]
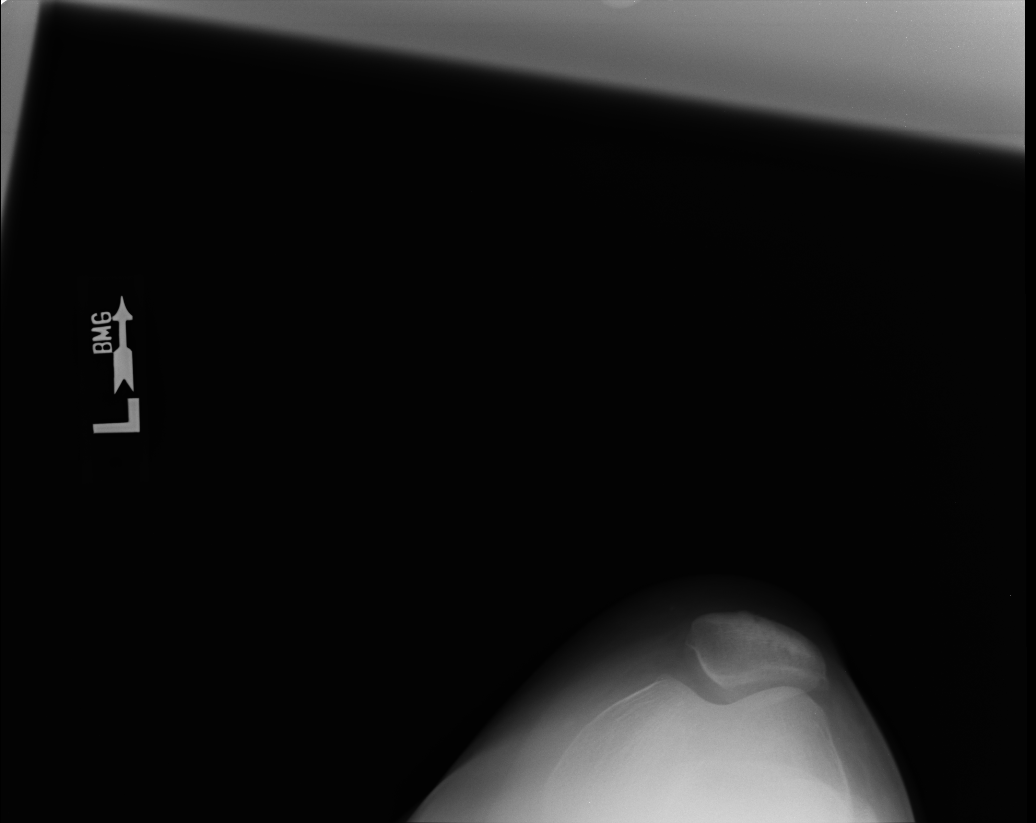

[ap axial (2 of 2)]
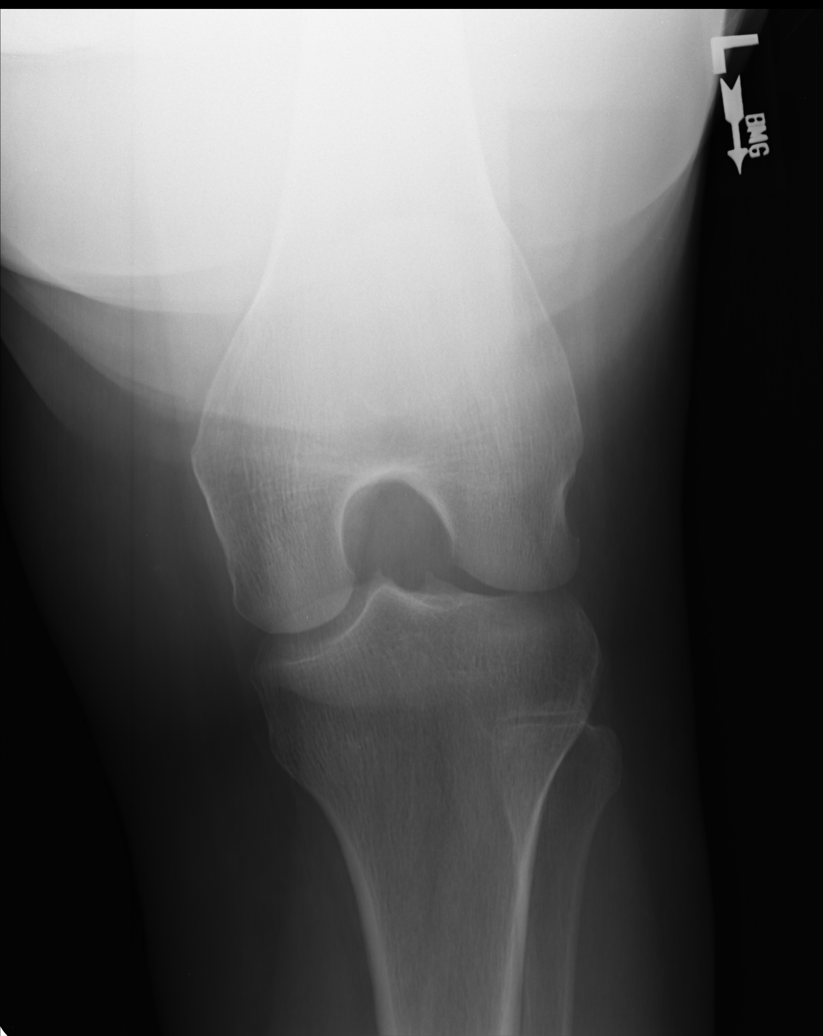

[5 of 5 positions shown; findings below may reference images not displayed]

FINDINGS: There is no evidence of fracture, dislocation, or joint effusion.
There is no evidence of arthropathy or other focal bone abnormality.
Soft tissues are unremarkable.
IMPRESSION: No acute abnormality.

## 2017-03-29 IMAGING — CR DG RIBS W/ CHEST 3+V*L*
4 series · 4 of 4 positions shown · non-contrast
Comparison: Chest x-ray 05/25/2015

CLINICAL DATA: Rib pain of unknown origin.

EXAM:
LEFT RIBS AND CHEST - 3+ VIEW

[PA]
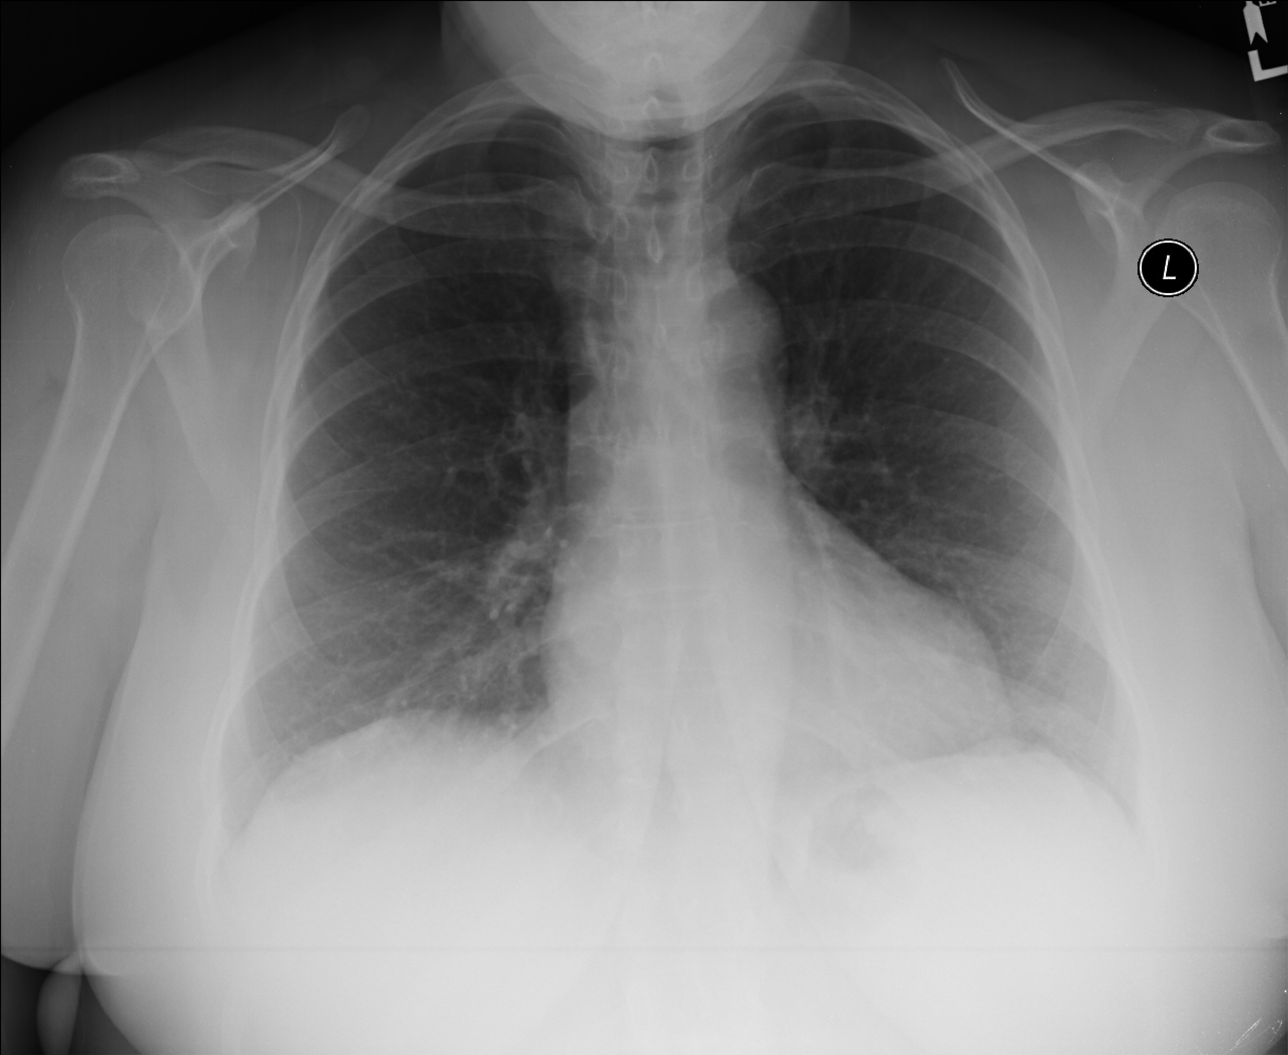

[rao]
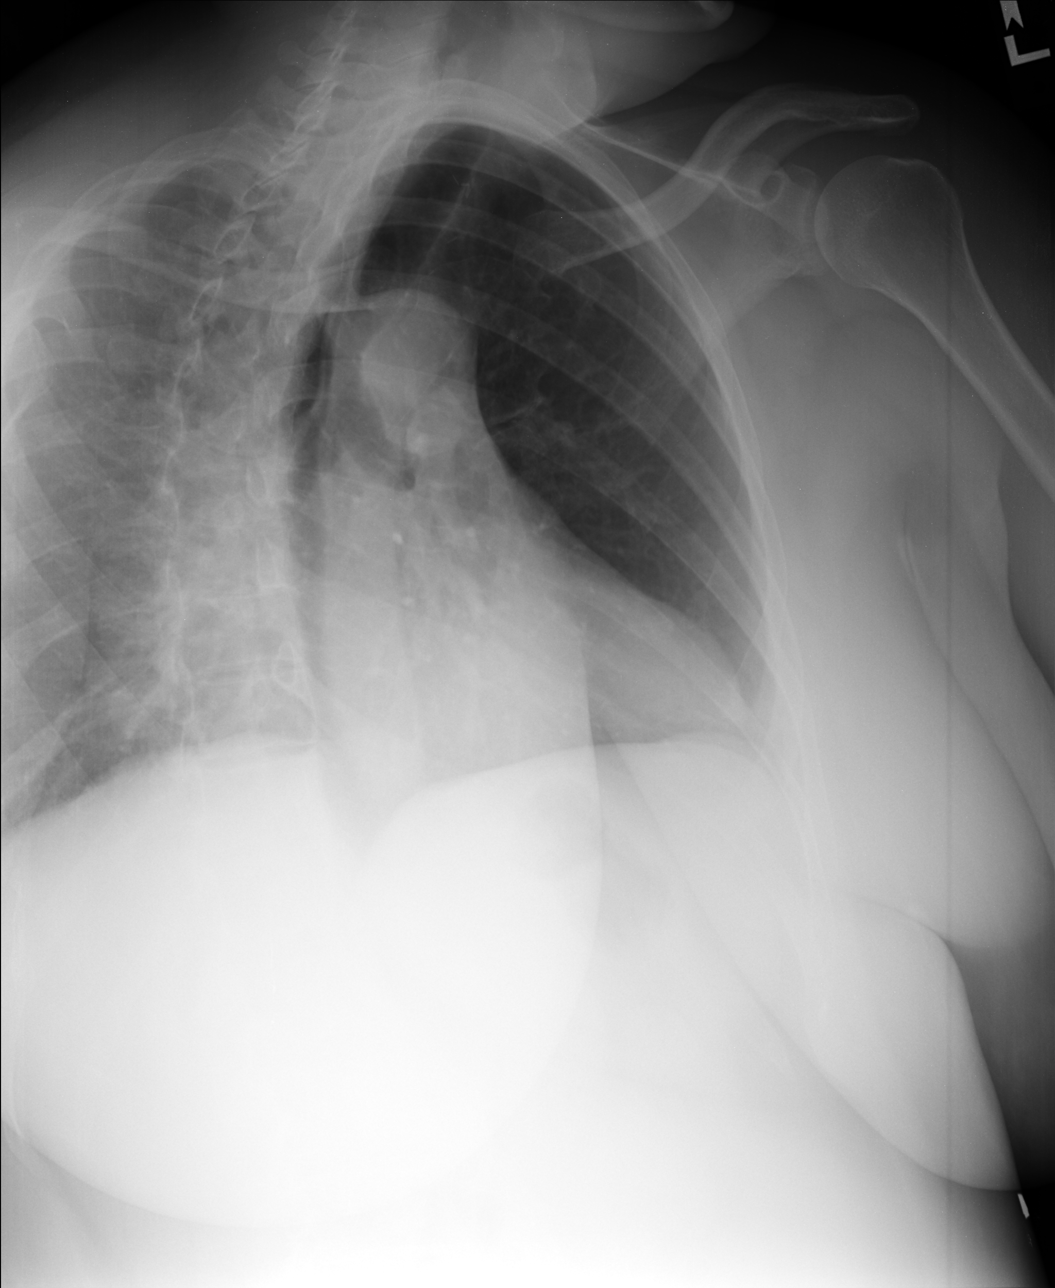

[lao]
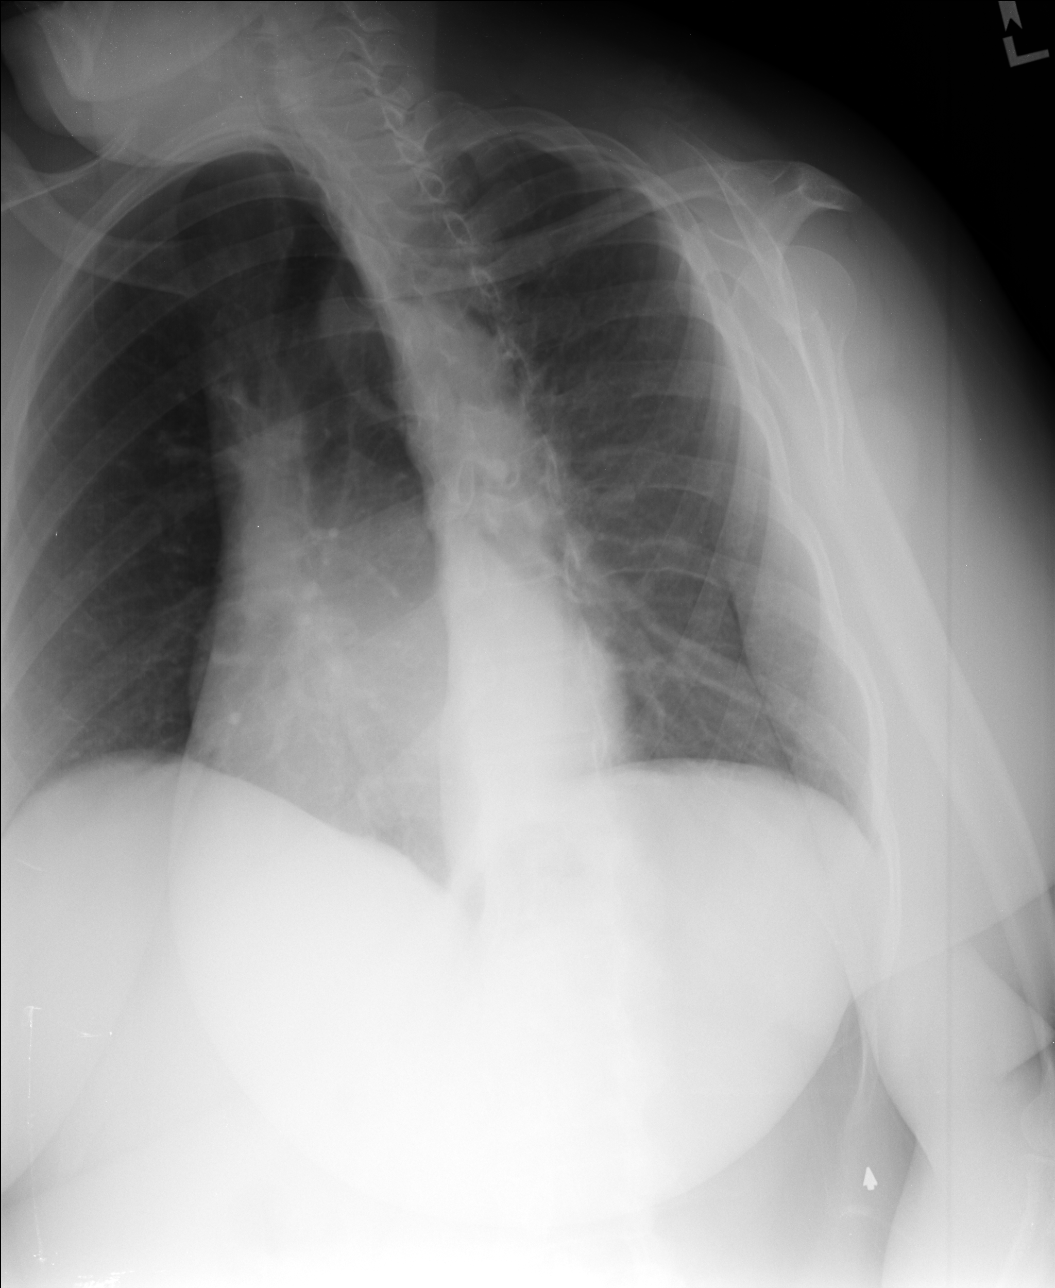

[AP]
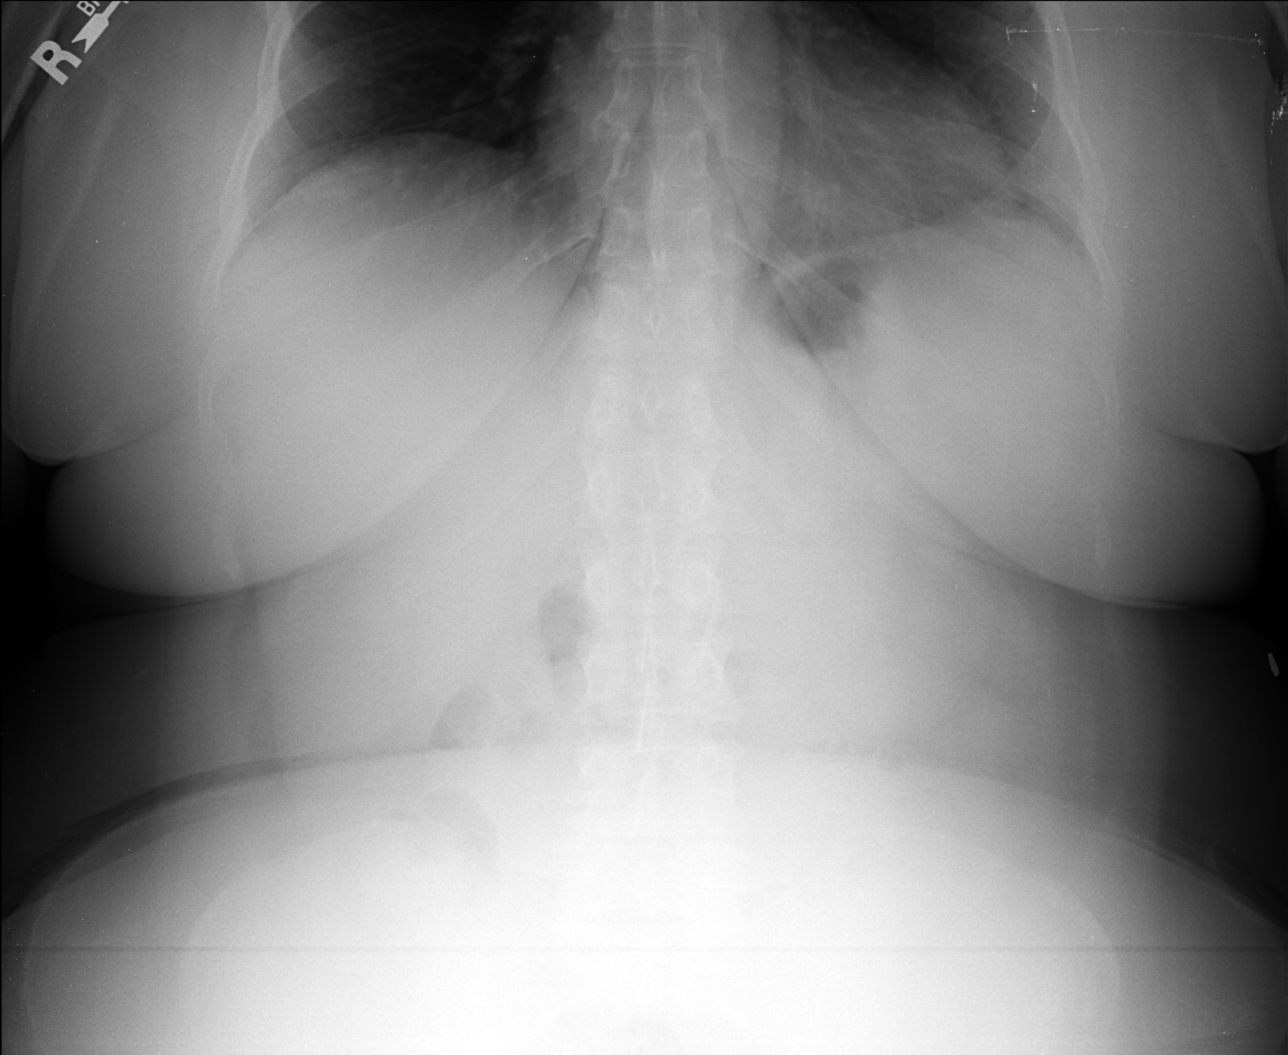

[4 of 4 positions shown; findings below may reference images not displayed]

FINDINGS: No fracture or other bone lesions are seen involving the ribs. There
is no evidence of pneumothorax or pleural effusion. Both lungs are
clear. Heart size and mediastinal contours are within normal limits.
IMPRESSION: Negative.

## 2017-04-15 ENCOUNTER — Telehealth: Payer: Self-pay | Admitting: Family

## 2017-04-15 ENCOUNTER — Other Ambulatory Visit: Payer: Self-pay | Admitting: Internal Medicine

## 2017-04-15 DIAGNOSIS — I10 Essential (primary) hypertension: Secondary | ICD-10-CM

## 2017-04-15 MED ORDER — LISINOPRIL-HYDROCHLOROTHIAZIDE 20-25 MG PO TABS: 1.0000 | ORAL_TABLET | Freq: Every day | ORAL | 0 refills | Status: DC

## 2017-04-15 MED FILL — LISINOPRIL-HCTZ 20-25 MG TA: 20-25 | 10 days supply | Qty: 10 | Fill #0

## 2017-04-15 NOTE — Telephone Encounter (Signed)
Call sent to [facility] provider. Pt has not been seen in 18 months. Needs visit with PCP. 10 pills approved by On Call provider with instructions for PCP visit. Unsure if PCP is [facility] or [facility].

## 2017-04-15 NOTE — Telephone Encounter (Signed)
Patient Name: Wendy Carr  DOB: February 22, 1956    Initial Comment Caller is needing prescription refill for blood pressure. She is feeling a little light headed.    Nurse Assessment  Nurse: Rene Kocher, RN, [place] Date/Time (Eastern Time): 04/15/2017 3:38:35 PM  Confirm and document reason for call. If symptomatic, describe symptoms. ---caller states she is having some light headedness. denies need for triage. she needs refill of Lisinopril. pt ran out of medication over the wkend and pt unable to obtain loaner dose from pharmacy  Does the patient have any new or worsening symptoms? ---No  Please document clinical information provided and list any resource used. ---denies need for triage    Nurse: Rene Kocher, RN, [place] Date/Time (Eastern Time): 04/15/2017 3:40:39 PM  Please select the assessment type ---Refill  Does the patient have enough medication to last until the office opens? ---Unable to obtain loaner dose from Pharmacy  Does the client directives allow for assistance with medications after hours? ---Yes  Was the medication filled within the last 6 months? ---Yes  What is the name of the medication, dose and instructions as listed on the bottle? ---Lisinopril, 1 pill daily  Name of the physician as listed on the bottle. ---[facility] name and phone number where most recently filled. ---Outpatient on [address]     Guidelines    Guideline Title Affirmed Question Affirmed Notes  Medication Question Call [1] Request for URGENT new prescription or refill of "essential" medication (i.e., likelihood of harm to patient if not taken) AND [2] triager unable to fill per unit policy    Final Disposition User   Call PCP Now Rene Kocher, RN, [place]    Referrals  REFERRED TO PCP OFFICE   Caller Disagree/Comply Comply  Caller Understands Yes  PreDisposition InappropriateToAsk

## 2017-04-15 NOTE — Telephone Encounter (Signed)
Please call when we re-open to see if she wants to re-establish.

## 2017-04-15 NOTE — Progress Notes (Signed)
REFILL REQUESTED DURING SNOW DAY,  REFILLED FOR TEN DAYS . HAS NOT HAD CMET SINCE January 2017.

## 2017-04-17 MED ORDER — LISINOPRIL-HYDROCHLOROTHIAZIDE 20-25 MG PO TABS: 1.0000 | ORAL_TABLET | Freq: Every day | ORAL | 0 refills | Status: DC

## 2017-04-17 NOTE — Telephone Encounter (Signed)
Rx refilled for 30 day supply. Pt is aware.

## 2017-04-17 NOTE — Telephone Encounter (Signed)
Pt has called back in. She only has a week of BP medicine and is needing to establish care with another provider to get this refilled asap.

## 2017-04-17 NOTE — Telephone Encounter (Signed)
I have scheduled patient for CPE and transfer to [facility] for 06/19/17 at 8am.  Please advise on medication.

## 2017-04-26 MED FILL — LISINOPRIL-HCTZ 20-25 MG TA: 20-25 | 30 days supply | Qty: 30 | Fill #0

## 2017-05-24 ENCOUNTER — Telehealth: Payer: Self-pay | Admitting: Family

## 2017-05-24 DIAGNOSIS — I10 Essential (primary) hypertension: Secondary | ICD-10-CM

## 2017-05-24 MED ORDER — LISINOPRIL-HYDROCHLOROTHIAZIDE 20-25 MG PO TABS: 1.0000 | ORAL_TABLET | Freq: Every day | ORAL | 0 refills | Status: DC

## 2017-05-24 MED FILL — LISINOPRIL-HCTZ 20-25 MG TA: 20-25 | 30 days supply | Qty: 30 | Fill #0

## 2017-05-24 NOTE — Telephone Encounter (Signed)
Spoke with Gareth Eagle at [facility] regarding this request; she will forward to provider and contact pt.

## 2017-05-24 NOTE — Telephone Encounter (Signed)
LVM letting pt know meds have been sent.

## 2017-05-24 NOTE — Telephone Encounter (Signed)
Copied from [place] [id number]. Topic: Quick Communication - Rx Refill/Question >> May 24, 2017  1:56 PM Wendy Carr A wrote: Medication:  lisinopril-hydrochlorothiazide (PRINZIDE,ZESTORETIC) 20-25 MG tablet [[id number]]    Has the patient contacted their pharmacy? No    (Agent: If no, request that the patient contact the pharmacy for the refill.)   Preferred Pharmacy (with phone number or street name): pt is transfering on the 13th needs enough meds to make it to that day.  She is going out of town today and need request that it be done today .  Pharmacy  [facility]    Agent: Please be advised that RX refills may take up to 3 business days. We ask that you follow-up with your pharmacy.

## 2017-06-19 ENCOUNTER — Encounter: Payer: Self-pay | Admitting: Nurse Practitioner

## 2017-06-19 ENCOUNTER — Other Ambulatory Visit (INDEPENDENT_AMBULATORY_CARE_PROVIDER_SITE_OTHER): Payer: 59

## 2017-06-19 ENCOUNTER — Ambulatory Visit (INDEPENDENT_AMBULATORY_CARE_PROVIDER_SITE_OTHER): Payer: 59 | Admitting: Nurse Practitioner

## 2017-06-19 VITALS — BP 134/80 | HR 67 | Temp 98.9°F | Resp 16 | Ht 62.0 in | Wt 205.8 lb

## 2017-06-19 DIAGNOSIS — E079 Disorder of thyroid, unspecified: Secondary | ICD-10-CM

## 2017-06-19 DIAGNOSIS — I10 Essential (primary) hypertension: Secondary | ICD-10-CM | POA: Diagnosis not present

## 2017-06-19 DIAGNOSIS — R05 Cough: Secondary | ICD-10-CM | POA: Diagnosis not present

## 2017-06-19 DIAGNOSIS — F419 Anxiety disorder, unspecified: Secondary | ICD-10-CM | POA: Diagnosis not present

## 2017-06-19 DIAGNOSIS — J4540 Moderate persistent asthma, uncomplicated: Secondary | ICD-10-CM

## 2017-06-19 DIAGNOSIS — Z23 Encounter for immunization: Secondary | ICD-10-CM | POA: Diagnosis not present

## 2017-06-19 DIAGNOSIS — G479 Sleep disorder, unspecified: Secondary | ICD-10-CM | POA: Diagnosis not present

## 2017-06-19 DIAGNOSIS — M1990 Unspecified osteoarthritis, unspecified site: Secondary | ICD-10-CM

## 2017-06-19 DIAGNOSIS — F32A Depression, unspecified: Secondary | ICD-10-CM

## 2017-06-19 DIAGNOSIS — F329 Major depressive disorder, single episode, unspecified: Secondary | ICD-10-CM

## 2017-06-19 DIAGNOSIS — R059 Cough, unspecified: Secondary | ICD-10-CM

## 2017-06-19 LAB — COMPREHENSIVE METABOLIC PANEL
ALT: 37 U/L — AB (ref 0–35)
AST: 34 U/L (ref 0–37)
Albumin: 4 g/dL (ref 3.5–5.2)
Alkaline Phosphatase: 105 U/L (ref 39–117)
BILIRUBIN TOTAL: 0.3 mg/dL (ref 0.2–1.2)
BUN: 18 mg/dL (ref 6–23)
CALCIUM: 9.4 mg/dL (ref 8.4–10.5)
CO2: 28 meq/L (ref 19–32)
Chloride: 105 mEq/L (ref 96–112)
Creatinine, Ser: 0.89 mg/dL (ref 0.40–1.20)
GFR: 83.19 mL/min (ref >60.00)
Glucose, Bld: 113 mg/dL — ABNORMAL HIGH (ref 70–99)
Potassium: 4.7 mEq/L (ref 3.5–5.1)
Sodium: 139 mEq/L (ref 135–145)
Total Protein: 7.7 g/dL (ref 6.0–8.3)

## 2017-06-19 LAB — TSH: TSH: 1.27 u[IU]/mL (ref 0.35–4.50)

## 2017-06-19 MED ORDER — CITALOPRAM HYDROBROMIDE 40 MG PO TABS: 40.0000 mg | ORAL_TABLET | Freq: Every day | ORAL | 3 refills | Status: DC

## 2017-06-19 MED ORDER — ALBUTEROL SULFATE HFA 108 (90 BASE) MCG/ACT IN AERS: 1.0000 | INHALATION_SPRAY | Freq: Four times a day (QID) | RESPIRATORY_TRACT | 2 refills | Status: AC | PRN

## 2017-06-19 MED ORDER — BUSPIRONE HCL 10 MG PO TABS: 10.0000 mg | ORAL_TABLET | Freq: Two times a day (BID) | ORAL | 1 refills | Status: DC

## 2017-06-19 MED ORDER — BUDESONIDE-FORMOTEROL FUMARATE 80-4.5 MCG/ACT IN AERO: 1.0000 | INHALATION_SPRAY | Freq: Two times a day (BID) | RESPIRATORY_TRACT | 3 refills | Status: DC

## 2017-06-19 MED ORDER — LISINOPRIL-HYDROCHLOROTHIAZIDE 20-25 MG PO TABS: 1.0000 | ORAL_TABLET | Freq: Every day | ORAL | 1 refills | Status: DC

## 2017-06-19 MED ORDER — TRAZODONE HCL 50 MG PO TABS: 50.0000 mg | ORAL_TABLET | Freq: Every day | ORAL | 3 refills | Status: DC

## 2017-06-19 MED FILL — busPIRone HCL 10 MG TABS: 10 | 30 days supply | Qty: 60 | Fill #0

## 2017-06-19 MED FILL — SYMBICORT 80-4.5 MCG INH: 80-4.5 | 60 days supply | Qty: 10 | Fill #0

## 2017-06-19 MED FILL — LISINOPRIL-HCTZ 20-25 MG TA: 20-25 | 90 days supply | Qty: 90 | Fill #0

## 2017-06-19 MED FILL — VENTOLIN HFA 90 MCG INHALER: 108 (90 BAS | 25 days supply | Qty: 18 | Fill #0

## 2017-06-19 MED FILL — traZODone HCL 50 MG TABS: 50 | 30 days supply | Qty: 30 | Fill #0

## 2017-06-19 MED FILL — CITALOPRAM HBR 40 MG TABLET: 40 | 90 days supply | Qty: 90 | Fill #0

## 2017-06-19 NOTE — Progress Notes (Signed)
Name: Wendy Carr   MRN: [id number]    DOB: [birth year]   Date:06/19/2017       Progress Note  Subjective  Chief Complaint  Chief Complaint  Patient presents with  . Establish Care    Medication refills on everything    HPI Wendy Carr is establishing care with me today.. She says that she has run out of all of her medications for at least one week.  Asthma-  Maintained on albuterol, qvar She reports using her qvar daily and albuterol about 3-4 times a week, sometimes more often if she is having allergies or a cold. She denies fevers, shortness of breath. She does report recent cough, runny nose for the past few days but denies fevers or feeling ill.  Anxiety and depression- Maintained on celexa, buspar, xanax She reports shes had anxiety and depression for years. She denies restlessness, hallucinations, thoughts of hurting herself or others She works at a daycare which causes frequent stress She does not want to go to counseling.  Depression screen [facility] 2/9 06/19/2017 03/13/2016 07/07/2015  Decreased Interest 2 0 0  Down, Depressed, Hopeless 2 0 0  PHQ - 2 Score 4 0 0  Altered sleeping 3 - -  Tired, decreased energy 2 - -  Change in appetite 2 - -  Feeling bad or failure about yourself  2 - -  Trouble concentrating 2 - -  Moving slowly or fidgety/restless 2 - -  Suicidal thoughts 0 - -  PHQ-9 Score 17 - -   GAD 7 : Generalized Anxiety Score 06/19/2017  Nervous, Anxious, on Edge 2  Control/stop worrying 3  Worry too much - different things 3  Trouble relaxing 2  Restless 2  Easily annoyed or irritable 2  Afraid - awful might happen 1  Total GAD 7 Score 15    Thyroid dysfunction- post partial thyroidectomy  She says she was maintained on synthroid in the past but told to stop taking the medication last year by a prior provider due to normal TSH levels. Denies fatigue, weight gain, decreased appetite, intolerance to hot or cold, palpitations, dry skin.  Lab Results   Component Value Date   TSH 1.27 06/19/2017    Hypertension -maintained on lisinopril HCTZ Reports daily medication compliance without adverse medication effects. She does not report home BP readings today. Denies headaches, vision changes, chest pain, edema.  BP Readings from Last 3 Encounters:  06/19/17 134/80  06/05/16 126/94  03/13/16 118/80   Insomnia- This is a chronic problem She has been maintained on trazodone which also helps her anxiety and depression. She says she tolerates trazodone well without adverse effects including drowsiness, disorientation She has only been sleeping about 4 hours a night since she ran out of her trazodone She tried melatonin which did not help. She has been out trazodone about 3 months and had trouble slepeing since Tried melatonin with no relief  Arthritis- She c/o chronic pain related to arthritis in her feet and back. Her pain is daily and worse when waking in the morning. She feels stiff and achy every day. Her pain is not improved with ibuprofen, goody powder She does try to stay active and exercise daily which helps her pain.  She has not been to orthopedic or specialty for her pain. She has been taking tramadol for her pain and requests refill today  Patient Active Problem List   Diagnosis Date Noted  . Thyroid dysfunction 06/19/2017  . Routine general medical examination at  a health care facility 07/07/2015  . Sleep disturbance 07/07/2015  . Arthritis 03/30/2014  . Opiate withdrawal ([place]) 12/16/2011  . Cocaine abuse ([place]) 12/16/2011  . Backache 09/03/2008  . HYPERGLYCEMIA, BORDERLINE 09/03/2008  . ANEMIA-NOS 08/20/2008  . Anxiety and depression 08/20/2008  . Essential hypertension 08/20/2008  . ALLERGIC RHINITIS 08/20/2008  . Asthma 08/20/2008  . GERD 08/20/2008    Past Surgical History:  Procedure Laterality Date  . BREAST SURGERY  2004   Lumpectomy, right side-Benign  . COLONOSCOPY    . DILATION AND CURETTAGE OF  UTERUS    . FOOT SURGERY  2006   right  . [place] ?[place]  . MYOMECTOMY  1997  . THYROIDECTOMY, PARTIAL  2004   Left, benign  . VEIN SURGERY  1997   left arm    Family History  Problem Relation Age of Onset  . Hypertension Mother   . Diabetes Sister   . Arthritis Other   . Diabetes Other   . Pancreatic cancer Maternal Uncle   . Diabetes Maternal Grandmother   . Ovarian cancer Maternal Grandmother   . Colon cancer Maternal Grandmother   . Esophageal cancer Neg Hx   . Stomach cancer Neg Hx   . Rectal cancer Neg Hx     Social History   Socioeconomic History  . Marital status: Married    Spouse name: Not on file  . Number of children: 0  . Years of education: 42  . Highest education level: Not on file  Social Needs  . Financial resource strain: Not on file  . Food insecurity - worry: Not on file  . Food insecurity - inability: Not on file  . Transportation needs - medical: Not on file  . Transportation needs - non-medical: Not on file  Occupational History  . Not on file  Tobacco Use  . Smoking status: Current Every Day Smoker    Packs/day: 0.40    Years: 7.00    Pack years: 2.80    Types: Cigarettes  . Smokeless tobacco: Never Used  . Tobacco comment: Married 2009-lives with spouse, no kids  Substance and Sexual Activity  . Alcohol use: No    Alcohol/week: 0.0 oz  . Drug use: Yes    Types: Cocaine    Comment: Last time usage was December 2016 - heroin and cocaine  . Sexual activity: Yes    Partners: Male    Birth control/protection: Abstinence  Other Topics Concern  . Not on file  Social History Narrative   Denies abuse and feels safe at home.      Current Outpatient Medications:  .  acetaminophen (TYLENOL) 500 MG tablet, Take 2 tablets (1,000 mg total) by mouth every 6 (six) hours as needed., Disp: 30 tablet, Rfl: 0 .  albuterol (PROVENTIL HFA;VENTOLIN HFA) 108 (90 Base) MCG/ACT inhaler, Inhale 1-2 puffs into the lungs every 6  (six) hours as needed for wheezing or shortness of breath., Disp: 1 Inhaler, Rfl: 2 .  busPIRone (BUSPAR) 10 MG tablet, Take 1 tablet (10 mg total) by mouth 2 (two) times daily., Disp: 60 tablet, Rfl: 1 .  citalopram (CELEXA) 40 MG tablet, Take 1 tablet (40 mg total) by mouth daily., Disp: 90 tablet, Rfl: 3 .  ipratropium-albuterol (DUONEB) 0.5-2.5 (3) MG/3ML SOLN, Take 3 mLs by nebulization every 6 (six) hours as needed., Disp: 360 mL, Rfl: 3 .  lisinopril-hydrochlorothiazide (PRINZIDE,ZESTORETIC) 20-25 MG tablet, Take 1 tablet by mouth daily., Disp: 90 tablet,  Rfl: 1 .  loratadine (CLARITIN) 10 MG tablet, Take 10 mg by mouth daily as needed for allergies., Disp: , Rfl:  .  montelukast (SINGULAIR) 10 MG tablet, Take 1 tablet (10 mg total) by mouth at bedtime., Disp: 30 tablet, Rfl: 11 .  Spacer/Aero-Holding Chambers (AEROCHAMBER PLUS WITH MASK) inhaler, Use as instructed, Disp: 1 each, Rfl: 2 .  traZODone (DESYREL) 50 MG tablet, Take 1 tablet (50 mg total) by mouth at bedtime., Disp: 30 tablet, Rfl: 3 .  vitamin B-12 (CYANOCOBALAMIN) 500 MCG tablet, Take 500 mcg by mouth daily., Disp: , Rfl:  .  budesonide-formoterol (SYMBICORT) 80-4.5 MCG/ACT inhaler, Inhale 1 puff into the lungs 2 (two) times daily., Disp: 1 Inhaler, Rfl: 3  Allergies  Allergen Reactions  . Naproxen Other (See Comments)    Stomach cramps      ROS See HPI  Objective  Vitals:   06/19/17 0805  BP: 134/80  Pulse: 67  Resp: 16  Temp: 98.9 F (37.2 C)  TempSrc: Oral  SpO2: 92%  Weight: 205 lb 12.8 oz (93.4 kg)  Height: 5\' 2"  (1.575 m)    Body mass index is 37.64 kg/m.  Physical Exam Vital signs reviewed. Constitutional: Patient appears well-developed and well-nourished. No distress.  HENT: Head: Normocephalic and atraumatic.Nose: Nose normal. Mouth/Throat: Oropharynx is clear and moist. No oropharyngeal exudate.  Eyes: Conjunctivae and EOM are normal. No scleral icterus.  Neck: Normal range of motion. Neck  supple. No thyromegaly present.  Cardiovascular: Normal rate, regular rhythm and normal heart sounds.  No murmur heard. No BLE edema. Distal pulses intact. Pulmonary/Chest: Effort normal and breath sounds normal. No respiratory distress. Abdominal: Soft. Bowel sounds are normal, no distension. Musculoskeletal: Normal range of motion, no joint effusions. No gross deformities Neurological: She is alert and oriented to person, place, and time. Coordination, balance, strength, speech and gait are normal.  Skin: Skin is warm and dry. No rash noted. No erythema.  Psychiatric: Patient has a normal mood and affect. behavior is normal. Judgment and thought content normal.  Assessment & Plan She requested refills of multiple controlled substances today including xanax, tramadol, and hydrocodone syrup, which she was prescribed by prior provider. We had a discussion about the risks of long term use of these medications including addiction and memory loss and that safer, more appropriate treatments for her chronic conditions were available. She has a history of substance abuse with past history of heroin and cocaine use, although she does not report using illegal substances today I would prefer not to prescribe these medications to her for her safety and I did make her aware of this. RTC in 1 month for f/u of asthma, anxiety and depression, insomnia  -Reviewed Health Maintenance:   Need for influenza vaccination- Flu Vaccine QUAD 36+ mos IM  Need for Tdap vaccination- Tdap vaccine greater than or equal to 7yo IM   Cough Instructed to use OTC delsym for cough  Return precautions discussed and printed on AVS

## 2017-06-19 NOTE — Patient Instructions (Addendum)
Please head downstairs for lab work.  Please resume your daily medications and return in about 1 month, I want to make sure you are doing okay back on your medications.  For your asthma, stop qvar. Start symbicort 2 puffs twice daily for 1 week then 1 puff twice daily. Continue to use your albuterol only as needed.  For your cough, please try delsym over the counter. Please follow up for fevers, shortness of breath, or if you start feeling worse.  For your anxiety and depression, please start your celexa back at 1/2 tablet once daily for a week, then you can increase to a full tablet on week 2.  Please set up a follow up with one of our sports medicine doctors for pain. Please try over the counter tylenol for you arthritis- do not take more than 3,000mg  a day. Remember to stay active and try to exercise some each day.  It was nice to meet you. Thanks for letting me take care of you today :)

## 2017-06-19 NOTE — Assessment & Plan Note (Signed)
Stable, continue meds - Comprehensive metabolic panel; Future - lisinopril-hydrochlorothiazide (PRINZIDE,ZESTORETIC) 20-25 MG tablet; Take 1 tablet by mouth daily.  Dispense: 90 tablet; Refill: 1

## 2017-06-19 NOTE — Assessment & Plan Note (Signed)
-   TSH; Future

## 2017-06-19 NOTE — Assessment & Plan Note (Signed)
Will resume buspar at lower dose and celexa at 1/2 dose for week 1 then increase back to full dose- dosage instructions discussed and printed in AVS Will not prescribe xanax due to long term medication risks and past history of substance abuse which we discussed during her visit Declines counseling - busPIRone (BUSPAR) 10 MG tablet; Take 1 tablet (10 mg total) by mouth 2 (two) times daily.  Dispense: 60 tablet; Refill: 1 - citalopram (CELEXA) 40 MG tablet; Take 1 tablet (40 mg total) by mouth daily.  Dispense: 90 tablet; Refill: 3 - traZODone (DESYREL) 50 MG tablet; Take 1 tablet (50 mg total) by mouth at bedtime.  Dispense: 30 tablet; Refill: 3 She will RTC in 1 month so we can see how she is doing back on medication - TSH; Future

## 2017-06-19 NOTE — Assessment & Plan Note (Signed)
Instructions to try tylenol and exercise for her pain Referral to sports med

## 2017-06-19 NOTE — Assessment & Plan Note (Signed)
Will resume trazodone - traZODone (DESYREL) 50 MG tablet; Take 1 tablet (50 mg total) by mouth at bedtime.  Dispense: 30 tablet; Refill: 3

## 2017-06-19 NOTE — Assessment & Plan Note (Signed)
Asthma not controlled on QVAR Will step up to low dose symbicort. Instructed to use 2 puffs BID on week 1 due to current cough, then decrease to 1 puff BID.  Instructed to continue albuterol prn Return precautions discussed and printed on AVS - budesonide-formoterol (SYMBICORT) 80-4.5 MCG/ACT inhaler; Inhale 1 puff into the lungs 2 (two) times daily.  Dispense: 1 Inhaler; Refill: 3 - albuterol (PROVENTIL HFA;VENTOLIN HFA) 108 (90 Base) MCG/ACT inhaler; Inhale 1-2 puffs into the lungs every 6 (six) hours as needed for wheezing or shortness of breath.  Dispense: 1 Inhaler; Refill: 2 She will return in about 1 month for follow up, or sooner if needed

## 2017-07-19 ENCOUNTER — Ambulatory Visit: Payer: Self-pay | Admitting: Nurse Practitioner

## 2017-08-10 ENCOUNTER — Emergency Department (HOSPITAL_COMMUNITY): Payer: 59

## 2017-08-10 ENCOUNTER — Other Ambulatory Visit: Payer: Self-pay

## 2017-08-10 ENCOUNTER — Encounter (HOSPITAL_COMMUNITY): Payer: Self-pay | Admitting: Emergency Medicine

## 2017-08-10 ENCOUNTER — Emergency Department (HOSPITAL_COMMUNITY)
Admission: EM | Admit: 2017-08-10 | Discharge: 2017-08-10 | Disposition: A | Discharge status: Home / Self Care | Payer: 59 | Attending: Emergency Medicine | Admitting: Emergency Medicine

## 2017-08-10 DIAGNOSIS — R0602 Shortness of breath: Secondary | ICD-10-CM | POA: Diagnosis not present

## 2017-08-10 DIAGNOSIS — J45901 Unspecified asthma with (acute) exacerbation: Secondary | ICD-10-CM | POA: Diagnosis not present

## 2017-08-10 DIAGNOSIS — Z79899 Other long term (current) drug therapy: Secondary | ICD-10-CM | POA: Diagnosis not present

## 2017-08-10 DIAGNOSIS — E039 Hypothyroidism, unspecified: Secondary | ICD-10-CM | POA: Diagnosis not present

## 2017-08-10 DIAGNOSIS — R05 Cough: Secondary | ICD-10-CM

## 2017-08-10 DIAGNOSIS — R079 Chest pain, unspecified: Secondary | ICD-10-CM | POA: Diagnosis not present

## 2017-08-10 DIAGNOSIS — I10 Essential (primary) hypertension: Secondary | ICD-10-CM | POA: Insufficient documentation

## 2017-08-10 DIAGNOSIS — R059 Cough, unspecified: Secondary | ICD-10-CM

## 2017-08-10 DIAGNOSIS — F17210 Nicotine dependence, cigarettes, uncomplicated: Secondary | ICD-10-CM | POA: Insufficient documentation

## 2017-08-10 LAB — BASIC METABOLIC PANEL
Anion gap: 8 (ref 5–15)
BUN: 16 mg/dL (ref 6–20)
CALCIUM: 8.8 mg/dL — AB (ref 8.9–10.3)
CO2: 22 mmol/L (ref 22–32)
CREATININE: 0.88 mg/dL (ref 0.44–1.00)
Chloride: 105 mmol/L (ref 101–111)
GFR calc Af Amer: >60 mL/min (ref >60)
GLUCOSE: 128 mg/dL — AB (ref 65–99)
Potassium: 3.6 mmol/L (ref 3.5–5.1)
SODIUM: 135 mmol/L (ref 135–145)

## 2017-08-10 LAB — I-STAT TROPONIN, ED: TROPONIN I, POC: 0 ng/mL (ref 0.00–0.08)

## 2017-08-10 LAB — CBC
HCT: 35.9 % — ABNORMAL LOW (ref 36.0–46.0)
Hemoglobin: 11.2 g/dL — ABNORMAL LOW (ref 12.0–15.0)
MCH: 26.9 pg (ref 26.0–34.0)
MCHC: 31.2 g/dL (ref 30.0–36.0)
MCV: 86.1 fL (ref 78.0–100.0)
PLATELETS: 196 10*3/uL (ref 150–400)
RBC: 4.17 MIL/uL (ref 3.87–5.11)
RDW: 15.4 % (ref 11.5–15.5)
WBC: 9.6 10*3/uL (ref 4.0–10.5)

## 2017-08-10 MED ORDER — PREDNISONE 50 MG PO TABS: 50.0000 mg | ORAL_TABLET | Freq: Every day | ORAL | 0 refills | Status: AC

## 2017-08-10 MED ORDER — ALBUTEROL SULFATE (2.5 MG/3ML) 0.083% IN NEBU
5.0000 mg | INHALATION_SOLUTION | Freq: Once | RESPIRATORY_TRACT | Status: AC
  Administered 2017-08-10: 5 mg via RESPIRATORY_TRACT
  Filled 2017-08-10: qty 6

## 2017-08-10 MED ORDER — ACETAMINOPHEN 325 MG PO TABS: 650.0000 mg | ORAL_TABLET | Freq: Once | ORAL | Status: DC

## 2017-08-10 MED ORDER — PREDNISONE 20 MG PO TABS
60.0000 mg | ORAL_TABLET | Freq: Once | ORAL | Status: AC
  Administered 2017-08-10: 60 mg via ORAL
  Filled 2017-08-10: qty 3

## 2017-08-10 MED ORDER — IPRATROPIUM-ALBUTEROL 0.5-2.5 (3) MG/3ML IN SOLN
3.0000 mL | Freq: Once | RESPIRATORY_TRACT | Status: AC
  Administered 2017-08-10: 3 mL via RESPIRATORY_TRACT
  Filled 2017-08-10: qty 3

## 2017-08-10 MED ORDER — GUAIFENESIN 100 MG/5ML PO SOLN
10.0000 mL | Freq: Once | ORAL | Status: AC
  Administered 2017-08-10: 200 mg via ORAL
  Filled 2017-08-10: qty 10

## 2017-08-10 MED ORDER — GUAIFENESIN 100 MG/5ML PO LIQD: 200.0000 mg | ORAL | 0 refills | Status: DC | PRN

## 2017-08-10 NOTE — Discharge Instructions (Addendum)
Please take short course of steroids as directed, continue using nebulizer or albuterol inhaler treatments as needed at home.  Please continue to treat your cough with Robitussin syrup.  Continue using her home allergy medications.  Follow-up with your primary care doctor on Monday as discussed.  Return to the emergency department for worsening wheezing or shortness of breath, chest pain, fevers or chills or other new or concerning symptoms.

## 2017-08-10 NOTE — ED Provider Notes (Signed)
[facility] EMERGENCY DEPARTMENT Provider Note   CSN: [id number] Arrival date & time: 08/10/17  1338     History   Chief Complaint Chief Complaint  Patient presents with  . Shortness of Breath  . Asthma    HPI Wendy Carr is a 62 y.o. Female with history of asthma, anxiety, hypertension, hypothyroidism, who presents to the ED for evaluation of 1 week of worsening shortness of breath.  Patient reports since the pollen started up she is been having a harder time breathing and thinks her asthma is acting up this is a common trigger for her asthma exacerbations.  Patient reports wheezing, shortness of breath and persistent coughing she reports cough is typically dry occasionally coughs up clear to yellow mucus no hemoptysis.  Patient reports shortness of breath and wheezing has been getting progressively worse over the course of the week, she is been trying to use her inhaler and nebulizer machine at home but she cannot seem to finish a nebulizer treatment due to persistent coughing.  Patient denies fevers reports some occasional chills reports she is been having a lot of nasal congestion and rhinorrhea typical of her allergies.  She reports her chest feels sore she thinks this is mainly from the coughing.  She denies any abdominal pain nausea, had one episode of posttussive emesis yesterday.  She denies lower extremity swelling or pain, no recent long distance travel or surgeries, not on any estrogen therapy, no history of PE or DVT.     Past Medical History:  Diagnosis Date  . ALLERGIC RHINITIS   . Allergy   . ANEMIA-NOS   . ANXIETY   . Arthritis   . ASTHMA   . Blood transfusion without reported diagnosis   . GERD   . HYPERGLYCEMIA, BORDERLINE   . HYPERTENSION   . HYPOTHYROIDISM   . Personal history of colonic polyps 2011   last colonscopy 2011: no polyps.    Patient Active Problem List   Diagnosis Date Noted  . Thyroid dysfunction 06/19/2017  . Routine  general medical examination at a health care facility 07/07/2015  . Sleep disturbance 07/07/2015  . Arthritis 03/30/2014  . Opiate withdrawal ([place]) 12/16/2011  . Cocaine abuse ([place]) 12/16/2011  . Backache 09/03/2008  . HYPERGLYCEMIA, BORDERLINE 09/03/2008  . ANEMIA-NOS 08/20/2008  . Anxiety and depression 08/20/2008  . Essential hypertension 08/20/2008  . ALLERGIC RHINITIS 08/20/2008  . Asthma 08/20/2008  . GERD 08/20/2008    Past Surgical History:  Procedure Laterality Date  . BREAST SURGERY  2004   Lumpectomy, right side-Benign  . COLONOSCOPY    . DILATION AND CURETTAGE OF UTERUS    . FOOT SURGERY  2006   right  . [place] ?[place]  . MYOMECTOMY  1997  . THYROIDECTOMY, PARTIAL  2004   Left, benign  . VEIN SURGERY  1997   left arm     OB History    Gravida  1   Para      Term      Preterm      AB  1   Living        SAB      TAB      Ectopic      Multiple      Live Births               Home Medications    Prior to Admission medications   Medication Sig Start Date End Date  Taking? Authorizing Provider  acetaminophen (TYLENOL) 500 MG tablet Take 2 tablets (1,000 mg total) by mouth every 6 (six) hours as needed. 06/05/16   Charlesetta Shanks, MD  albuterol (PROVENTIL HFA;VENTOLIN HFA) 108 (90 Base) MCG/ACT inhaler Inhale 1-2 puffs into the lungs every 6 (six) hours as needed for wheezing or shortness of breath. 06/19/17   Lance Sell, NP  budesonide-formoterol (SYMBICORT) 80-4.5 MCG/ACT inhaler Inhale 1 puff into the lungs 2 (two) times daily. 06/19/17   Lance Sell, NP  busPIRone (BUSPAR) 10 MG tablet Take 1 tablet (10 mg total) by mouth 2 (two) times daily. 06/19/17   Lance Sell, NP  citalopram (CELEXA) 40 MG tablet Take 1 tablet (40 mg total) by mouth daily. 06/19/17   Lance Sell, NP  ipratropium-albuterol (DUONEB) 0.5-2.5 (3) MG/3ML SOLN Take 3 mLs by nebulization every 6 (six) hours as needed.  05/31/14   Le, Thao P, DO  lisinopril-hydrochlorothiazide (PRINZIDE,ZESTORETIC) 20-25 MG tablet Take 1 tablet by mouth daily. 06/19/17   Lance Sell, NP  loratadine (CLARITIN) 10 MG tablet Take 10 mg by mouth daily as needed for allergies.    [provider]  montelukast (SINGULAIR) 10 MG tablet Take 1 tablet (10 mg total) by mouth at bedtime. 03/13/16   Forrest Moron, MD  Spacer/Aero-Holding Chambers (AEROCHAMBER PLUS WITH MASK) inhaler Use as instructed 06/05/16   Charlesetta Shanks, MD  traZODone (DESYREL) 50 MG tablet Take 1 tablet (50 mg total) by mouth at bedtime. 06/19/17   Lance Sell, NP  vitamin B-12 (CYANOCOBALAMIN) 500 MCG tablet Take 500 mcg by mouth daily.    [provider]    Family History Family History  Problem Relation Age of Onset  . Hypertension Mother   . Diabetes Sister   . Arthritis Other   . Diabetes Other   . Pancreatic cancer Maternal Uncle   . Diabetes Maternal Grandmother   . Ovarian cancer Maternal Grandmother   . Colon cancer Maternal Grandmother   . Esophageal cancer Neg Hx   . Stomach cancer Neg Hx   . Rectal cancer Neg Hx     Social History Social History   Tobacco Use  . Smoking status: Current Every Day Smoker    Packs/day: 0.40    Years: 7.00    Pack years: 2.80    Types: Cigarettes  . Smokeless tobacco: Never Used  . Tobacco comment: Married 2009-lives with spouse, no kids  Substance Use Topics  . Alcohol use: No    Alcohol/week: 0.0 oz  . Drug use: Yes    Types: Cocaine    Comment: Last time usage was December 2016 - heroin and cocaine     Allergies   Naproxen   Review of Systems Review of Systems  Constitutional: Positive for chills. Negative for fever.  HENT: Positive for congestion, ear pain, postnasal drip, rhinorrhea and sore throat.   Eyes: Negative for visual disturbance.  Respiratory: Positive for cough, shortness of breath and wheezing.   Cardiovascular: Positive for chest pain.  Negative for palpitations and leg swelling.  Gastrointestinal: Negative for abdominal pain, diarrhea, nausea and vomiting.  Genitourinary: Negative for dysuria.  Musculoskeletal: Negative for arthralgias and myalgias.  Skin: Negative for color change and rash.  Neurological: Negative for dizziness, weakness, light-headedness and headaches.     Physical Exam Updated Vital Signs BP 137/72 (BP Location: Right Arm)   Pulse 94   Temp 98.2 F (36.8 C)   Resp 20   SpO2 96%  Physical Exam  Constitutional: She appears well-developed and well-nourished. No distress.  HENT:  Head: Normocephalic and atraumatic.  TMs clear with good landmarks, mild nasal mucosa edema with clear rhinorrhea, posterior oropharynx clear and moist, with some erythema, no edema or exudates  Eyes: Right eye exhibits no discharge. Left eye exhibits no discharge.  Neck: Normal range of motion. Neck supple.  No stridor  Cardiovascular: Normal rate, regular rhythm, normal heart sounds and intact distal pulses.  Pulmonary/Chest: Effort normal. No respiratory distress. She has wheezes.  Respirations equal and unlabored, patient able to speak in full sentences, pt does report feeling winded after talking for long periods of time, lungs with end expiratory wheezing throughout bilateral lower lung fields, no rhonchi or rales.  Good air movement.  Abdominal: Soft. Bowel sounds are normal. She exhibits no distension and no mass. There is no tenderness. There is no guarding.  Abdomen soft, nontender to palpation in all quadrants without guarding or rebound tenderness.  Musculoskeletal: She exhibits no edema or deformity.  Bilateral lower extremities without edema, 2+ DP and TP pulses and sensation intact.  Neurological: She is alert. Coordination normal.  Skin: Skin is warm and dry. Capillary refill takes less than 2 seconds. She is not diaphoretic.  Psychiatric: She has a normal mood and affect. Her behavior is normal.    Nursing note and vitals reviewed.    ED Treatments / Results  Labs (all labs ordered are listed, but only abnormal results are displayed) Labs Reviewed  CBC - Abnormal; Notable for the following components:      Result Value   Hemoglobin 11.2 (*)    HCT 35.9 (*)    All other components within normal limits  BASIC METABOLIC PANEL - Abnormal; Notable for the following components:   Glucose, Bld 128 (*)    Calcium 8.8 (*)    All other components within normal limits  I-STAT TROPONIN, ED    EKG EKG Interpretation  Date/Time:  Saturday August 10 2017 16:39:07 EDT Ventricular Rate:  69 PR Interval:    QRS Duration: 83 QT Interval:  447 QTC Calculation: 479 R Axis:   29 Text Interpretation:  Sinus rhythm Probable anteroseptal infarct, old Confirmed by Quintella Reichert [phone]) on 08/10/2017 4:48:47 PM   Radiology Dg Chest 2 View  Result Date: 08/10/2017 CLINICAL DATA:  Pt reports centralized chest pain, mixed cough, SOB, and hoarseness x 1 week; Pt reports hx of HTN and asthma; smoker EXAM: CHEST - 2 VIEW COMPARISON:  06/05/2016. FINDINGS: The heart size and mediastinal contours are within normal limits. Both lungs are clear. No pleural effusion or pneumothorax. The visualized skeletal structures are unremarkable. IMPRESSION: No active cardiopulmonary disease. Electronically Signed   By: Lajean Manes M.D.   On: 08/10/2017 15:18    Procedures Procedures (including critical care time)  Medications Ordered in ED Medications  acetaminophen (TYLENOL) tablet 650 mg (650 mg Oral Refused 08/10/17 1730)  albuterol (PROVENTIL) (2.5 MG/3ML) 0.083% nebulizer solution 5 mg (5 mg Nebulization Given 08/10/17 1353)  ipratropium-albuterol (DUONEB) 0.5-2.5 (3) MG/3ML nebulizer solution 3 mL (3 mLs Nebulization Given 08/10/17 1635)  predniSONE (DELTASONE) tablet 60 mg (60 mg Oral Given 08/10/17 1635)  guaiFENesin (ROBITUSSIN) 100 MG/5ML solution 200 mg (200 mg Oral Given 08/10/17 1654)  albuterol  (PROVENTIL) (2.5 MG/3ML) 0.083% nebulizer solution 5 mg (5 mg Nebulization Given 08/10/17 1846)     Initial Impression / Assessment and Plan / ED Course  I have reviewed the triage vital signs and the nursing  notes.  Pertinent labs & imaging results that were available during my care of the patient were reviewed by me and considered in my medical decision making (see chart for details).  Patient presents to the ED for evaluation of 1 week of progressively worsening shortness of breath, wheezing and cough.  Patient expresses concern for asthma exacerbation due to her allergies.  Mild chest pain, she reports that as soreness from coughing.  No abdominal pain no fevers or chills.  On exam patient with normal vitals and in no acute distress, she does report feeling winded after speaking, normal respiratory effort actively coughing throughout exam.  Lungs with end expiratory wheezes throughout bilateral lower lung fields.  We will get basic labs, troponin and EKG, chest x-ray shows no evidence of active cardiopulmonary disease.  Will give steroids, DuoNeb and cough medication and reevaluate the patient.  Lab evaluation overall very reassuring.  Negative troponin and no ischemic changes on EKG. no leukocytosis, hemoglobin is stable when compared to previous.  No acute electrolyte derangements requiring intervention, normal renal function.  Chest x-ray shows no active cardiopulmonary disease.  On reevaluation after steroids and initial nebulizer treatment patient has improved but is still has some wheezing on exam.  Will give 1 additional albuterol treatment and reevaluate.  After final albuterol nebulizer patient's lungs are completely clear to auscultation, she reports she is feeling much better, work of breathing is relaxed and patient able to speak without feeling exasperated.  She also reports vast improvement in cough with Robitussin.  At this time I feels patient is stable for discharge home, she has  plenty of her albuterol inhaler and nebulizer at home will put her on short course of steroids and prescribe guaifenesin for cough.  Patient has follow-up appointment with her primary care doctor on Monday.  Strict return precautions discussed.  Patient expressed understanding and is in agreement with plan.  Final Clinical Impressions(s) / ED Diagnoses   Final diagnoses:  Exacerbation of asthma, unspecified asthma severity, unspecified whether persistent  Shortness of breath  Cough    ED Discharge Orders        Ordered    predniSONE (DELTASONE) 50 MG tablet  Daily     08/10/17 1927    guaiFENesin (ROBITUSSIN) 100 MG/5ML liquid  Every 4 hours PRN     08/10/17 1927       Jacqlyn Larsen, PA-C 08/10/17 Yolanda Manges, MD 08/10/17 2214

## 2017-08-10 NOTE — ED Notes (Signed)
Pt ambulated to BR with no difficulties.  

## 2017-08-10 NOTE — ED Triage Notes (Signed)
Pt. Stated, Wendy Carr had bad asthma since the pollen started, its making me SOB and my inhaler machine not working.  I also have a callous on the bottom of my foot. It makes it hard to walk. [facility]

## 2017-08-12 ENCOUNTER — Telehealth: Payer: Self-pay | Admitting: Nurse Practitioner

## 2017-08-12 NOTE — Telephone Encounter (Signed)
Patient wanted to set up an appointment for Wed 4/10 @ 8am.

## 2017-08-12 NOTE — Telephone Encounter (Signed)
I would have to see her to sign off on this form; she is overdue to see Hollie Beach on her chronic needs- was due in March.  Is there anyway to schedule something next week with Ashleigh to take care of both issues? I'd be happy to write a note for her to give to her employer in the interim. Or I could see her Wednesday?

## 2017-08-12 NOTE — Telephone Encounter (Signed)
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 I will place the form on your desk for you to review and see if this is something you can do or if she needs to wait for her PCP.

## 2017-08-12 NOTE — Telephone Encounter (Signed)
What are the forms? FMLA? She hasn't seen Ashleigh since February?

## 2017-08-12 NOTE — Telephone Encounter (Signed)
Copied from [facility] [id number]. Topic: General - Other >> Aug 12, 2017  2:28 PM Neva Seat wrote: Pt asking if paperwork dropped off this morning could be "rushed".  Pt's employer isn't letting her return to work until completed.  **I did receive these forms from [facility] this morning. She did inform her that turn around time is 5-7 days, but she was not inform Gayla Medicus is out of office until 4/15. Would you be ok with completing these forms?

## 2017-08-14 ENCOUNTER — Ambulatory Visit (INDEPENDENT_AMBULATORY_CARE_PROVIDER_SITE_OTHER): Payer: 59 | Admitting: Family

## 2017-08-14 ENCOUNTER — Encounter: Payer: Self-pay | Admitting: Family

## 2017-08-14 VITALS — BP 138/88 | HR 96 | Temp 99.2°F | Ht 62.0 in | Wt 203.1 lb

## 2017-08-14 DIAGNOSIS — Z87898 Personal history of other specified conditions: Secondary | ICD-10-CM

## 2017-08-14 DIAGNOSIS — F1911 Other psychoactive substance abuse, in remission: Secondary | ICD-10-CM

## 2017-08-14 DIAGNOSIS — J4540 Moderate persistent asthma, uncomplicated: Secondary | ICD-10-CM | POA: Diagnosis not present

## 2017-08-14 MED ORDER — MONTELUKAST SODIUM 10 MG PO TABS: 10.0000 mg | ORAL_TABLET | Freq: Every day | ORAL | 6 refills | Status: DC

## 2017-08-14 MED ORDER — BUDESONIDE-FORMOTEROL FUMARATE 80-4.5 MCG/ACT IN AERO: 2.0000 | INHALATION_SPRAY | Freq: Two times a day (BID) | RESPIRATORY_TRACT | 3 refills | Status: DC

## 2017-08-14 MED FILL — MONTELUKAST SOD 10 MG TAB: 10 | 30 days supply | Qty: 30 | Fill #0

## 2017-08-14 MED FILL — SYMBICORT 80-4.5 MCG INH: 80-4.5 | 30 days supply | Qty: 10 | Fill #0

## 2017-08-14 MED FILL — traZODone HCL 50 MG TABS: 50 | 30 days supply | Qty: 30 | Fill #1

## 2017-08-14 NOTE — Progress Notes (Signed)
Wendy Carr is a 62 y.o. female with the following history as recorded in EpicCare:  Patient Active Problem List   Diagnosis Date Noted  . Thyroid dysfunction 06/19/2017  . Routine general medical examination at a health care facility 07/07/2015  . Sleep disturbance 07/07/2015  . Arthritis 03/30/2014  . Opiate withdrawal ([place]) 12/16/2011  . Cocaine abuse ([place]) 12/16/2011  . Backache 09/03/2008  . HYPERGLYCEMIA, BORDERLINE 09/03/2008  . ANEMIA-NOS 08/20/2008  . Anxiety and depression 08/20/2008  . Essential hypertension 08/20/2008  . ALLERGIC RHINITIS 08/20/2008  . Asthma 08/20/2008  . GERD 08/20/2008    Current Outpatient Medications  Medication Sig Dispense Refill  . acetaminophen (TYLENOL) 500 MG tablet Take 2 tablets (1,000 mg total) by mouth every 6 (six) hours as needed. (Patient taking differently: Take 1,000 mg by mouth every 6 (six) hours as needed for mild pain, fever or headache. ) 30 tablet 0  . albuterol (PROVENTIL HFA;VENTOLIN HFA) 108 (90 Base) MCG/ACT inhaler Inhale 1-2 puffs into the lungs every 6 (six) hours as needed for wheezing or shortness of breath. 1 Inhaler 2  . budesonide-formoterol (SYMBICORT) 80-4.5 MCG/ACT inhaler Inhale 2 puffs into the lungs 2 (two) times daily. 1 Inhaler 3  . busPIRone (BUSPAR) 10 MG tablet Take 1 tablet (10 mg total) by mouth 2 (two) times daily. 60 tablet 1  . citalopram (CELEXA) 40 MG tablet Take 1 tablet (40 mg total) by mouth daily. 90 tablet 3  . guaiFENesin (ROBITUSSIN) 100 MG/5ML liquid Take 10 mLs (200 mg total) by mouth every 4 (four) hours as needed for cough. 118 mL 0  . ipratropium-albuterol (DUONEB) 0.5-2.5 (3) MG/3ML SOLN Take 3 mLs by nebulization every 6 (six) hours as needed. 360 mL 3  . lisinopril-hydrochlorothiazide (PRINZIDE,ZESTORETIC) 20-25 MG tablet Take 1 tablet by mouth daily. 90 tablet 1  . loratadine (CLARITIN) 10 MG tablet Take 10 mg by mouth daily as needed for allergies.    . montelukast (SINGULAIR) 10  MG tablet Take 1 tablet (10 mg total) by mouth at bedtime. 30 tablet 6  . predniSONE (DELTASONE) 50 MG tablet Take 1 tablet (50 mg total) by mouth daily for 5 days. 5 tablet 0  . Spacer/Aero-Holding Chambers (AEROCHAMBER PLUS WITH MASK) inhaler Use as instructed 1 each 2  . traZODone (DESYREL) 50 MG tablet Take 1 tablet (50 mg total) by mouth at bedtime. 30 tablet 3  . vitamin B-12 (CYANOCOBALAMIN) 500 MCG tablet Take 500 mcg by mouth daily.     No current facility-administered medications for this visit.     Allergies: Naproxen  Past Medical History:  Diagnosis Date  . ALLERGIC RHINITIS   . Allergy   . ANEMIA-NOS   . ANXIETY   . Arthritis   . ASTHMA   . Blood transfusion without reported diagnosis   . GERD   . HYPERGLYCEMIA, BORDERLINE   . HYPERTENSION   . HYPOTHYROIDISM   . Personal history of colonic polyps 2011   last colonscopy 2011: no polyps.    Past Surgical History:  Procedure Laterality Date  . BREAST SURGERY  2004   Lumpectomy, right side-Benign  . COLONOSCOPY    . DILATION AND CURETTAGE OF UTERUS    . FOOT SURGERY  2006   right  . [place] ?[place]  . MYOMECTOMY  1997  . THYROIDECTOMY, PARTIAL  2004   Left, benign  . VEIN SURGERY  1997   left arm    Family History  Problem Relation  Age of Onset  . Hypertension Mother   . Diabetes Sister   . Arthritis Other   . Diabetes Other   . Pancreatic cancer Maternal Uncle   . Diabetes Maternal Grandmother   . Ovarian cancer Maternal Grandmother   . Colon cancer Maternal Grandmother   . Esophageal cancer Neg Hx   . Stomach cancer Neg Hx   . Rectal cancer Neg Hx     Social History   Tobacco Use  . Smoking status: Current Every Day Smoker    Packs/day: 0.40    Years: 7.00    Pack years: 2.80    Types: Cigarettes  . Smokeless tobacco: Never Used  . Tobacco comment: Married 2009-lives with spouse, no kids  Substance Use Topics  . 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  Head: Normocephalic and atraumatic  Eyes: Sclera and conjunctiva clear; pupils round and reactive to light; extraocular movements intact  Ears: External normal; canals clear; tympanic membranes normal  Oropharynx: Pink, supple. No suspicious lesions  Neck: Supple without thyromegaly, adenopathy  Lungs: Respirations unlabored; clear to auscultation bilaterally without wheeze, rales, rhonchi  CVS exam: normal rate and regular rhythm.  Abdomen: Soft; nontender; nondistended; normoactive bowel sounds; no masses or hepatosplenomegaly  Musculoskeletal: No deformities; no active joint inflammation  Extremities: No edema, cyanosis, clubbing  Vessels: Symmetric bilaterally  Neurologic: Alert and oriented; speech intact; face symmetrical; moves all extremities well; CNII-XII intact without focal deficit  Assessment:  1. Moderate persistent asthma without complication   2. History of drug abuse in remission     Plan:  1. Increase Symbicort to 2  puffs bid; may need to consider starting patient on the 160 mg dosage as opposed to 80 mg dosage; re-start Singulair 10 mg daily; follow-up in 1 month; work note given as requested; follow-up sooner if asthma re-flares; 2. Check urine drug screen today; paperwork for her job is completed contingent on normal drug screen- she understands;   Return in about 1 month (around 09/11/2017), or with [facility] for asthma re-check.  Orders Placed This Encounter  Procedures  . Pain Mgmt, Profile 8 w/Conf, U    Standing Status:   Future    Standing Expiration Date:   08/15/2018    Requested Prescriptions   Signed Prescriptions Disp Refills  . budesonide-formoterol (SYMBICORT) 80-4.5 MCG/ACT inhaler 1 Inhaler 3    Sig: Inhale 2 puffs into the lungs 2 (  two) times daily.  . montelukast (SINGULAIR) 10 MG tablet 30 tablet 6    Sig: Take 1 tablet (10 mg total) by mouth at bedtime.

## 2017-08-14 NOTE — Telephone Encounter (Signed)
Forms was completed during office visit.

## 2017-09-17 ENCOUNTER — Ambulatory Visit: Payer: Self-pay | Admitting: Nurse Practitioner

## 2017-09-19 MED FILL — traZODone HCL 50 MG TABS: 50 | 30 days supply | Qty: 30 | Fill #2

## 2017-09-19 MED FILL — MONTELUKAST SOD 10 MG TAB: 10 | 30 days supply | Qty: 30 | Fill #1

## 2017-09-19 MED FILL — LISINOPRIL-HCTZ 20-25 MG TA: 20-25 | 90 days supply | Qty: 90 | Fill #1

## 2017-10-06 DIAGNOSIS — R42 Dizziness and giddiness: Secondary | ICD-10-CM | POA: Insufficient documentation

## 2017-10-06 DIAGNOSIS — K0889 Other specified disorders of teeth and supporting structures: Secondary | ICD-10-CM | POA: Diagnosis not present

## 2017-11-08 MED FILL — CITALOPRAM HBR 40 MG TABLET: 40 | 90 days supply | Qty: 90 | Fill #1

## 2017-11-08 MED FILL — busPIRone HCL 10 MG TABS: 10 | 30 days supply | Qty: 60 | Fill #1

## 2017-11-08 MED FILL — traZODone HCL 50 MG TABS: 50 | 30 days supply | Qty: 30 | Fill #3

## 2017-11-08 MED FILL — MONTELUKAST SOD 10 MG TAB: 10 | 30 days supply | Qty: 30 | Fill #2

## 2017-12-19 ENCOUNTER — Telehealth: Payer: Self-pay | Admitting: Nurse Practitioner

## 2017-12-19 DIAGNOSIS — F419 Anxiety disorder, unspecified: Secondary | ICD-10-CM

## 2017-12-19 DIAGNOSIS — F329 Major depressive disorder, single episode, unspecified: Secondary | ICD-10-CM

## 2017-12-19 DIAGNOSIS — I10 Essential (primary) hypertension: Secondary | ICD-10-CM

## 2017-12-19 DIAGNOSIS — G479 Sleep disorder, unspecified: Secondary | ICD-10-CM

## 2017-12-19 MED ORDER — TRAZODONE HCL 50 MG PO TABS: 50.0000 mg | ORAL_TABLET | Freq: Every day | ORAL | 0 refills | Status: DC

## 2017-12-19 MED ORDER — LISINOPRIL-HYDROCHLOROTHIAZIDE 20-25 MG PO TABS: 1.0000 | ORAL_TABLET | Freq: Every day | ORAL | 0 refills | Status: DC

## 2017-12-19 MED FILL — LISINOPRIL-HCTZ 20-25 MG TA: 20-25 | 30 days supply | Qty: 30 | Fill #0

## 2017-12-19 MED FILL — traZODone HCL 50 MG TABS: 50 | 30 days supply | Qty: 30 | Fill #0

## 2017-12-19 NOTE — Telephone Encounter (Signed)
Per chart pt has appt schedule for 12/23/17. Per office policy sent 30 day to local pharmacy until appt...Wendy Carr

## 2017-12-19 NOTE — Telephone Encounter (Signed)
Copied from [place] [id number]. Topic: General - Other >> Dec 19, 2017  1:42 PM [place], Bary Castilla R wrote: Pt called in and wanted to know if she could get a refill until she can be seen. Advised pt that she needed a follow up appt and it was scheduled but she states she is completely out of meds and needs something to cover until she can be seen.  lisinopril-hydrochlorothiazide (PRINZIDE,ZESTORETIC) 20-25 MG tablet  traZODone (DESYREL) 50 MG tablet

## 2017-12-23 ENCOUNTER — Ambulatory Visit: Payer: Self-pay | Admitting: Nurse Practitioner

## 2017-12-23 DIAGNOSIS — Z0289 Encounter for other administrative examinations: Secondary | ICD-10-CM

## 2018-01-20 ENCOUNTER — Ambulatory Visit (INDEPENDENT_AMBULATORY_CARE_PROVIDER_SITE_OTHER): Payer: 59

## 2018-01-20 ENCOUNTER — Ambulatory Visit (INDEPENDENT_AMBULATORY_CARE_PROVIDER_SITE_OTHER): Payer: 59 | Admitting: Family Medicine

## 2018-01-20 ENCOUNTER — Encounter (INDEPENDENT_AMBULATORY_CARE_PROVIDER_SITE_OTHER): Payer: Self-pay | Admitting: Family Medicine

## 2018-01-20 DIAGNOSIS — M79672 Pain in left foot: Secondary | ICD-10-CM | POA: Diagnosis not present

## 2018-01-20 DIAGNOSIS — M25562 Pain in left knee: Secondary | ICD-10-CM | POA: Diagnosis not present

## 2018-01-20 MED ORDER — DICLOFENAC SODIUM 1 % TD GEL: 4.0000 g | Freq: Four times a day (QID) | TRANSDERMAL | 6 refills | Status: DC | PRN

## 2018-01-20 MED ORDER — HYDROCODONE-ACETAMINOPHEN 5-325 MG PO TABS: 1.0000 | ORAL_TABLET | Freq: Four times a day (QID) | ORAL | 0 refills | Status: DC | PRN

## 2018-01-20 MED FILL — HYDROCODON-APAP 5-325: 5-325 | 2 days supply | Qty: 12 | Fill #0

## 2018-01-20 MED FILL — DICLOFENAC SODIUM 1% GEL: 1 | 31 days supply | Qty: 500 | Fill #0

## 2018-01-20 NOTE — Progress Notes (Signed)
Office Visit Note   Patient: Wendy Carr           Date of Birth: 02-May-1956           MRN: [id number] Visit Date: 01/20/2018 Requested by: Lance Sell, NP [place], [place] [address] PCP: Lance Sell, NP  Subjective: Chief Complaint  Patient presents with  . Left Knee - Pain    Fell on knee 2 weeks ago.  . Left Foot - Pain, Callouses    HPI: She is a 62 year old with left knee and foot pain.  Her foot started bothering her about 5 or 6 months ago, she developed a hard callus on the plantar aspect making her walk with a limp.  She tries to file it down, but it does not seem to give any relief.  She also notes that the skin of her foot seems to be getting much thicker than usual in other areas, around the nails and even on the side and heel.  She is not sure why things have changed.  A couple weeks ago she fell and landed directly on the front of her left knee.  It did not hurt at first, but then it started to swell and now it aches and throbs, making it hard to sleep at night.  No previous problems with her knee.  Denies any history of gout.  Denies any locking or catching symptoms.               ROS: Otherwise noncontributory  Objective: Vital Signs: There were no vitals taken for this visit.  Physical Exam:  Left knee: 1-2+ effusion with no warmth or erythema.  Resolving bruise near the patella tendon.  Ligaments feel stable, extensor mechanism is intact.  Slightly tender on the medial and lateral joint lines, no palpable click with McMurray's. Left foot: Dry thick skin on her heel.  There is a thick callus underneath the second MTP joint with a possible corn.  She has another thick callus on the lateral aspect of her fifth MTP joint.  Pes cavus with relatively tight heel cords.  Imaging: Three-view x-rays left knee: Mild tricompartmental arthritis.  No loose body, no sign of fracture.  2 view x-rays left foot: Traction spur at the calcaneus,  mild to moderate first MTP DJD, no abnormal findings of the second MTP.  Assessment & Plan: 1.  Left foot pain due to second MTP corn -This was pared successfully today.  She felt much better afterward.  Follow-up PRN.  2.  Left knee pain with effusion, probably due to DJD.  Cannot rule out degenerative meniscus tear. -Voltaren gel, pain medicine as needed.  Could contemplate injection if symptoms persist.   Follow-Up Instructions: No follow-ups on file.       Procedures: Left foot second MTP corn was pared successfully without complication.   PMFS History: Patient Active Problem List   Diagnosis Date Noted  . Thyroid dysfunction 06/19/2017  . Routine general medical examination at a health care facility 07/07/2015  . Sleep disturbance 07/07/2015  . Arthritis 03/30/2014  . Opiate withdrawal ([place]) 12/16/2011  . Cocaine abuse ([place]) 12/16/2011  . Backache 09/03/2008  . HYPERGLYCEMIA, BORDERLINE 09/03/2008  . ANEMIA-NOS 08/20/2008  . Anxiety and depression 08/20/2008  . Essential hypertension 08/20/2008  . ALLERGIC RHINITIS 08/20/2008  . Asthma 08/20/2008  . GERD 08/20/2008   Past Medical History:  Diagnosis Date  . ALLERGIC RHINITIS   . Allergy   . ANEMIA-NOS   .  ANXIETY   . Arthritis   . ASTHMA   . Blood transfusion without reported diagnosis   . GERD   . HYPERGLYCEMIA, BORDERLINE   . HYPERTENSION   . HYPOTHYROIDISM   . Personal history of colonic polyps 2011   last colonscopy 2011: no polyps.    Family History  Problem Relation Age of Onset  . Hypertension Mother   . Diabetes Sister   . Arthritis Other   . Diabetes Other   . Pancreatic cancer Maternal Uncle   . Diabetes Maternal Grandmother   . Ovarian cancer Maternal Grandmother   . Colon cancer Maternal Grandmother   . Esophageal cancer Neg Hx   . Stomach cancer Neg Hx   . Rectal cancer Neg Hx     Past Surgical History:  Procedure Laterality Date  . BREAST SURGERY  2004   Lumpectomy, right  side-Benign  . COLONOSCOPY    . DILATION AND CURETTAGE OF UTERUS    . FOOT SURGERY  2006   right  . [place] ?[place]  . MYOMECTOMY  1997  . THYROIDECTOMY, PARTIAL  2004   Left, benign  . VEIN SURGERY  1997   left arm   Social History   Occupational History  . Not on file  Tobacco Use  . Smoking status: Current Every Day Smoker    Packs/day: 0.40    Years: 7.00    Pack years: 2.80    Types: Cigarettes  . Smokeless tobacco: Never Used  . Tobacco comment: Married 2009-lives with spouse, no kids  Substance and Sexual Activity  . Alcohol use: No    Alcohol/week: 0.0 standard drinks  . Drug use: Yes    Types: Cocaine    Comment: Last time usage was December 2016 - heroin and cocaine  . Sexual activity: Yes    Partners: Male    Birth control/protection: Abstinence

## 2018-02-08 DIAGNOSIS — M13862 Other specified arthritis, left knee: Secondary | ICD-10-CM | POA: Diagnosis not present

## 2018-04-01 ENCOUNTER — Telehealth (INDEPENDENT_AMBULATORY_CARE_PROVIDER_SITE_OTHER): Payer: Self-pay | Admitting: Family Medicine

## 2018-04-01 ENCOUNTER — Encounter (INDEPENDENT_AMBULATORY_CARE_PROVIDER_SITE_OTHER): Payer: Self-pay

## 2018-04-01 NOTE — Telephone Encounter (Signed)
Patient called stating that her husband has accidental insurance and needs a statement or letter stating what he treated her for.  YB#[phone]

## 2018-04-01 NOTE — Telephone Encounter (Signed)
Spoke with patient: office visit note from 01/20/18 and a note stating she was out of work 01/20/18 - 01/21/18 provided for patient to pick up at the front desk.

## 2018-10-02 ENCOUNTER — Ambulatory Visit: Payer: 59

## 2018-10-02 ENCOUNTER — Ambulatory Visit (INDEPENDENT_AMBULATORY_CARE_PROVIDER_SITE_OTHER): Payer: 59 | Admitting: Internal Medicine

## 2018-10-02 ENCOUNTER — Other Ambulatory Visit: Payer: Self-pay

## 2018-10-02 ENCOUNTER — Encounter: Payer: Self-pay | Admitting: Internal Medicine

## 2018-10-02 VITALS — BP 160/100 | HR 70 | Temp 98.1°F | Ht 62.0 in | Wt 208.0 lb

## 2018-10-02 DIAGNOSIS — G479 Sleep disorder, unspecified: Secondary | ICD-10-CM

## 2018-10-02 DIAGNOSIS — M1990 Unspecified osteoarthritis, unspecified site: Secondary | ICD-10-CM

## 2018-10-02 DIAGNOSIS — F419 Anxiety disorder, unspecified: Secondary | ICD-10-CM

## 2018-10-02 DIAGNOSIS — F329 Major depressive disorder, single episode, unspecified: Secondary | ICD-10-CM | POA: Diagnosis not present

## 2018-10-02 DIAGNOSIS — I10 Essential (primary) hypertension: Secondary | ICD-10-CM | POA: Diagnosis not present

## 2018-10-02 MED ORDER — BUSPIRONE HCL 10 MG PO TABS: 10.0000 mg | ORAL_TABLET | Freq: Two times a day (BID) | ORAL | 0 refills | Status: DC

## 2018-10-02 MED ORDER — TRAZODONE HCL 50 MG PO TABS: 50.0000 mg | ORAL_TABLET | Freq: Every day | ORAL | 0 refills | Status: DC

## 2018-10-02 MED ORDER — CITALOPRAM HYDROBROMIDE 20 MG PO TABS: 20.0000 mg | ORAL_TABLET | Freq: Every day | ORAL | 0 refills | Status: DC

## 2018-10-02 MED ORDER — LOSARTAN POTASSIUM-HCTZ 100-25 MG PO TABS: 1.0000 | ORAL_TABLET | Freq: Every day | ORAL | 0 refills | Status: DC

## 2018-10-02 MED FILL — LOSARTAN-HCTZ 100-25 MG TAB: 100-25 | 30 days supply | Qty: 30 | Fill #0

## 2018-10-02 MED FILL — CITALOPRAM HBR 20 MG TABLET: 20 | 30 days supply | Qty: 30 | Fill #0

## 2018-10-02 MED FILL — busPIRone HCL 10 MG TABS: 10 | 30 days supply | Qty: 60 | Fill #0

## 2018-10-02 MED FILL — traZODone HCL 50 MG TABS: 50 | 30 days supply | Qty: 30 | Fill #0

## 2018-10-02 NOTE — Assessment & Plan Note (Signed)
Not controlled, related to noncompliance with f/u and medication Has taken her husbands losartan last night and tonight Start losartan- hctz 100 -25 mg daily Monitor BP at home Stressed compliance with medication  has f/u next week with new pcp at [facility]

## 2018-10-02 NOTE — Assessment & Plan Note (Signed)
Anxiety not controlled - has not been on medication Restart celexa at 20 mg daily Restart buspar  has f/u next week with new pcp at [facility]

## 2018-10-02 NOTE — Patient Instructions (Addendum)
  Medications reviewed and updated.  Changes include :  Start Losartan-hctz 100 -25 mg daily.  Start trazodone, celexa and buspar.   Your prescription(s) have been submitted to your pharmacy. Please take as directed and contact our office if you believe you are having problem(s) with the medication(s).

## 2018-10-02 NOTE — Assessment & Plan Note (Signed)
Not sleeping - was taking trazodone and has been out of it Will restart  has f/u next week with new pcp at [facility]

## 2018-10-02 NOTE — Assessment & Plan Note (Signed)
Has chronic arthritis  Will not prescribe any medication today for this  has f/u next week with new pcp at [facility]

## 2018-10-02 NOTE — Progress Notes (Signed)
Subjective:    Patient ID: Wendy Carr, female    DOB: 1956-02-06, 63 y.o.   MRN: [id number]  HPI The patient is here for an acute visit.  She was Ashleigh's patient and will be establishing with [facility] next week.  She has been out of her medications for a while because she has not followed up.   She has been out of medication for 6 months. She has not felt well for a while but really felt it starting last week.  She states blurry vision, dizziness and headaches.  She check her BP last night and it was 198/94.  She took her husbands losartan 100 mg and took another one this morning.  She was on lisinopril-hctz and would prefer to be on something different than lisinopril.   OA is bothering her.  Fell on her knee recently and it is a little swollen.  She has a knot on left wrist, back pain.  She wondered if she could get some tramadol.   She has anxiety and has been out of her medications.  She also was taking trazodone for sleep and has not been sleeping.  She wondered about getting a refill for that.      Medications and allergies reviewed with patient and updated if appropriate.  Patient Active Problem List   Diagnosis Date Noted  . Thyroid dysfunction 06/19/2017  . Routine general medical examination at a health care facility 07/07/2015  . Sleep disturbance 07/07/2015  . Arthritis 03/30/2014  . Opiate withdrawal ([place]) 12/16/2011  . Cocaine abuse ([place]) 12/16/2011  . Backache 09/03/2008  . HYPERGLYCEMIA, BORDERLINE 09/03/2008  . ANEMIA-NOS 08/20/2008  . Anxiety and depression 08/20/2008  . Essential hypertension 08/20/2008  . ALLERGIC RHINITIS 08/20/2008  . Asthma 08/20/2008  . GERD 08/20/2008    Current Outpatient Medications on File Prior to Visit  Medication Sig Dispense Refill  . acetaminophen (TYLENOL) 500 MG tablet Take 2 tablets (1,000 mg total) by mouth every 6 (six) hours as needed. (Patient taking differently: Take 1,000 mg by mouth every 6 (six) hours as needed  for mild pain, fever or headache. ) 30 tablet 0  . albuterol (PROVENTIL HFA;VENTOLIN HFA) 108 (90 Base) MCG/ACT inhaler Inhale 1-2 puffs into the lungs every 6 (six) hours as needed for wheezing or shortness of breath. 1 Inhaler 2  . budesonide-formoterol (SYMBICORT) 80-4.5 MCG/ACT inhaler Inhale 2 puffs into the lungs 2 (two) times daily. 1 Inhaler 3  . ipratropium-albuterol (DUONEB) 0.5-2.5 (3) MG/3ML SOLN Take 3 mLs by nebulization every 6 (six) hours as needed. 360 mL 3  . loratadine (CLARITIN) 10 MG tablet Take 10 mg by mouth daily as needed for allergies.    . montelukast (SINGULAIR) 10 MG tablet Take 1 tablet (10 mg total) by mouth at bedtime. 30 tablet 6  . Spacer/Aero-Holding Chambers (AEROCHAMBER PLUS WITH MASK) inhaler Use as instructed 1 each 2  . vitamin B-12 (CYANOCOBALAMIN) 500 MCG tablet Take 500 mcg by mouth daily.    . busPIRone (BUSPAR) 10 MG tablet Take 1 tablet (10 mg total) by mouth 2 (two) times daily. (Patient not taking: Reported on 10/02/2018) 60 tablet 1  . citalopram (CELEXA) 40 MG tablet Take 1 tablet (40 mg total) by mouth daily. (Patient not taking: Reported on 10/02/2018) 90 tablet 3  . diclofenac sodium (VOLTAREN) 1 % GEL Apply 4 g topically 4 (four) times daily as needed. (Patient not taking: Reported on 10/02/2018) 500 g 6  . guaiFENesin (ROBITUSSIN) 100 MG/5ML liquid  Take 10 mLs (200 mg total) by mouth every 4 (four) hours as needed for cough. (Patient not taking: Reported on 10/02/2018) 118 mL 0  . HYDROcodone-acetaminophen (NORCO/VICODIN) 5-325 MG tablet Take 1-2 tablets by mouth every 6 (six) hours as needed for moderate pain. (Patient not taking: Reported on 10/02/2018) 12 tablet 0  . lisinopril-hydrochlorothiazide (PRINZIDE,ZESTORETIC) 20-25 MG tablet Take 1 tablet by mouth daily. Must keep scheduled appt for future refills (Patient not taking: Reported on 10/02/2018) 30 tablet 0  . traZODone (DESYREL) 50 MG tablet Take 1 tablet (50 mg total) by mouth at bedtime. Must  keep scheduled appt for future refills (Patient not taking: Reported on 10/02/2018) 30 tablet 0   No current facility-administered medications on file prior to visit.     Past Medical History:  Diagnosis Date  . ALLERGIC RHINITIS   . Allergy   . ANEMIA-NOS   . ANXIETY   . Arthritis   . ASTHMA   . Blood transfusion without reported diagnosis   . GERD   . HYPERGLYCEMIA, BORDERLINE   . HYPERTENSION   . HYPOTHYROIDISM   . Personal history of colonic polyps 2011   last colonscopy 2011: no polyps.    Past Surgical History:  Procedure Laterality Date  . BREAST SURGERY  2004   Lumpectomy, right side-Benign  . COLONOSCOPY    . DILATION AND CURETTAGE OF UTERUS    . FOOT SURGERY  2006   right  . [place] ?[place]  . MYOMECTOMY  1997  . THYROIDECTOMY, PARTIAL  2004   Left, benign  . VEIN SURGERY  1997   left arm    Social History   Socioeconomic History  . Marital status: Married    Spouse name: Not on file  . Number of children: 0  . Years of education: 67  . Highest education level: Not on file  Occupational History  . Not on file  Social Needs  . Financial resource strain: Not on file  . Food insecurity:    Worry: Not on file    Inability: Not on file  . Transportation needs:    Medical: Not on file    Non-medical: Not on file  Tobacco Use  . Smoking status: Current Every Day Smoker    Packs/day: 0.40    Years: 7.00    Pack years: 2.80    Types: Cigarettes  . Smokeless tobacco: Never Used  . Tobacco comment: Married 2009-lives with spouse, no kids  Substance and Sexual Activity  . Alcohol use: No    Alcohol/week: 0.0 standard drinks  . Drug use: Yes    Types: Cocaine    Comment: Last time usage was December 2016 - heroin and cocaine  . Sexual activity: Yes    Partners: Male    Birth control/protection: Abstinence  Lifestyle  . Physical activity:    Days per week: Not on file    Minutes per session: Not on file  . Stress: Not on  file  Relationships  . Social connections:    Talks on phone: Not on file    Gets together: Not on file    Attends religious service: Not on file    Active member of club or organization: Not on file    Attends meetings of clubs or organizations: Not on file    Relationship status: Not on file  Other Topics Concern  . Not on file  Social History Narrative   Denies abuse and feels safe  at home.     Family History  Problem Relation Age of Onset  . Hypertension Mother   . Diabetes Sister   . Arthritis Other   . Diabetes Other   . Pancreatic cancer Maternal Uncle   . Diabetes Maternal Grandmother   . Ovarian cancer Maternal Grandmother   . Colon cancer Maternal Grandmother   . Esophageal cancer Neg Hx   . Stomach cancer Neg Hx   . Rectal cancer Neg Hx     Review of Systems  Constitutional: Negative for chills and fever.  Eyes: Positive for visual disturbance (blurry vision).  Respiratory: Positive for shortness of breath (little ) and wheezing (doing breathing treatments - helps). Negative for cough.   Cardiovascular: Positive for palpitations (with asthma). Negative for chest pain and leg swelling.  Musculoskeletal: Positive for arthralgias and back pain.  Neurological: Positive for dizziness and headaches.       Objective:   Vitals:   10/02/18 1037  BP: (!) 160/100  Pulse: 70  Temp: 98.1 F (36.7 C)  SpO2: 99%   BP Readings from Last 3 Encounters:  10/02/18 (!) 160/100  08/14/17 138/88  08/10/17 (!) 154/87   Wt Readings from Last 3 Encounters:  10/02/18 208 lb (94.3 kg)  08/14/17 203 lb 1.9 oz (92.1 kg)  06/19/17 205 lb 12.8 oz (93.4 kg)   Body mass index is 38.04 kg/m.   Physical Exam    Constitutional: Appears well-developed and well-nourished. No distress.  HENT:  Head: Normocephalic and atraumatic.  Neck: Neck supple. No tracheal deviation present. No thyromegaly present.  No cervical lymphadenopathy Cardiovascular: Normal rate, regular rhythm  and normal heart sounds.   No murmur heard. No carotid bruit .  No edema Pulmonary/Chest: Effort normal and breath sounds normal. No respiratory distress. No has no wheezes. No rales.  Skin: Skin is warm and dry. Not diaphoretic.  Psychiatric: anxious mood and affect. Behavior is normal.       Assessment & Plan:    See Problem List for Assessment and Plan of chronic medical problems.

## 2018-10-03 ENCOUNTER — Other Ambulatory Visit: Payer: Self-pay

## 2018-10-03 ENCOUNTER — Encounter (HOSPITAL_COMMUNITY): Payer: Self-pay | Admitting: Emergency Medicine

## 2018-10-03 ENCOUNTER — Emergency Department (HOSPITAL_COMMUNITY): Payer: 59

## 2018-10-03 ENCOUNTER — Emergency Department (HOSPITAL_COMMUNITY)
Admission: EM | Admit: 2018-10-03 | Discharge: 2018-10-03 | Disposition: A | Discharge status: Home / Self Care | Payer: 59 | Attending: Emergency Medicine | Admitting: Emergency Medicine

## 2018-10-03 DIAGNOSIS — I10 Essential (primary) hypertension: Secondary | ICD-10-CM | POA: Diagnosis present

## 2018-10-03 DIAGNOSIS — I161 Hypertensive emergency: Secondary | ICD-10-CM | POA: Diagnosis not present

## 2018-10-03 DIAGNOSIS — R519 Headache, unspecified: Secondary | ICD-10-CM

## 2018-10-03 DIAGNOSIS — F17210 Nicotine dependence, cigarettes, uncomplicated: Secondary | ICD-10-CM | POA: Insufficient documentation

## 2018-10-03 DIAGNOSIS — Z79899 Other long term (current) drug therapy: Secondary | ICD-10-CM | POA: Insufficient documentation

## 2018-10-03 DIAGNOSIS — Z8709 Personal history of other diseases of the respiratory system: Secondary | ICD-10-CM | POA: Insufficient documentation

## 2018-10-03 DIAGNOSIS — R51 Headache: Secondary | ICD-10-CM | POA: Insufficient documentation

## 2018-10-03 DIAGNOSIS — E039 Hypothyroidism, unspecified: Secondary | ICD-10-CM | POA: Diagnosis not present

## 2018-10-03 DIAGNOSIS — R001 Bradycardia, unspecified: Secondary | ICD-10-CM | POA: Diagnosis not present

## 2018-10-03 DIAGNOSIS — K0889 Other specified disorders of teeth and supporting structures: Secondary | ICD-10-CM | POA: Insufficient documentation

## 2018-10-03 DIAGNOSIS — I160 Hypertensive urgency: Secondary | ICD-10-CM | POA: Diagnosis not present

## 2018-10-03 DIAGNOSIS — K029 Dental caries, unspecified: Secondary | ICD-10-CM | POA: Insufficient documentation

## 2018-10-03 LAB — CBG MONITORING, ED
Glucose-Capillary: 68 mg/dL — ABNORMAL LOW (ref 70–99)
Glucose-Capillary: 65 mg/dL — ABNORMAL LOW (ref 70–99)

## 2018-10-03 LAB — CBC
HCT: 42.1 % (ref 36.0–46.0)
Hemoglobin: 12.9 g/dL (ref 12.0–15.0)
MCH: 26.7 pg (ref 26.0–34.0)
MCHC: 30.6 g/dL (ref 30.0–36.0)
MCV: 87 fL (ref 80.0–100.0)
Platelets: 159 10*3/uL (ref 150–400)
RBC: 4.84 MIL/uL (ref 3.87–5.11)
RDW: 16.1 % — ABNORMAL HIGH (ref 11.5–15.5)
WBC: 5.8 10*3/uL (ref 4.0–10.5)
nRBC: 0 % (ref 0.0–0.2)

## 2018-10-03 LAB — BASIC METABOLIC PANEL
Anion gap: 7 (ref 5–15)
BUN: 9 mg/dL (ref 8–23)
CO2: 27 mmol/L (ref 22–32)
Calcium: 9.7 mg/dL (ref 8.9–10.3)
Chloride: 106 mmol/L (ref 98–111)
Creatinine, Ser: 0.86 mg/dL (ref 0.44–1.00)
GFR calc Af Amer: >60 mL/min (ref >60)
GFR calc non Af Amer: >60 mL/min (ref >60)
Glucose, Bld: 87 mg/dL (ref 70–99)
Potassium: 4 mmol/L (ref 3.5–5.1)
Sodium: 140 mmol/L (ref 135–145)

## 2018-10-03 LAB — URINALYSIS, ROUTINE W REFLEX MICROSCOPIC
Bilirubin Urine: NEGATIVE
Glucose, UA: NEGATIVE mg/dL
Hgb urine dipstick: NEGATIVE
Ketones, ur: NEGATIVE mg/dL
Leukocytes,Ua: NEGATIVE
Nitrite: NEGATIVE
Protein, ur: NEGATIVE mg/dL
Specific Gravity, Urine: 1.006 (ref 1.005–1.030)
pH: 6 (ref 5.0–8.0)

## 2018-10-03 MED ORDER — HYDRALAZINE HCL 20 MG/ML IJ SOLN
10.0000 mg | INTRAMUSCULAR | Status: AC
  Administered 2018-10-03: 10 mg via INTRAVENOUS
  Filled 2018-10-03: qty 1

## 2018-10-03 MED ORDER — SODIUM CHLORIDE 0.9 % IV BOLUS
1000.0000 mL | Freq: Once | INTRAVENOUS | Status: AC
  Administered 2018-10-03: 1000 mL via INTRAVENOUS

## 2018-10-03 MED ORDER — TRAMADOL HCL 50 MG PO TABS: 50.0000 mg | ORAL_TABLET | Freq: Four times a day (QID) | ORAL | 0 refills | Status: DC | PRN

## 2018-10-03 MED ORDER — AMOXICILLIN 500 MG PO CAPS: 500.0000 mg | ORAL_CAPSULE | Freq: Three times a day (TID) | ORAL | 0 refills | Status: DC

## 2018-10-03 MED ORDER — KETOROLAC TROMETHAMINE 15 MG/ML IJ SOLN
15.0000 mg | Freq: Once | INTRAMUSCULAR | Status: AC
  Administered 2018-10-03: 21:00:00 15 mg via INTRAVENOUS
  Filled 2018-10-03: qty 1

## 2018-10-03 MED ORDER — HYDRALAZINE HCL 20 MG/ML IJ SOLN
20.0000 mg | Freq: Once | INTRAMUSCULAR | Status: AC
  Administered 2018-10-03: 20 mg via INTRAVENOUS
  Filled 2018-10-03: qty 1

## 2018-10-03 MED ORDER — SODIUM CHLORIDE 0.9% FLUSH
3.0000 mL | Freq: Once | INTRAVENOUS | Status: AC
  Administered 2018-10-03: 3 mL via INTRAVENOUS

## 2018-10-03 MED ORDER — PROCHLORPERAZINE EDISYLATE 10 MG/2ML IJ SOLN
10.0000 mg | Freq: Once | INTRAMUSCULAR | Status: AC
  Administered 2018-10-03: 10 mg via INTRAVENOUS
  Filled 2018-10-03: qty 2

## 2018-10-03 MED ORDER — DIPHENHYDRAMINE HCL 50 MG/ML IJ SOLN
25.0000 mg | Freq: Once | INTRAMUSCULAR | Status: AC
  Administered 2018-10-03: 25 mg via INTRAVENOUS
  Filled 2018-10-03: qty 1

## 2018-10-03 NOTE — ED Notes (Signed)
Jori Moll - husband- [phone]

## 2018-10-03 NOTE — ED Notes (Signed)
Drink and crackers given to pt.  

## 2018-10-03 NOTE — ED Notes (Signed)
Discharge instructions and HTN management discussed with pt. Pt verbalized understanding. Pt to go home with family member.

## 2018-10-03 NOTE — ED Notes (Signed)
PA informed of elevated BP. Per PA to DC pt.

## 2018-10-03 NOTE — ED Triage Notes (Signed)
Pt. Stated, Ive had high BP for the last week, Ive not taken my BP medicine in 6 months.For a lot of reasons. Ive felt dizziness with a headache and when I bend down I feel like Im going to pass out.

## 2018-10-03 NOTE — Discharge Instructions (Signed)
°  Contact a health care provider if: You think you are having a reaction to a medicine you are taking. You have headaches that keep coming back (recurring). You feel dizzy. You have swelling in your ankles. You have trouble with your vision. Get help right away if: You develop a severe headache or confusion. You have unusual weakness or numbness. You feel faint. You have severe pain in your chest or abdomen. You vomit repeatedly. You have trouble breathing.

## 2018-10-03 NOTE — ED Provider Notes (Signed)
[facility] EMERGENCY DEPARTMENT Provider Note   CSN: [id number] Arrival date & time: 10/03/18  1845    History   Chief Complaint Chief Complaint  Patient presents with  . Hypertension  . Dizziness    HPI Wendy Carr is a 63 y.o. female resents the emergency department with chief complaint of hypertension and headache.  Patient states that she has been out of her hypertensive medications for the past 3 months.  She followed up yesterday and was started on losartan/hydrochlorthiazide 1 daily and take it for the first time this morning.  She has had a pain in her left front lateral incisor for the past several days.  She describes the pain as throbbing.  She has associated global throbbing headache.  She has some blurred vision is feeling a little bit dizzy.  She attributes this to her headache and hypertension.  She denies other neurologic symptoms such as ataxia, unilateral weakness, difficulty with speech or swallowing, or paresthesias.  The patient has been taking Motrin without relief of her headache.    HPI  Past Medical History:  Diagnosis Date  . ALLERGIC RHINITIS   . Allergy   . ANEMIA-NOS   . ANXIETY   . Arthritis   . ASTHMA   . Blood transfusion without reported diagnosis   . GERD   . HYPERGLYCEMIA, BORDERLINE   . HYPERTENSION   . HYPOTHYROIDISM   . Personal history of colonic polyps 2011   last colonscopy 2011: no polyps.    Patient Active Problem List   Diagnosis Date Noted  . Thyroid dysfunction 06/19/2017  . Routine general medical examination at a health care facility 07/07/2015  . Sleep disturbance 07/07/2015  . Arthritis 03/30/2014  . Opiate withdrawal ([place]) 12/16/2011  . Cocaine abuse ([place]) 12/16/2011  . Backache 09/03/2008  . HYPERGLYCEMIA, BORDERLINE 09/03/2008  . ANEMIA-NOS 08/20/2008  . Anxiety and depression 08/20/2008  . Essential hypertension 08/20/2008  . ALLERGIC RHINITIS 08/20/2008  . Asthma 08/20/2008  . GERD  08/20/2008    Past Surgical History:  Procedure Laterality Date  . BREAST SURGERY  2004   Lumpectomy, right side-Benign  . COLONOSCOPY    . DILATION AND CURETTAGE OF UTERUS    . FOOT SURGERY  2006   right  . [place]  . MYOMECTOMY  1997  . THYROIDECTOMY, PARTIAL  2004   Left, benign  . VEIN SURGERY  1997   left arm     OB History    Gravida  1   Para      Term      Preterm      AB  1   Living        SAB      TAB      Ectopic      Multiple      Live Births               Home Medications    Prior to Admission medications   Medication Sig Start Date End Date Taking? Authorizing Provider  acetaminophen (TYLENOL) 500 MG tablet Take 2 tablets (1,000 mg total) by mouth every 6 (six) hours as needed. Patient taking differently: Take 1,000 mg by mouth every 6 (six) hours as needed for mild pain, fever or headache.  06/05/16   Charlesetta Shanks, MD  albuterol (PROVENTIL HFA;VENTOLIN HFA) 108 (90 Base) MCG/ACT inhaler Inhale 1-2 puffs into the lungs every 6 (six) hours as needed for wheezing  or shortness of breath. 06/19/17   Lance Sell, NP  budesonide-formoterol (SYMBICORT) 80-4.5 MCG/ACT inhaler Inhale 2 puffs into the lungs 2 (two) times daily. 08/14/17   Marrian Salvage, FNP  busPIRone (BUSPAR) 10 MG tablet Take 1 tablet (10 mg total) by mouth 2 (two) times daily. 10/02/18   Binnie Rail, MD  citalopram (CELEXA) 20 MG tablet Take 1 tablet (20 mg total) by mouth daily. 10/02/18   Binnie Rail, MD  diclofenac sodium (VOLTAREN) 1 % GEL Apply 4 g topically 4 (four) times daily as needed. Patient not taking: Reported on 10/02/2018 01/20/18   Hilts, Legrand Como, MD  guaiFENesin (ROBITUSSIN) 100 MG/5ML liquid Take 10 mLs (200 mg total) by mouth every 4 (four) hours as needed for cough. Patient not taking: Reported on 10/02/2018 08/10/17   Jacqlyn Larsen, PA-C  ipratropium-albuterol (DUONEB) 0.5-2.5 (3) MG/3ML SOLN Take 3 mLs by  nebulization every 6 (six) hours as needed. 05/31/14   Le, Thao P, DO  loratadine (CLARITIN) 10 MG tablet Take 10 mg by mouth daily as needed for allergies.    [provider]  losartan-hydrochlorothiazide (HYZAAR) 100-25 MG tablet Take 1 tablet by mouth daily. 10/02/18   Burns, Claudina Lick, MD  montelukast (SINGULAIR) 10 MG tablet Take 1 tablet (10 mg total) by mouth at bedtime. 08/14/17   Marrian Salvage, FNP  Spacer/Aero-Holding Chambers (AEROCHAMBER PLUS WITH MASK) inhaler Use as instructed 06/05/16   Charlesetta Shanks, MD  traZODone (DESYREL) 50 MG tablet Take 1 tablet (50 mg total) by mouth at bedtime. 10/02/18   Binnie Rail, MD  vitamin B-12 (CYANOCOBALAMIN) 500 MCG tablet Take 500 mcg by mouth daily.    [provider]    Family History Family History  Problem Relation Age of Onset  . Hypertension Mother   . Diabetes Sister   . Arthritis Other   . Diabetes Other   . Pancreatic cancer Maternal Uncle   . Diabetes Maternal Grandmother   . Ovarian cancer Maternal Grandmother   . Colon cancer Maternal Grandmother   . Esophageal cancer Neg Hx   . Stomach cancer Neg Hx   . Rectal cancer Neg Hx     Social History Social History   Tobacco Use  . Smoking status: Current Every Day Smoker    Packs/day: 0.40    Years: 7.00    Pack years: 2.80    Types: Cigarettes  . Smokeless tobacco: Never Used  . Tobacco comment: Married 2009-lives with spouse, no kids  Substance Use Topics  . Alcohol use: No    Alcohol/week: 0.0 standard drinks  . Drug use: Yes    Types: Cocaine    Comment: Last time usage was December 2016 - heroin and cocaine     Allergies   Naproxen   Review of Systems Review of Systems  Ten systems reviewed and are negative for acute change, except as noted in the HPI.   Physical Exam Updated Vital Signs BP (!) 226/128   Pulse (!) 54   Temp 98.7 F (37.1 C) (Oral)   Resp 17   Ht 5\' 2"  (1.575 m)   Wt 94.3 kg   SpO2 100%   BMI 38.04  kg/m   Physical Exam Vitals signs and nursing note reviewed.  Constitutional:      General: She is not in acute distress.    Appearance: She is well-developed. She is not diaphoretic.  HENT:     Head: Normocephalic and atraumatic.  Right Ear: External ear normal.     Left Ear: External ear normal.     Mouth/Throat:     Pharynx: No oropharyngeal exudate.  Eyes:     General: No scleral icterus.    Conjunctiva/sclera: Conjunctivae normal.     Pupils: Pupils are equal, round, and reactive to light.     Comments: No horizontal, vertical or rotational nystagmus  Neck:     Musculoskeletal: Normal range of motion and neck supple.     Thyroid: No thyromegaly.     Vascular: No JVD.     Comments: Full active and passive ROM without pain No midline or paraspinal tenderness No nuchal rigidity or meningeal signs Cardiovascular:     Rate and Rhythm: Normal rate and regular rhythm.     Heart sounds: Normal heart sounds. No murmur. No friction rub. No gallop.   Pulmonary:     Effort: Pulmonary effort is normal. No respiratory distress.     Breath sounds: Normal breath sounds. No wheezing or rales.  Abdominal:     General: Bowel sounds are normal. There is no distension.     Palpations: Abdomen is soft. There is no mass.     Tenderness: There is no abdominal tenderness. There is no guarding or rebound.  Musculoskeletal: Normal range of motion.        General: No tenderness.     Comments: No meningismus  Lymphadenopathy:     Cervical: No cervical adenopathy.  Skin:    General: Skin is warm and dry.     Findings: No rash.  Neurological:     Mental Status: She is alert and oriented to person, place, and time.     Cranial Nerves: No cranial nerve deficit.     Motor: No abnormal muscle tone.     Coordination: Coordination normal.     Deep Tendon Reflexes: Reflexes are normal and symmetric.     Comments: Mental Status:  Alert, oriented, thought content appropriate. Speech fluent without  evidence of aphasia. Able to follow 2 step commands without difficulty.  Cranial Nerves:  II:  Peripheral visual fields grossly normal, pupils equal, round, reactive to light III,IV, VI: ptosis not present, extra-ocular motions intact bilaterally  V,VII: smile symmetric, facial light touch sensation equal VIII: hearing grossly normal bilaterally  IX,X: midline uvula rise  XI: bilateral shoulder shrug equal and strong XII: midline tongue extension  Motor:  5/5 in upper and lower extremities bilaterally including strong and equal grip strength and dorsiflexion/plantar flexion Sensory: Pinprick and light touch normal in all extremities.  Deep Tendon Reflexes: 2+ and symmetric  Cerebellar: normal finger-to-nose with bilateral upper extremities Gait: normal gait and balance CV: distal pulses palpable throughout   Psychiatric:        Behavior: Behavior normal.        Thought Content: Thought content normal.        Judgment: Judgment normal.      ED Treatments / Results  Labs (all labs ordered are listed, but only abnormal results are displayed) Labs Reviewed  CBC - Abnormal; Notable for the following components:      Result Value   RDW 16.1 (*)    All other components within normal limits  URINALYSIS, ROUTINE W REFLEX MICROSCOPIC - Abnormal; Notable for the following components:   Color, Urine STRAW (*)    All other components within normal limits  BASIC METABOLIC PANEL  CBG MONITORING, ED    EKG None  Radiology No results found.  Procedures  Procedures (including critical care time)  Medications Ordered in ED Medications  sodium chloride flush (NS) 0.9 % injection 3 mL (has no administration in time range)     Initial Impression / Assessment and Plan / ED Course  I have reviewed the triage vital signs and the nursing notes.  Pertinent labs & imaging results that were available during my care of the patient were reviewed by me and considered in my medical decision  making (see chart for details).  Clinical Course as of Oct 02 2344  Fri Oct 03, 2018  2237 Patient is feeling much better. Eating. Headache is resolved   [AH]    Clinical Course User Index [AH] Margarita Mail, PA-C       OF:BPZWCHEN and tooth pain VS: BP (!) 201/66   Pulse 78   Temp 98.7 F (37.1 C) (Oral)   Resp (!) 22   Ht 5\' 2"  (1.575 m)   Wt 94.3 kg   SpO2 100%   BMI 38.04 kg/m  ID:[id number] is gathered by patient and EMR. TIR:[id number] considerations for headache include subarachnoid hemorrhage, meningitis, temporal arteritis, glaucoma, cerebral ischemia, carotid/vertebral dissection, intracranial tumor, Venous sinus thrombosis, carbon monoxide poisoning, acute or chronic subdural hemorrhage.  Other considerations include: Migraine, Cluster headache, Hypertension, Caffeine, alcohol, or drug withdrawal, Pseudotumor cerebri, Arteriovenous malformation, Head injury, Neurocysticercosis, Post-lumbar puncture, Preeclampsia, Tension headache, Sinusitis, Cervical arthritis, Refractive error causing strain, Dental abscess, Otitis media, Temporomandibular joint syndrome, Depression, Somatoform disorder (eg, somatization) Trigeminal neuralgia, Glossopharyngeal neuralgia. Labs: I reviewed the labs which show mild hypoglycemia, urine without infection.  BMP shows no evidence of acute kidney injury or renal insufficiency.  No abnormality on the CBC.   Imaging: I personally reviewed the images (ct head) which show(s) no acute abnormalities, no evidence of subarachnoid hemorrhage EKG:  EKG Interpretation  Date/Time:  Friday Oct 03 2018 19:08:52 EDT Ventricular Rate:  57 PR Interval:  152 QRS Duration: 64 QT Interval:  458 QTC Calculation: 445 R Axis:   -4 Text Interpretation:  Sinus bradycardia Non-specific ST-t changes No significant change since last tracing Confirmed by Lajean Saver [phone]) on 10/03/2018 9:34:28 PM       [id number] headache completely resolved with treatment.  She  still has throbbing pain in her tooth.  Blood pressures are improved but still significantly elevated systolically.  Patient is advised to continue taking her home medications.  Follow very closely with her primary care physician.  Discussed return precautions with the patient.  I reviewed the drug database and she has no active pain medication prescriptions.  Will give tramadol and amoxicillin for her tooth.  She is advised to follow-up with the dentist.  She has no evidence of Ludwig's angina Patient disposition: Discharge Patient condition: Improved. The patient appears reasonably screened and/or stabilized for discharge and I doubt any other medical condition or other [facility] requiring further screening, evaluation, or treatment in the ED at this time prior to discharge. I have discussed lab and/or imaging findings with the patient and answered all questions/concerns to the best of my ability. I have discussed return precautions and OP follow up.     Final Clinical Impressions(s) / ED Diagnoses   Final diagnoses:  None    ED Discharge Orders    None       Margarita Mail, PA-C 10/03/18 2351    Lajean Saver, MD 10/04/18 1500

## 2018-10-04 MED FILL — AMOXICILLIN 500 MG CAPSULE: 500 | 7 days supply | Qty: 21 | Fill #0

## 2018-10-06 DIAGNOSIS — M1990 Unspecified osteoarthritis, unspecified site: Secondary | ICD-10-CM | POA: Diagnosis not present

## 2018-10-06 DIAGNOSIS — I10 Essential (primary) hypertension: Secondary | ICD-10-CM | POA: Diagnosis not present

## 2018-10-06 DIAGNOSIS — F1421 Cocaine dependence, in remission: Secondary | ICD-10-CM | POA: Diagnosis not present

## 2018-10-06 DIAGNOSIS — J4520 Mild intermittent asthma, uncomplicated: Secondary | ICD-10-CM | POA: Diagnosis not present

## 2018-10-06 MED FILL — DICLOFENAC SODIUM 1 % GEL: 1 | 25 days supply | Qty: 100 | Fill #0

## 2018-10-06 MED FILL — traMADol HCL 50 MG TABS: 50 | 6 days supply | Qty: 15 | Fill #0

## 2018-10-06 MED FILL — MONTELUKAST SOD 10 MG TAB: 10 | 90 days supply | Qty: 90 | Fill #0

## 2018-10-08 DIAGNOSIS — J4520 Mild intermittent asthma, uncomplicated: Secondary | ICD-10-CM | POA: Diagnosis not present

## 2018-10-08 DIAGNOSIS — I10 Essential (primary) hypertension: Secondary | ICD-10-CM | POA: Diagnosis not present

## 2018-10-08 DIAGNOSIS — M1990 Unspecified osteoarthritis, unspecified site: Secondary | ICD-10-CM | POA: Diagnosis not present

## 2018-10-08 DIAGNOSIS — Z111 Encounter for screening for respiratory tuberculosis: Secondary | ICD-10-CM | POA: Diagnosis not present

## 2018-10-08 MED FILL — ALBUTEROL SULFATE HFA 108 (: 108 (90 BAS | 30 days supply | Qty: 18 | Fill #0

## 2018-10-14 MED FILL — AMLODIPINE BESYLATE 5 MG TA: 5 | 30 days supply | Qty: 30 | Fill #0

## 2018-10-16 MED FILL — PENICILLIN VK 500 MG TABLET: 500 | 14 days supply | Qty: 56 | Fill #0

## 2018-10-16 MED FILL — HYDROCODON-APAP 5-325: 5-325 | 5 days supply | Qty: 20 | Fill #0

## 2018-10-16 MED FILL — CHLORHEXIDINE 0.12% RINSE: 0.12 | 17 days supply | Qty: 473 | Fill #0

## 2018-10-22 MED FILL — traMADol HCL 50 MG TABS: 50 | 2 days supply | Qty: 5 | Fill #0

## 2018-10-27 MED FILL — NABUMETONE 500 MG TABLET: 500 | 30 days supply | Qty: 60 | Fill #0

## 2018-10-27 MED FILL — LOSARTAN-HCTZ 100-25 MG TAB: 100-25 | 30 days supply | Qty: 30 | Fill #0

## 2018-10-28 ENCOUNTER — Other Ambulatory Visit: Payer: Self-pay | Admitting: Internal Medicine

## 2018-10-28 MED FILL — CITALOPRAM HBR 40 MG TABLET: 40 | 90 days supply | Qty: 90 | Fill #0

## 2018-11-01 DIAGNOSIS — S81802A Unspecified open wound, left lower leg, initial encounter: Secondary | ICD-10-CM | POA: Diagnosis not present

## 2018-11-06 MED FILL — busPIRone HCL 10 MG TABS: 10 | 90 days supply | Qty: 180 | Fill #0

## 2018-11-07 MED FILL — AMLODIPINE BESYLATE 5 MG TA: 5 | 90 days supply | Qty: 90 | Fill #0

## 2018-11-10 ENCOUNTER — Other Ambulatory Visit: Payer: Self-pay | Admitting: Internal Medicine

## 2018-11-10 DIAGNOSIS — F329 Major depressive disorder, single episode, unspecified: Secondary | ICD-10-CM

## 2018-11-10 DIAGNOSIS — F32A Depression, unspecified: Secondary | ICD-10-CM

## 2018-11-10 DIAGNOSIS — G479 Sleep disorder, unspecified: Secondary | ICD-10-CM

## 2018-11-10 MED FILL — traZODone HCL 50 MG TABS: 50 | 30 days supply | Qty: 30 | Fill #0

## 2018-12-09 MED FILL — LOSARTAN-HCTZ 100-25 MG TAB: 100-25 | 30 days supply | Qty: 30 | Fill #0

## 2018-12-10 ENCOUNTER — Other Ambulatory Visit: Payer: Self-pay | Admitting: Internal Medicine

## 2018-12-10 DIAGNOSIS — F329 Major depressive disorder, single episode, unspecified: Secondary | ICD-10-CM

## 2018-12-10 DIAGNOSIS — G479 Sleep disorder, unspecified: Secondary | ICD-10-CM

## 2018-12-10 DIAGNOSIS — F32A Depression, unspecified: Secondary | ICD-10-CM

## 2019-01-15 MED FILL — LOSARTAN-HCTZ 100-25 MG TAB: 100-25 | 30 days supply | Qty: 30 | Fill #0

## 2019-01-16 MED FILL — NABUMETONE 500 MG TABLET: 500 | 60 days supply | Qty: 60 | Fill #1

## 2019-02-16 MED FILL — AMLODIPINE BESYLATE 5 MG TA: 5 | 30 days supply | Qty: 30 | Fill #0

## 2019-02-16 MED FILL — LOSARTAN-HCTZ 100-25 MG TAB: 100-25 | 30 days supply | Qty: 30 | Fill #0

## 2019-03-18 MED FILL — AMLODIPINE BESYLATE 5 MG TA: 5 | 30 days supply | Qty: 30 | Fill #0

## 2019-03-18 MED FILL — LOSARTAN-HCTZ 100-25 MG TAB: 100-25 | 30 days supply | Qty: 30 | Fill #0

## 2019-04-20 ENCOUNTER — Other Ambulatory Visit: Payer: Self-pay | Admitting: Internal Medicine

## 2019-04-20 DIAGNOSIS — G479 Sleep disorder, unspecified: Secondary | ICD-10-CM

## 2019-04-20 DIAGNOSIS — F419 Anxiety disorder, unspecified: Secondary | ICD-10-CM

## 2019-04-20 DIAGNOSIS — F329 Major depressive disorder, single episode, unspecified: Secondary | ICD-10-CM

## 2019-04-20 MED FILL — LOSARTAN-HCTZ 100-25 MG TAB: 100-25 | 30 days supply | Qty: 30 | Fill #1

## 2019-04-20 MED FILL — AMLODIPINE BESYLATE 5 MG TA: 5 | 30 days supply | Qty: 30 | Fill #1

## 2019-06-09 MED FILL — LOSARTAN-HCTZ 100-25 MG TAB: 100-25 | 90 days supply | Qty: 90 | Fill #0

## 2019-06-09 MED FILL — AMLODIPINE BESYLATE 5 MG TA: 5 | 90 days supply | Qty: 90 | Fill #0

## 2019-06-26 ENCOUNTER — Ambulatory Visit: Payer: Self-pay

## 2019-06-28 ENCOUNTER — Ambulatory Visit: Payer: 59 | Attending: Internal Medicine

## 2019-06-28 DIAGNOSIS — Z23 Encounter for immunization: Secondary | ICD-10-CM | POA: Insufficient documentation

## 2019-06-28 NOTE — Progress Notes (Signed)
   Covid-19 Vaccination Clinic  Name:  Wendy Carr    MRN: [id number] DOB: December 22, 1955  06/28/2019  Wendy Carr was observed post Covid-19 immunization for 15 minutes without incidence. She was provided with Vaccine Information Sheet and instruction to access the V-Safe system.   Wendy Carr was instructed to call 911 with any severe reactions post vaccine: Marland Kitchen Difficulty breathing  . Swelling of your face and throat  . A fast heartbeat  . A bad rash all over your body  . Dizziness and weakness    Immunizations Administered    Name Date Dose VIS Date Route   Pfizer COVID-19 Vaccine 06/28/2019  8:18 AM 0.3 mL 04/17/2019 Intramuscular   Manufacturer: [place]   Lot: [id number]   [place]: [id number]

## 2019-07-21 ENCOUNTER — Ambulatory Visit: Payer: 59 | Attending: Internal Medicine

## 2019-07-21 DIAGNOSIS — Z23 Encounter for immunization: Secondary | ICD-10-CM

## 2019-07-21 NOTE — Progress Notes (Signed)
   Covid-19 Vaccination Clinic  Name:  Wendy Carr    MRN: [id number] DOB: 01-May-1956  07/21/2019  [name] was observed post Covid-19 immunization for 15 minutes without incident. She was provided with Vaccine Information Sheet and instruction to access the V-Safe system.   [name] was instructed to call 911 with any severe reactions post vaccine: Marland Kitchen Difficulty breathing  . Swelling of face and throat  . A fast heartbeat  . A bad rash all over body  . Dizziness and weakness   Immunizations Administered    Name Date Dose VIS Date Route   Pfizer COVID-19 Vaccine 07/21/2019  2:50 PM 0.3 mL 04/17/2019 Intramuscular   Manufacturer: [place]   Lot: [id number]   [place]: [id number]

## 2019-07-29 DIAGNOSIS — R21 Rash and other nonspecific skin eruption: Secondary | ICD-10-CM | POA: Diagnosis not present

## 2019-07-29 DIAGNOSIS — R5383 Other fatigue: Secondary | ICD-10-CM | POA: Diagnosis not present

## 2019-07-29 DIAGNOSIS — R030 Elevated blood-pressure reading, without diagnosis of hypertension: Secondary | ICD-10-CM | POA: Diagnosis not present

## 2019-07-29 DIAGNOSIS — R519 Headache, unspecified: Secondary | ICD-10-CM | POA: Diagnosis not present

## 2019-09-02 MED FILL — AMLODIPINE BESYLATE 5 MG TA: 5 | 90 days supply | Qty: 90 | Fill #1

## 2019-09-02 MED FILL — LOSARTAN-HCTZ 100-25 MG TAB: 100-25 | 90 days supply | Qty: 90 | Fill #1

## 2019-11-15 ENCOUNTER — Emergency Department (HOSPITAL_COMMUNITY): Payer: 59

## 2019-11-15 ENCOUNTER — Encounter (HOSPITAL_COMMUNITY): Payer: Self-pay | Admitting: Emergency Medicine

## 2019-11-15 ENCOUNTER — Other Ambulatory Visit: Payer: Self-pay

## 2019-11-15 ENCOUNTER — Emergency Department (HOSPITAL_COMMUNITY)
Admission: EM | Admit: 2019-11-15 | Discharge: 2019-11-15 | Disposition: A | Discharge status: Home / Self Care | Payer: 59 | Attending: Emergency Medicine | Admitting: Emergency Medicine

## 2019-11-15 DIAGNOSIS — Y999 Unspecified external cause status: Secondary | ICD-10-CM | POA: Insufficient documentation

## 2019-11-15 DIAGNOSIS — I708 Atherosclerosis of other arteries: Secondary | ICD-10-CM | POA: Diagnosis not present

## 2019-11-15 DIAGNOSIS — Z79899 Other long term (current) drug therapy: Secondary | ICD-10-CM | POA: Diagnosis not present

## 2019-11-15 DIAGNOSIS — W010XXA Fall on same level from slipping, tripping and stumbling without subsequent striking against object, initial encounter: Secondary | ICD-10-CM | POA: Insufficient documentation

## 2019-11-15 DIAGNOSIS — F17210 Nicotine dependence, cigarettes, uncomplicated: Secondary | ICD-10-CM | POA: Diagnosis not present

## 2019-11-15 DIAGNOSIS — S83012A Lateral subluxation of left patella, initial encounter: Secondary | ICD-10-CM | POA: Diagnosis not present

## 2019-11-15 DIAGNOSIS — J45909 Unspecified asthma, uncomplicated: Secondary | ICD-10-CM | POA: Insufficient documentation

## 2019-11-15 DIAGNOSIS — M25562 Pain in left knee: Secondary | ICD-10-CM | POA: Diagnosis not present

## 2019-11-15 DIAGNOSIS — Y929 Unspecified place or not applicable: Secondary | ICD-10-CM | POA: Insufficient documentation

## 2019-11-15 DIAGNOSIS — W19XXXA Unspecified fall, initial encounter: Secondary | ICD-10-CM

## 2019-11-15 DIAGNOSIS — Y9389 Activity, other specified: Secondary | ICD-10-CM | POA: Diagnosis not present

## 2019-11-15 DIAGNOSIS — I10 Essential (primary) hypertension: Secondary | ICD-10-CM | POA: Diagnosis not present

## 2019-11-15 DIAGNOSIS — E039 Hypothyroidism, unspecified: Secondary | ICD-10-CM | POA: Insufficient documentation

## 2019-11-15 DIAGNOSIS — M1712 Unilateral primary osteoarthritis, left knee: Secondary | ICD-10-CM | POA: Diagnosis not present

## 2019-11-15 MED ORDER — TRAMADOL HCL 50 MG PO TABS: 50.0000 mg | ORAL_TABLET | Freq: Four times a day (QID) | ORAL | 0 refills | Status: DC | PRN

## 2019-11-15 MED ORDER — BACITRACIN ZINC 500 UNIT/GM EX OINT
TOPICAL_OINTMENT | Freq: Once | CUTANEOUS | Status: AC
  Filled 2019-11-15: qty 0.9

## 2019-11-15 NOTE — Discharge Instructions (Addendum)
You have been seen today for left knee pain. Please read and follow all provided instructions. Return to the emergency room for worsening condition or new concerning symptoms.    1. Medications:  Prescription sent to your pharmacy for Tramadol. This is for severe pain only. Do not work or drive while taking as it can make you drowsy.   Continue usual home medications Take medications as prescribed. Please review all of the medicines and only take them if you do not have an allergy to them.   2. Treatment: ice and elevate knee. Use the ace wrap to help with swelling  3. Follow Up:  Please follow up with primary care provider by scheduling an appointment as soon as possible for a visit  -Please follow up with [facility] if you continue to have pain   It is also a possibility that you have an allergic reaction to any of the medicines that you have been prescribed - Everybody reacts differently to medications and while MOST people have no trouble with most medicines, you may have a reaction such as nausea, vomiting, rash, swelling, shortness of breath. If this is the case, please stop taking the medicine immediately and contact your physician.  ?

## 2019-11-15 NOTE — ED Triage Notes (Signed)
Pt c/o left knee pain after a fall while getting out of the car today. Reports she did hit her head on the car, denies LOC.

## 2019-11-15 NOTE — ED Provider Notes (Signed)
[facility] EMERGENCY DEPARTMENT Provider Note   CSN: [id number] Arrival date & time: 11/15/19  1453     History Chief Complaint  Patient presents with   Knee Pain   Fall    Wendy Carr is a 64 y.o. female with past medical history significant for asthma, arthritis, hypertension, hypothyroidism, substance abuse.  Patient is not on anticoagulation.  HPI Patient presents to emergency department today with chief complaint of left knee pain after mechanical fall approximately 1 hour prior to arrival.  Patient states she was getting out of the car and her sandal got tangled up in her long dress causing her to fall.  She landed on all fours with most of the weight on her left knee.  She denies hitting her head or loss of consciousness.  She was able to get up and ambulate after the fall.  She is endorsing throbbing pain localized to left knee.  Pain is worse with movement and ambulation.  She scraped her knee in the fall and cleaned it with alcohol prior to arrival. She rates pain 7/10 in severity. She did not take any medications for pain prior to arrival. She denies any fever, chills, neck pain, swelling, lower extremity numbness, weakness, tingling, myalgias, joint swelling, gait problem. Tetanus is up to date.      Past Medical History:  Diagnosis Date   ALLERGIC RHINITIS    Allergy    ANEMIA-NOS    ANXIETY    Arthritis    ASTHMA    Blood transfusion without reported diagnosis    GERD    HYPERGLYCEMIA, BORDERLINE    HYPERTENSION    HYPOTHYROIDISM    Personal history of colonic polyps 2011   last colonscopy 2011: no polyps.    Patient Active Problem List   Diagnosis Date Noted   Thyroid dysfunction 06/19/2017   Routine general medical examination at a health care facility 07/07/2015   Sleep disturbance 07/07/2015   Arthritis 03/30/2014   Opiate withdrawal ([place]) 12/16/2011   Cocaine abuse ([place]) 12/16/2011   Backache 09/03/2008    HYPERGLYCEMIA, BORDERLINE 09/03/2008   ANEMIA-NOS 08/20/2008   Anxiety and depression 08/20/2008   Essential hypertension 08/20/2008   ALLERGIC RHINITIS 08/20/2008   Asthma 08/20/2008   GERD 08/20/2008    Past Surgical History:  Procedure Laterality Date   BREAST SURGERY  2004   Lumpectomy, right side-Benign   COLONOSCOPY     DILATION AND CURETTAGE OF UTERUS     FOOT SURGERY  2006   right   [place] ?[place]   THYROIDECTOMY, PARTIAL  2004   Left, benign   VEIN SURGERY  1997   left arm     OB History    Gravida  1   Para      Term      Preterm      AB  1   Living        SAB      TAB      Ectopic      Multiple      Live Births              Family History  Problem Relation Age of Onset   Hypertension Mother    Diabetes Sister    Arthritis Other    Diabetes Other    Pancreatic cancer Maternal Uncle    Diabetes Maternal Grandmother    Ovarian cancer Maternal Grandmother  Colon cancer Maternal Grandmother    Esophageal cancer Neg Hx    Stomach cancer Neg Hx    Rectal cancer Neg Hx     Social History   Tobacco Use   Smoking status: Current Every Day Smoker    Packs/day: 0.40    Years: 7.00    Pack years: 2.80    Types: Cigarettes   Smokeless tobacco: Never Used   Tobacco comment: Married 2009-lives with spouse, no kids  Substance Use Topics   Alcohol use: No    Alcohol/week: 0.0 standard drinks   Drug use: Yes    Types: Cocaine    Comment: Last time usage was December 2016 - heroin and cocaine    Home Medications Prior to Admission medications   Medication Sig Start Date End Date Taking? Authorizing Provider  acetaminophen (TYLENOL) 500 MG tablet Take 2 tablets (1,000 mg total) by mouth every 6 (six) hours as needed. Patient taking differently: Take 1,000 mg by mouth every 6 (six) hours as needed for mild pain, fever or headache.  06/05/16   Charlesetta Shanks, MD    albuterol (PROVENTIL HFA;VENTOLIN HFA) 108 (90 Base) MCG/ACT inhaler Inhale 1-2 puffs into the lungs every 6 (six) hours as needed for wheezing or shortness of breath. 06/19/17   Lance Sell, NP  amoxicillin (AMOXIL) 500 MG capsule Take 1 capsule (500 mg total) by mouth 3 (three) times daily. 10/03/18   Harris, Vernie Shanks, PA-C  BIOTIN PO Take 1 tablet by mouth daily.    [provider]  budesonide-formoterol (SYMBICORT) 80-4.5 MCG/ACT inhaler Inhale 2 puffs into the lungs 2 (two) times daily. 08/14/17   Marrian Salvage, FNP  busPIRone (BUSPAR) 10 MG tablet Take 1 tablet (10 mg total) by mouth 2 (two) times daily. 10/02/18   Binnie Rail, MD  Cholecalciferol (VITAMIN D3 PO) Take 1 tablet by mouth daily.    [provider]  citalopram (CELEXA) 20 MG tablet Take 1 tablet (20 mg total) by mouth daily. 10/02/18   Binnie Rail, MD  diclofenac sodium (VOLTAREN) 1 % GEL Apply 4 g topically 4 (four) times daily as needed. Patient not taking: Reported on 10/02/2018 01/20/18   Hilts, Legrand Como, MD  Fe Fum-Fe Poly-Vit C-Lactobac (FUSION PO) Take 1 tablet by mouth daily.    [provider]  guaiFENesin (ROBITUSSIN) 100 MG/5ML liquid Take 10 mLs (200 mg total) by mouth every 4 (four) hours as needed for cough. Patient not taking: Reported on 10/02/2018 08/10/17   Jacqlyn Larsen, PA-C  ipratropium-albuterol (DUONEB) 0.5-2.5 (3) MG/3ML SOLN Take 3 mLs by nebulization every 6 (six) hours as needed. Patient taking differently: Take 3 mLs by nebulization every 6 (six) hours as needed (shortness of breath/wheezing).  05/31/14   Le, Thao P, DO  loratadine (CLARITIN) 10 MG tablet Take 10 mg by mouth daily as needed for allergies.    [provider]  losartan-hydrochlorothiazide (HYZAAR) 100-25 MG tablet Take 1 tablet by mouth daily. 10/02/18   Burns, Claudina Lick, MD  montelukast (SINGULAIR) 10 MG tablet Take 1 tablet (10 mg total) by mouth at bedtime. Patient taking differently: Take  10 mg by mouth at bedtime as needed (seasonal allergies).  08/14/17   Marrian Salvage, FNP  Spacer/Aero-Holding Chambers (AEROCHAMBER PLUS WITH MASK) inhaler Use as instructed 06/05/16   Charlesetta Shanks, MD  traMADol (ULTRAM) 50 MG tablet Take 1 tablet (50 mg total) by mouth every 6 (six) hours as needed. 11/15/19   Jarmon Javid, Harley Hallmark, PA-C  traZODone (DESYREL) 50 MG tablet TAKE 1 TABLET (50 MG TOTAL) BY MOUTH AT BEDTIME. 11/10/18   Burns, Claudina Lick, MD  vitamin B-12 (CYANOCOBALAMIN) 500 MCG tablet Take 500 mcg by mouth daily.    [provider]    Allergies    Naproxen  Review of Systems   Review of Systems  All other systems are reviewed and are negative for acute change except as noted in the HPI.   Physical Exam Updated Vital Signs BP (!) 158/70 (BP Location: Right Arm)    Pulse 88    Temp 98 F (36.7 C) (Oral)    Resp (!) 22    Ht 5\' 2"  (1.575 m)    Wt 91.2 kg    SpO2 100%    BMI 36.76 kg/m   Physical Exam Vitals and nursing note reviewed.  Constitutional:      General: She is not in acute distress.    Appearance: She is not ill-appearing.  HENT:     Head: Normocephalic and atraumatic.     Comments: No tenderness to palpation of skull. No deformities or crepitus noted. No open wounds, abrasions or lacerations.    Right Ear: Tympanic membrane and external ear normal.     Left Ear: Tympanic membrane and external ear normal.     Nose: Nose normal.     Mouth/Throat:     Mouth: Mucous membranes are moist.     Pharynx: Oropharynx is clear.  Eyes:     General: No scleral icterus.       Right eye: No discharge.        Left eye: No discharge.     Extraocular Movements: Extraocular movements intact.     Conjunctiva/sclera: Conjunctivae normal.     Pupils: Pupils are equal, round, and reactive to light.  Neck:     Vascular: No JVD.  Cardiovascular:     Rate and Rhythm: Normal rate and regular rhythm.     Pulses: Normal pulses.          Radial pulses are 2+ on the  right side and 2+ on the left side.       Dorsalis pedis pulses are 2+ on the right side and 2+ on the left side.     Heart sounds: Normal heart sounds.  Pulmonary:     Comments: Lungs clear to auscultation in all fields. Symmetric chest rise. No wheezing, rales, or rhonchi. Abdominal:     Comments: Abdomen is soft, non-distended, and non-tender in all quadrants. No rigidity, no guarding. No peritoneal signs.  Musculoskeletal:        General: Normal range of motion.     Cervical back: Normal range of motion.     Left knee: No swelling, deformity, effusion, erythema, lacerations or crepitus. No tenderness. Normal meniscus and normal patellar mobility.     Instability Tests: Anterior drawer test negative. Posterior drawer test negative.       Legs:     Comments: Full ROM of all extremities. Patient ambulating with normal gait.  Pelvis is stable   Skin:    General: Skin is warm and dry.     Capillary Refill: Capillary refill takes less than 2 seconds.  Neurological:     Mental Status: She is oriented to person, place, and time.     GCS: GCS eye subscore is 4. GCS verbal subscore is 5. GCS motor subscore is 6.     Comments: Speech is clear and goal oriented, follows commands CN III-XII  intact, no facial droop Normal strength in upper and lower extremities bilaterally including dorsiflexion and plantar flexion, strong and equal grip strength Sensation normal to light and sharp touch Moves extremities without ataxia, coordination intact Normal finger to nose and rapid alternating movements Normal gait and balance   Psychiatric:        Behavior: Behavior normal.     ED Results / Procedures / Treatments   Labs (all labs ordered are listed, but only abnormal results are displayed) Labs Reviewed - No data to display  EKG None  Radiology DG Knee Complete 4 Views Left  Result Date: 11/15/2019 CLINICAL DATA:  Pain following fall EXAM: LEFT KNEE - COMPLETE 4+ VIEW COMPARISON:   January 20, 2018 FINDINGS: Frontal, lateral, and bilateral oblique views were obtained. There is no fracture or dislocation. There is slight lateral patellar subluxation. No joint effusion. There is slight narrowing of the patellofemoral joint. Other joint spaces appear normal. There is slight spurring medially and in the posterior patellofemoral joint region. There is mild chondrocalcinosis laterally. There is slight popliteal artery vascular calcification. IMPRESSION: Slight lateral patellar subluxation. No fracture, dislocation, or joint effusion. Relatively mild osteoarthritic change. Mild chondrocalcinosis may be seen with osteoarthritis or with calcium pyrophosphate deposition disease. Mild popliteal artery atherosclerotic calcification noted. Electronically Signed   By: Lowella Grip III M.D.   On: 11/15/2019 15:41    Procedures Procedures (including critical care time)  Medications Ordered in ED Medications  bacitracin ointment ( Topical Given 11/15/19 1954)    ED Course  I have reviewed the triage vital signs and the nursing notes.  Pertinent labs & imaging results that were available during my care of the patient were reviewed by me and considered in my medical decision making (see chart for details).    MDM Rules/Calculators/A&P                          History provided by patient with additional history obtained from chart review.    Patient presents to the ED with complaints of pain to the left knee s/p injury mechanical fall. Triage note states patient did hit her head on the car and denies LOC.  I asked patient about this multiple times and she denies hitting her head to me.  On exam she has no signs of a head injury. She has a normal neuro exam. Left lower extremity exam without obvious deformity, small superficial abrasion that is not amenable to suture repair. ROM intact. Tender to palpation over patella.  NVI distally. Xray negative for fracture/dislocation. Xray does  show slight lateral patellar subluxation.  On physical exam there are no signs of dislocation.  Patella is stable. Therapeutic splint provided. Patient drove herself to the ED so am unable to give her narcotic pain medicine here. I have reviewed the PDMP during this encounter. I will send 4 pills of tramadol to her pharmacy, she has taken this before. No recent narcotic prescriptions.I discussed results, treatment plan, need for follow-up, and return precautions with the patient. Provided opportunity for questions, patient confirmed understanding and are in agreement with plan.    Portions of this note were generated with [employer]. Dictation errors may occur despite best attempts at proofreading.  Final Clinical Impression(s) / ED Diagnoses Final diagnoses:  Fall, initial encounter  Acute pain of left knee    Rx / DC Orders ED Discharge Orders         Ordered  traMADol (ULTRAM) 50 MG tablet  Every 6 hours PRN     Discontinue  Reprint     11/15/19 1929           Flint Melter 11/15/19 2045    Pattricia Boss, MD 11/16/19 1217

## 2019-11-18 DIAGNOSIS — Z111 Encounter for screening for respiratory tuberculosis: Secondary | ICD-10-CM | POA: Diagnosis not present

## 2019-11-23 ENCOUNTER — Encounter: Payer: Self-pay | Admitting: Family Medicine

## 2019-11-23 ENCOUNTER — Ambulatory Visit (INDEPENDENT_AMBULATORY_CARE_PROVIDER_SITE_OTHER): Payer: 59 | Admitting: Family Medicine

## 2019-11-23 ENCOUNTER — Other Ambulatory Visit: Payer: Self-pay

## 2019-11-23 DIAGNOSIS — M25562 Pain in left knee: Secondary | ICD-10-CM

## 2019-11-23 MED ORDER — TRAMADOL HCL 50 MG PO TABS: 50.0000 mg | ORAL_TABLET | Freq: Four times a day (QID) | ORAL | 0 refills | Status: DC | PRN

## 2019-11-23 MED FILL — traMADol HCL 50 MG TABS: 50 | 5 days supply | Qty: 20 | Fill #0

## 2019-11-23 NOTE — Progress Notes (Signed)
Fell on knee last Saturday onto knee and elbow  Having left knee pain since Diclofenac isnt helping  No cortisone done  Been icing which has reduced swelling

## 2019-11-23 NOTE — Progress Notes (Signed)
Office Visit Note   Patient: Wendy Carr           Date of Birth: November 13, 1955           MRN: [id number] Visit Date: 11/23/2019 Requested by: [facility] @ [place],  [place] [address] PCP: [facility] @ [place]  Subjective: Chief Complaint  Patient presents with   Left Knee - Pain    HPI: Patient is a 64 year old female presenting to clinic with concerns about acute left knee pain after direct fall onto her knee approximately 8 days ago. Patient states she tripped over a long skirt, and landed directly on her knee. She presented immediately to the ED where XRays were negative for fracture, and she was given an ACE-Wrap and tramadol.  Since her fall, patient states that she has had difficulty with ambulation, favoring her left leg- which she feels has put stress on her right ankle.  Though her pain has improved somewhat since the initial injury, she remains with significant discomfort around the knee cap. No locking/popping, or other mechanical knee symptoms.  She does have a history of osteoarthritis in this knee, for which she was seen 2 years ago and prescribed Voltaren gel.  She states she has tried using this for her current pain, with only minimal improvement. She would like something else for pain control.              ROS:   All other systems were reviewed and are negative.  Objective: Vital Signs: There were no vitals taken for this visit.  Physical Exam:  General:  Alert and oriented, in no acute distress. Pulm:  Breathing unlabored. Psy:  Normal mood, congruent affect. Skin:  Superficial abrasion over L patellar tendon. Appears to be healing well with no surrounding erythema or drainage.  L KNEE: No obvious swelling, bruising, or deformity. Antalgic gait favoring left leg, with knee held in extension.  Patellar crepitus noted on knee flexion/extension.  Significant tenderness to palpation on patella and  patellar tendon. Able to lift leg off table, with no specific tenderness or defects over quadricepts musculature.  No pain/laxity with anterior/posterior drawer, or varus/valgus stress of the knee. Negative Mc[place]ray.   Imaging: EXAM: [id number] LEFT KNEE - COMPLETE 4+ VIEW  COMPARISON:  January 20, 2018  FINDINGS: Frontal, lateral, and bilateral oblique views were obtained. There is no fracture or dislocation. There is slight lateral patellar subluxation. No joint effusion. There is slight narrowing of the patellofemoral joint. Other joint spaces appear normal. There is slight spurring medially and in the posterior patellofemoral joint region. There is mild chondrocalcinosis laterally. There is slight popliteal artery vascular calcification.  IMPRESSION: Slight lateral patellar subluxation. No fracture, dislocation, or joint effusion. Relatively mild osteoarthritic change. Mild chondrocalcinosis may be seen with osteoarthritis or with calcium pyrophosphate deposition disease.  Mild popliteal artery atherosclerotic calcification noted.   Electronically Signed   By: Lowella Grip III M.D.   On: 11/15/2019 15:41  Assessment & Plan: 64 year old female presenting to clinic approximately 8 days following a direct blow to left patella due to mechanical fall.  Examination as above which is significant for marked tenderness to palpation over the patella and patellar tendon.  Suspect underlying contusion as a cause to her symptoms.  Will place in knee immobilizer to rest the patellar tendon.  Patient requesting pain medications due to the significant discomfort, will give small amount of tramadol to improve her current symptoms.  Recommended  return to clinic if no improvement with 2 to 3 weeks of bracing.  Patient has no further questions or concerns.    Procedures: No procedures performed  No notes on file     PMFS History: Patient Active Problem List   Diagnosis Date  Noted   Thyroid dysfunction 06/19/2017   Routine general medical examination at a health care facility 07/07/2015   Sleep disturbance 07/07/2015   Arthritis 03/30/2014   Opiate withdrawal ([place]) 12/16/2011   Cocaine abuse ([place]) 12/16/2011   Backache 09/03/2008   HYPERGLYCEMIA, BORDERLINE 09/03/2008   ANEMIA-NOS 08/20/2008   Anxiety and depression 08/20/2008   Essential hypertension 08/20/2008   ALLERGIC RHINITIS 08/20/2008   Asthma 08/20/2008   GERD 08/20/2008   Past Medical History:  Diagnosis Date   ALLERGIC RHINITIS    Allergy    ANEMIA-NOS    ANXIETY    Arthritis    ASTHMA    Blood transfusion without reported diagnosis    GERD    HYPERGLYCEMIA, BORDERLINE    HYPERTENSION    HYPOTHYROIDISM    Personal history of colonic polyps 2011   last colonscopy 2011: no polyps.    Family History  Problem Relation Age of Onset   Hypertension Mother    Diabetes Sister    Arthritis Other    Diabetes Other    Pancreatic cancer Maternal Uncle    Diabetes Maternal Grandmother    Ovarian cancer Maternal Grandmother    Colon cancer Maternal Grandmother    Esophageal cancer Neg Hx    Stomach cancer Neg Hx    Rectal cancer Neg Hx     Past Surgical History:  Procedure Laterality Date   BREAST SURGERY  2004   Lumpectomy, right side-Benign   COLONOSCOPY     DILATION AND CURETTAGE OF UTERUS     FOOT SURGERY  2006   right   [place] ?[place]   THYROIDECTOMY, PARTIAL  2004   Left, benign   VEIN SURGERY  1997   left arm   Social History   Occupational History   Not on file  Tobacco Use   Smoking status: Current Every Day Smoker    Packs/day: 0.40    Years: 7.00    Pack years: 2.80    Types: Cigarettes   Smokeless tobacco: Never Used   Tobacco comment: Married 2009-lives with spouse, no kids  Substance and Sexual Activity   Alcohol use: No    Alcohol/week: 0.0 standard drinks    Drug use: Yes    Types: Cocaine    Comment: Last time usage was December 2016 - heroin and cocaine   Sexual activity: Yes    Partners: Male    Birth control/protection: Abstinence

## 2019-11-23 NOTE — Progress Notes (Signed)
I saw and examined the patient with Dr. Elouise Munroe and agree with assessment and plan as outlined.    Left knee pain s/p fall, tender over patella tendon.  Extensor mechanism intact.    Will use immobilizer brace for 1-2 weeks, tramadol as needed.  Ultrasound imaging if pain persists.

## 2019-12-02 MED FILL — AMLODIPINE BESYLATE 5 MG TA: 5 | 30 days supply | Qty: 30 | Fill #0

## 2019-12-02 MED FILL — LOSARTAN-HCTZ 100-25 MG TAB: 100-25 | 30 days supply | Qty: 30 | Fill #0

## 2020-01-21 DIAGNOSIS — H52223 Regular astigmatism, bilateral: Secondary | ICD-10-CM | POA: Diagnosis not present

## 2020-01-21 DIAGNOSIS — H524 Presbyopia: Secondary | ICD-10-CM | POA: Diagnosis not present

## 2020-01-21 DIAGNOSIS — H5203 Hypermetropia, bilateral: Secondary | ICD-10-CM | POA: Diagnosis not present

## 2020-03-04 ENCOUNTER — Other Ambulatory Visit (HOSPITAL_COMMUNITY): Payer: Self-pay | Admitting: Nurse Practitioner

## 2020-03-04 DIAGNOSIS — J4541 Moderate persistent asthma with (acute) exacerbation: Secondary | ICD-10-CM | POA: Diagnosis not present

## 2020-03-04 DIAGNOSIS — H1032 Unspecified acute conjunctivitis, left eye: Secondary | ICD-10-CM | POA: Diagnosis not present

## 2020-03-04 DIAGNOSIS — R059 Cough, unspecified: Secondary | ICD-10-CM | POA: Diagnosis not present

## 2020-03-04 DIAGNOSIS — Z03818 Encounter for observation for suspected exposure to other biological agents ruled out: Secondary | ICD-10-CM | POA: Diagnosis not present

## 2020-03-04 MED FILL — POLYMYXIN B/TMP EYE DROPS: 10000-0.1 | 7 days supply | Qty: 10 | Fill #0

## 2020-03-04 MED FILL — ALBUTEROL 0.083% INHAL SOLN: (2.5 MG/3ML | 6 days supply | Qty: 75 | Fill #0

## 2020-03-04 MED FILL — BENZONATATE 100 MG CAPS: 100 | 5 days supply | Qty: 30 | Fill #0

## 2020-03-04 MED FILL — ALBUTEROL SULFATE HFA 108 (: 108 (90 BAS | 17 days supply | Qty: 18 | Fill #0

## 2020-03-04 MED FILL — LOSARTAN-HCTZ 100-25 MG TAB: 100-25 | 30 days supply | Qty: 30 | Fill #0

## 2020-07-10 ENCOUNTER — Other Ambulatory Visit (HOSPITAL_COMMUNITY): Payer: Self-pay | Admitting: Emergency Medicine

## 2020-07-10 ENCOUNTER — Emergency Department (HOSPITAL_COMMUNITY): Payer: 59

## 2020-07-10 ENCOUNTER — Other Ambulatory Visit: Payer: Self-pay

## 2020-07-10 ENCOUNTER — Emergency Department (HOSPITAL_COMMUNITY)
Admission: EM | Admit: 2020-07-10 | Discharge: 2020-07-10 | Disposition: A | Discharge status: Home / Self Care | Payer: 59 | Attending: Emergency Medicine | Admitting: Emergency Medicine

## 2020-07-10 DIAGNOSIS — J45909 Unspecified asthma, uncomplicated: Secondary | ICD-10-CM | POA: Insufficient documentation

## 2020-07-10 DIAGNOSIS — R519 Headache, unspecified: Secondary | ICD-10-CM | POA: Diagnosis not present

## 2020-07-10 DIAGNOSIS — Z79899 Other long term (current) drug therapy: Secondary | ICD-10-CM | POA: Insufficient documentation

## 2020-07-10 DIAGNOSIS — I10 Essential (primary) hypertension: Secondary | ICD-10-CM | POA: Diagnosis not present

## 2020-07-10 DIAGNOSIS — E039 Hypothyroidism, unspecified: Secondary | ICD-10-CM | POA: Insufficient documentation

## 2020-07-10 DIAGNOSIS — F17210 Nicotine dependence, cigarettes, uncomplicated: Secondary | ICD-10-CM | POA: Diagnosis not present

## 2020-07-10 DIAGNOSIS — Z7951 Long term (current) use of inhaled steroids: Secondary | ICD-10-CM | POA: Insufficient documentation

## 2020-07-10 DIAGNOSIS — I160 Hypertensive urgency: Secondary | ICD-10-CM | POA: Insufficient documentation

## 2020-07-10 LAB — CBG MONITORING, ED: Glucose-Capillary: 156 mg/dL — ABNORMAL HIGH (ref 70–99)

## 2020-07-10 LAB — CBC WITH DIFFERENTIAL/PLATELET
Abs Immature Granulocytes: 0.01 10*3/uL (ref 0.00–0.07)
Basophils Absolute: 0.1 10*3/uL (ref 0.0–0.1)
Basophils Relative: 1 %
Eosinophils Absolute: 0.4 10*3/uL (ref 0.0–0.5)
Eosinophils Relative: 5 %
HCT: 41 % (ref 36.0–46.0)
Hemoglobin: 12.8 g/dL (ref 12.0–15.0)
Immature Granulocytes: 0 %
Lymphocytes Relative: 37 %
Lymphs Abs: 2.7 10*3/uL (ref 0.7–4.0)
MCH: 27.2 pg (ref 26.0–34.0)
MCHC: 31.2 g/dL (ref 30.0–36.0)
MCV: 87 fL (ref 80.0–100.0)
Monocytes Absolute: 0.5 10*3/uL (ref 0.1–1.0)
Monocytes Relative: 7 %
Neutro Abs: 3.7 10*3/uL (ref 1.7–7.7)
Neutrophils Relative %: 50 %
Platelets: 196 10*3/uL (ref 150–400)
RBC: 4.71 MIL/uL (ref 3.87–5.11)
RDW: 16.4 % — ABNORMAL HIGH (ref 11.5–15.5)
WBC: 7.4 10*3/uL (ref 4.0–10.5)
nRBC: 0 % (ref 0.0–0.2)

## 2020-07-10 LAB — BASIC METABOLIC PANEL
Anion gap: 9 (ref 5–15)
BUN: 14 mg/dL (ref 8–23)
CO2: 23 mmol/L (ref 22–32)
Calcium: 8.9 mg/dL (ref 8.9–10.3)
Chloride: 104 mmol/L (ref 98–111)
Creatinine, Ser: 0.78 mg/dL (ref 0.44–1.00)
GFR, Estimated: >60 mL/min (ref >60)
Glucose, Bld: 171 mg/dL — ABNORMAL HIGH (ref 70–99)
Potassium: 5.3 mmol/L — ABNORMAL HIGH (ref 3.5–5.1)
Sodium: 136 mmol/L (ref 135–145)

## 2020-07-10 MED ORDER — LOSARTAN POTASSIUM-HCTZ 100-25 MG PO TABS
1.0000 | ORAL_TABLET | Freq: Every day | ORAL | 0 refills | Status: DC
Start: 2020-07-10 — End: 2020-07-10

## 2020-07-10 MED ORDER — HYDRALAZINE HCL 20 MG/ML IJ SOLN
5.0000 mg | Freq: Once | INTRAMUSCULAR | Status: AC
  Administered 2020-07-10: 5 mg via INTRAVENOUS
  Filled 2020-07-10: qty 1

## 2020-07-10 NOTE — ED Triage Notes (Signed)
Pt BIB EMS for c/o HA and nausea. Pt started feeling these symptoms while driving. Pt hypertensive with EMS and on arrival. Hx of HTN; pt reports running out of meds yesterday. Pt is alert and oriented, however is slow to respond and easily distracted.

## 2020-07-10 NOTE — ED Notes (Signed)
Pt constantly removing BP cuff. Informed pt of importance of keeping it on so we may measure her BP.

## 2020-07-10 NOTE — ED Notes (Signed)
Urine sample contaminated; will attempt another sample.

## 2020-07-10 NOTE — ED Notes (Signed)
Pt ambulated to the bathroom.  

## 2020-07-10 NOTE — Discharge Instructions (Addendum)
Take your blood pressure medication as prescribed.  Follow-up with your doctor for recheck.  Return to emergency room for worsening or concerning symptoms.

## 2020-07-10 NOTE — ED Provider Notes (Signed)
65 year old female, HTN, HA, out of meds x 1 month. Awaiting CT head, IV meds after. HA with blurry vision, no focal deficits.  Physical Exam  BP (!) 198/99   Pulse 62   Temp 99.2 F (37.3 C) (Oral)   Resp 11   SpO2 95%   Physical Exam Alert, well appearing, sitting on the side of the bed asking to go home. Reports headache resolved.  ED Course/Procedures   Clinical Course as of 07/10/20 1458  Sun Jul 10, 2020  1418 BP(!): 227/92 [HK]  1456 BP(!): 198/99 [HK]    Clinical Course User Index [HK] Delia Heady, PA-C    Procedures  MDM  CT head, labs reviewed. BP has improved with hydralazine. Patient reports HA resolved and would like to go home. Requests rx for her bp meds be sent to her pharmacy. Advised to follow up with PCP, return for worsening or concerning symptoms.       Tacy Learn, PA-C 07/10/20 [place], [place], DO 07/10/20 [phone]

## 2020-07-10 NOTE — ED Notes (Signed)
Patient transported to CT 

## 2020-07-10 NOTE — ED Notes (Signed)
Pt returned from CT °

## 2020-07-10 NOTE — ED Provider Notes (Signed)
[facility] DEPARTMENT Provider Note   CSN: [id number] Arrival date & time: 07/10/20  1349     History Chief Complaint  Patient presents with  . Headache  . Nausea    Wendy Carr is a 65 y.o. female with a past medical history of hypertension, anxiety, substance abuse presenting to the ED with a chief complaint of headache. Reports gradual onset of generalized headache about 14 hours ago in the middle of the night. States that she has been out of her antihypertensive medication "for probably a month." She has not take any medicine to help with her headache. Reports some blurry vision that started last night as well. Denies any chest pain, shortness of breath, injuries or falls. Remainder of history is limited as patient is slow to respond and easily distracted.  HPI     Past Medical History:  Diagnosis Date  . ALLERGIC RHINITIS   . Allergy   . ANEMIA-NOS   . ANXIETY   . Arthritis   . ASTHMA   . Blood transfusion without reported diagnosis   . GERD   . HYPERGLYCEMIA, BORDERLINE   . HYPERTENSION   . HYPOTHYROIDISM   . Personal history of colonic polyps 2011   last colonscopy 2011: no polyps.    Patient Active Problem List   Diagnosis Date Noted  . Thyroid dysfunction 06/19/2017  . Routine general medical examination at a health care facility 07/07/2015  . Sleep disturbance 07/07/2015  . Arthritis 03/30/2014  . Opiate withdrawal ([place]) 12/16/2011  . Cocaine abuse ([place]) 12/16/2011  . Backache 09/03/2008  . HYPERGLYCEMIA, BORDERLINE 09/03/2008  . ANEMIA-NOS 08/20/2008  . Anxiety and depression 08/20/2008  . Essential hypertension 08/20/2008  . ALLERGIC RHINITIS 08/20/2008  . Asthma 08/20/2008  . GERD 08/20/2008    Past Surgical History:  Procedure Laterality Date  . BREAST SURGERY  2004   Lumpectomy, right side-Benign  . COLONOSCOPY    . DILATION AND CURETTAGE OF UTERUS    . FOOT SURGERY  2006   right  . [place]  . MYOMECTOMY  1997  . THYROIDECTOMY, PARTIAL  2004   Left, benign  . VEIN SURGERY  1997   left arm     OB History    Gravida  1   Para      Term      Preterm      AB  1   Living        SAB      IAB      Ectopic      Multiple      Live Births              Family History  Problem Relation Age of Onset  . Hypertension Mother   . Diabetes Sister   . Arthritis Other   . Diabetes Other   . Pancreatic cancer Maternal Uncle   . Diabetes Maternal Grandmother   . Ovarian cancer Maternal Grandmother   . Colon cancer Maternal Grandmother   . Esophageal cancer Neg Hx   . Stomach cancer Neg Hx   . Rectal cancer Neg Hx     Social History   Tobacco Use  . Smoking status: Current Every Day Smoker    Packs/day: 0.40    Years: 7.00    Pack years: 2.80    Types: Cigarettes  . Smokeless tobacco: Never Used  . Tobacco comment: Married 2009-lives with spouse, no kids  Substance Use Topics  . Alcohol use: No    Alcohol/week: 0.0 standard drinks  . Drug use: Yes    Types: Cocaine    Comment: Last time usage was December 2016 - heroin and cocaine    Home Medications Prior to Admission medications   Medication Sig Start Date End Date Taking? Authorizing Provider  acetaminophen (TYLENOL) 500 MG tablet Take 2 tablets (1,000 mg total) by mouth every 6 (six) hours as needed. Patient taking differently: Take 1,000 mg by mouth every 6 (six) hours as needed for mild pain, fever or headache.  06/05/16   Charlesetta Shanks, MD  albuterol (PROVENTIL HFA;VENTOLIN HFA) 108 (90 Base) MCG/ACT inhaler Inhale 1-2 puffs into the lungs every 6 (six) hours as needed for wheezing or shortness of breath. 06/19/17   Lance Sell, NP  amoxicillin (AMOXIL) 500 MG capsule Take 1 capsule (500 mg total) by mouth 3 (three) times daily. 10/03/18   Harris, Vernie Shanks, PA-C  BIOTIN PO Take 1 tablet by mouth daily.    [provider]  budesonide-formoterol (SYMBICORT)  80-4.5 MCG/ACT inhaler Inhale 2 puffs into the lungs 2 (two) times daily. 08/14/17   Marrian Salvage, FNP  busPIRone (BUSPAR) 10 MG tablet Take 1 tablet (10 mg total) by mouth 2 (two) times daily. 10/02/18   Binnie Rail, MD  Cholecalciferol (VITAMIN D3 PO) Take 1 tablet by mouth daily.    [provider]  citalopram (CELEXA) 20 MG tablet Take 1 tablet (20 mg total) by mouth daily. 10/02/18   Binnie Rail, MD  diclofenac sodium (VOLTAREN) 1 % GEL Apply 4 g topically 4 (four) times daily as needed. 01/20/18   Hilts, Legrand Como, MD  Fe Fum-Fe Poly-Vit C-Lactobac (FUSION PO) Take 1 tablet by mouth daily.    [provider]  guaiFENesin (ROBITUSSIN) 100 MG/5ML liquid Take 10 mLs (200 mg total) by mouth every 4 (four) hours as needed for cough. 08/10/17   Jacqlyn Larsen, PA-C  ipratropium-albuterol (DUONEB) 0.5-2.5 (3) MG/3ML SOLN Take 3 mLs by nebulization every 6 (six) hours as needed. Patient taking differently: Take 3 mLs by nebulization every 6 (six) hours as needed (shortness of breath/wheezing).  05/31/14   Le, Thao P, DO  loratadine (CLARITIN) 10 MG tablet Take 10 mg by mouth daily as needed for allergies.    [provider]  losartan-hydrochlorothiazide (HYZAAR) 100-25 MG tablet Take 1 tablet by mouth daily. 10/02/18   Burns, Claudina Lick, MD  montelukast (SINGULAIR) 10 MG tablet Take 1 tablet (10 mg total) by mouth at bedtime. Patient taking differently: Take 10 mg by mouth at bedtime as needed (seasonal allergies).  08/14/17   Marrian Salvage, FNP  Spacer/Aero-Holding Chambers (AEROCHAMBER PLUS WITH MASK) inhaler Use as instructed 06/05/16   Charlesetta Shanks, MD  traMADol (ULTRAM) 50 MG tablet Take 1 tablet (50 mg total) by mouth every 6 (six) hours as needed. 11/23/19   Hilts, Legrand Como, MD  traZODone (DESYREL) 50 MG tablet TAKE 1 TABLET (50 MG TOTAL) BY MOUTH AT BEDTIME. 11/10/18   Burns, Claudina Lick, MD  vitamin B-12 (CYANOCOBALAMIN) 500 MCG tablet Take 500 mcg by mouth  daily.    [provider]    Allergies    Naproxen  Review of Systems   Review of Systems  Constitutional: Negative for appetite change, chills and fever.  HENT: Negative for ear pain, rhinorrhea, sneezing and sore throat.   Eyes: Positive for visual disturbance. Negative for photophobia.  Respiratory: Negative for cough, chest  tightness, shortness of breath and wheezing.   Cardiovascular: Negative for chest pain and palpitations.  Gastrointestinal: Negative for abdominal pain, blood in stool, constipation, diarrhea, nausea and vomiting.  Genitourinary: Negative for dysuria, hematuria and urgency.  Musculoskeletal: Negative for myalgias.  Skin: Negative for rash.  Neurological: Positive for headaches. Negative for dizziness, weakness and light-headedness.    Physical Exam Updated Vital Signs BP (!) 198/99   Pulse 62   Temp 99.2 F (37.3 C) (Oral)   Resp 11   SpO2 95%   Physical Exam Vitals and nursing note reviewed.  Constitutional:      General: She is not in acute distress.    Appearance: She is well-developed and well-nourished.  HENT:     Head: Normocephalic and atraumatic.     Nose: Nose normal.  Eyes:     General: No scleral icterus.       Left eye: No discharge.     Extraocular Movements: EOM normal.     Conjunctiva/sclera: Conjunctivae normal.  Cardiovascular:     Rate and Rhythm: Normal rate and regular rhythm.     Pulses: Intact distal pulses.     Heart sounds: Normal heart sounds. No murmur heard. No friction rub. No gallop.   Pulmonary:     Effort: Pulmonary effort is normal. No respiratory distress.     Breath sounds: Normal breath sounds.  Abdominal:     General: Bowel sounds are normal. There is no distension.     Palpations: Abdomen is soft.     Tenderness: There is no abdominal tenderness. There is no guarding.  Musculoskeletal:        General: No edema. Normal range of motion.     Cervical back: Normal range of motion and neck supple.   Skin:    General: Skin is warm and dry.     Findings: No rash.  Neurological:     Mental Status: She is alert. She is disoriented.     Cranial Nerves: No cranial nerve deficit.     Sensory: No sensory deficit.     Motor: No weakness or abnormal muscle tone.     Coordination: Coordination normal.     Comments: Alert, oriented to self. Is able to identify family member in the room. Believes the year is 2023. Knows she is in [place]. Moving all extremities without difficulty. Strength 5/5 in bilateral upper and lower extremities. Will fall asleep intermittently.  Psychiatric:        Mood and Affect: Mood and affect normal.     ED Results / Procedures / Treatments   Labs (all labs ordered are listed, but only abnormal results are displayed) Labs Reviewed  CBG MONITORING, ED - Abnormal; Notable for the following components:      Result Value   Glucose-Capillary 156 (*)    All other components within normal limits  BASIC METABOLIC PANEL  CBC WITH DIFFERENTIAL/PLATELET  URINALYSIS, ROUTINE W REFLEX MICROSCOPIC    EKG None  Radiology No results found.  Procedures Procedures   Medications Ordered in ED Medications - No data to display  ED Course  I have reviewed the triage vital signs and the nursing notes.  Pertinent labs & imaging results that were available during my care of the patient were reviewed by me and considered in my medical decision making (see chart for details).  Clinical Course as of 07/10/20 1457  Sun Jul 10, 2020  1418 BP(!): 227/92 [HK]  1456 BP(!): 198/99 [HK]    Clinical  Course User Index [HK] Delia Heady, PA-C   MDM Rules/Calculators/A&P                          65 year old female with past medical history of hypertension, anxiety, substance abuse presenting to the ED with a chief complaint of headache.  Gradual onset of generalized headaches about 14 hours ago, none overnight.  She admits that she has been out of her antihypertensive  medications for most likely a month.  She does not take any medications to help with her headache.  For associated blurry vision.  History somewhat limited as patient is slow to respond and will intermittently fall asleep.  She is moving all extremities without difficulty.  Normal sensation and strength noted in bilateral upper and lower extremities.  Patient's blood pressure elevated here to 974 systolic on arrival.  Chart review shows that she has had similar symptoms in the past due to noncompliance with her medications.  Will obtain labwork, CT of the head prior to giving medications. Care handed off to oncoming provider pending recheck and remainder of workup.  Portions of this note were generated with [employer]. Dictation errors may occur despite best attempts at proofreading.  Final Clinical Impression(s) / ED Diagnoses Final diagnoses:  Hypertensive urgency    Rx / DC Orders ED Discharge Orders    None       Delia Heady, PA-C 07/10/20 1510    Tegeler, Gwenyth Allegra, MD 07/10/20 [phone]

## 2020-07-11 MED FILL — LOSARTAN-HCTZ 100-25 MG TAB: 100-25 | 30 days supply | Qty: 30 | Fill #0

## 2020-10-01 DIAGNOSIS — K810 Acute cholecystitis: Secondary | ICD-10-CM | POA: Diagnosis not present

## 2020-10-01 DIAGNOSIS — Z111 Encounter for screening for respiratory tuberculosis: Secondary | ICD-10-CM | POA: Diagnosis not present

## 2020-10-01 DIAGNOSIS — Z113 Encounter for screening for infections with a predominantly sexual mode of transmission: Secondary | ICD-10-CM | POA: Diagnosis not present

## 2020-10-01 DIAGNOSIS — F17210 Nicotine dependence, cigarettes, uncomplicated: Secondary | ICD-10-CM | POA: Diagnosis not present

## 2020-10-01 DIAGNOSIS — F1910 Other psychoactive substance abuse, uncomplicated: Secondary | ICD-10-CM | POA: Diagnosis not present

## 2020-10-01 DIAGNOSIS — K279 Peptic ulcer, site unspecified, unspecified as acute or chronic, without hemorrhage or perforation: Secondary | ICD-10-CM | POA: Diagnosis not present

## 2020-10-01 DIAGNOSIS — I219 Acute myocardial infarction, unspecified: Secondary | ICD-10-CM | POA: Diagnosis not present

## 2020-10-01 DIAGNOSIS — F1120 Opioid dependence, uncomplicated: Secondary | ICD-10-CM | POA: Diagnosis not present

## 2020-10-01 DIAGNOSIS — F32A Depression, unspecified: Secondary | ICD-10-CM | POA: Diagnosis not present

## 2020-10-01 DIAGNOSIS — G8918 Other acute postprocedural pain: Secondary | ICD-10-CM | POA: Diagnosis not present

## 2020-10-01 DIAGNOSIS — J45909 Unspecified asthma, uncomplicated: Secondary | ICD-10-CM | POA: Diagnosis not present

## 2020-10-01 DIAGNOSIS — K5790 Diverticulosis of intestine, part unspecified, without perforation or abscess without bleeding: Secondary | ICD-10-CM | POA: Diagnosis not present

## 2020-10-01 DIAGNOSIS — E039 Hypothyroidism, unspecified: Secondary | ICD-10-CM | POA: Diagnosis not present

## 2020-10-01 DIAGNOSIS — Z1152 Encounter for screening for COVID-19: Secondary | ICD-10-CM | POA: Diagnosis not present

## 2020-10-01 DIAGNOSIS — J45901 Unspecified asthma with (acute) exacerbation: Secondary | ICD-10-CM | POA: Diagnosis not present

## 2020-10-01 DIAGNOSIS — K8000 Calculus of gallbladder with acute cholecystitis without obstruction: Secondary | ICD-10-CM | POA: Diagnosis not present

## 2020-10-01 DIAGNOSIS — F419 Anxiety disorder, unspecified: Secondary | ICD-10-CM | POA: Diagnosis not present

## 2020-10-01 DIAGNOSIS — Z1159 Encounter for screening for other viral diseases: Secondary | ICD-10-CM | POA: Diagnosis not present

## 2020-10-01 DIAGNOSIS — I10 Essential (primary) hypertension: Secondary | ICD-10-CM | POA: Diagnosis not present

## 2020-10-01 DIAGNOSIS — F1420 Cocaine dependence, uncomplicated: Secondary | ICD-10-CM | POA: Diagnosis not present

## 2020-10-01 DIAGNOSIS — K2970 Gastritis, unspecified, without bleeding: Secondary | ICD-10-CM | POA: Diagnosis not present

## 2020-10-01 DIAGNOSIS — Z886 Allergy status to analgesic agent status: Secondary | ICD-10-CM | POA: Diagnosis not present

## 2020-10-01 DIAGNOSIS — R109 Unspecified abdominal pain: Secondary | ICD-10-CM | POA: Diagnosis not present

## 2020-10-04 ENCOUNTER — Other Ambulatory Visit (HOSPITAL_COMMUNITY): Payer: Self-pay

## 2020-10-13 DIAGNOSIS — F1120 Opioid dependence, uncomplicated: Secondary | ICD-10-CM | POA: Diagnosis not present

## 2020-10-21 DIAGNOSIS — F32A Depression, unspecified: Secondary | ICD-10-CM | POA: Diagnosis not present

## 2020-10-21 DIAGNOSIS — K279 Peptic ulcer, site unspecified, unspecified as acute or chronic, without hemorrhage or perforation: Secondary | ICD-10-CM | POA: Diagnosis not present

## 2020-10-21 DIAGNOSIS — K5790 Diverticulosis of intestine, part unspecified, without perforation or abscess without bleeding: Secondary | ICD-10-CM | POA: Diagnosis not present

## 2020-10-21 DIAGNOSIS — K810 Acute cholecystitis: Secondary | ICD-10-CM | POA: Diagnosis not present

## 2020-10-21 DIAGNOSIS — I10 Essential (primary) hypertension: Secondary | ICD-10-CM | POA: Diagnosis not present

## 2020-10-21 DIAGNOSIS — R109 Unspecified abdominal pain: Secondary | ICD-10-CM | POA: Diagnosis not present

## 2020-10-21 DIAGNOSIS — G8918 Other acute postprocedural pain: Secondary | ICD-10-CM | POA: Diagnosis not present

## 2020-10-21 DIAGNOSIS — E039 Hypothyroidism, unspecified: Secondary | ICD-10-CM | POA: Diagnosis not present

## 2020-10-21 DIAGNOSIS — K8000 Calculus of gallbladder with acute cholecystitis without obstruction: Secondary | ICD-10-CM | POA: Diagnosis not present

## 2020-10-21 DIAGNOSIS — F1420 Cocaine dependence, uncomplicated: Secondary | ICD-10-CM | POA: Diagnosis not present

## 2020-10-21 DIAGNOSIS — J45909 Unspecified asthma, uncomplicated: Secondary | ICD-10-CM | POA: Diagnosis not present

## 2020-10-21 DIAGNOSIS — F17210 Nicotine dependence, cigarettes, uncomplicated: Secondary | ICD-10-CM | POA: Diagnosis not present

## 2020-10-21 DIAGNOSIS — Z886 Allergy status to analgesic agent status: Secondary | ICD-10-CM | POA: Diagnosis not present

## 2020-10-21 DIAGNOSIS — K8020 Calculus of gallbladder without cholecystitis without obstruction: Secondary | ICD-10-CM | POA: Diagnosis not present

## 2020-10-21 DIAGNOSIS — J45901 Unspecified asthma with (acute) exacerbation: Secondary | ICD-10-CM | POA: Diagnosis not present

## 2020-10-21 DIAGNOSIS — F1910 Other psychoactive substance abuse, uncomplicated: Secondary | ICD-10-CM | POA: Diagnosis not present

## 2020-10-21 DIAGNOSIS — F419 Anxiety disorder, unspecified: Secondary | ICD-10-CM | POA: Diagnosis not present

## 2020-10-27 ENCOUNTER — Other Ambulatory Visit: Payer: Self-pay | Admitting: *Deleted

## 2020-10-27 DIAGNOSIS — I10 Essential (primary) hypertension: Secondary | ICD-10-CM | POA: Diagnosis not present

## 2020-10-27 DIAGNOSIS — F1920 Other psychoactive substance dependence, uncomplicated: Secondary | ICD-10-CM | POA: Diagnosis not present

## 2020-10-27 DIAGNOSIS — F1020 Alcohol dependence, uncomplicated: Secondary | ICD-10-CM | POA: Diagnosis not present

## 2020-10-27 DIAGNOSIS — F1420 Cocaine dependence, uncomplicated: Secondary | ICD-10-CM | POA: Diagnosis not present

## 2020-10-27 DIAGNOSIS — Z79899 Other long term (current) drug therapy: Secondary | ICD-10-CM | POA: Diagnosis not present

## 2020-10-27 DIAGNOSIS — F1120 Opioid dependence, uncomplicated: Secondary | ICD-10-CM | POA: Diagnosis not present

## 2020-10-27 NOTE — Patient Outreach (Signed)
[place] [facility]) Care Management  10/27/2020  Wendy Carr 1956/02/27 [id number]   Transition of care telephone call  Referral received:10/27/20 Initial outreach:10/27/20 Insurance: [facility]   Initial unsuccessful telephone call to patient's preferred number in order to complete transition of care assessment; no answer, unable to leave a message.  Objective: Per the electronic in basket message received Wendy Carr was was hospitalized at  [facility] 6/17-6/22, Dx: Acute Cholecystitis Notification date 10/24/20, no information in care everywhere or  patient ping .   Plan: This RNCM will route unsuccessful outreach letter with [place] Management pamphlet and 24 hour Nurse Advice Line Magnet to [facility] clinical pool to be mailed to patient's home address. This RNCM will attempt another outreach within 4 business days.   Joylene Draft, RN, BSN  [place] Management Coordinator  [phone]- Mobile [phone]- Toll Free Main Office

## 2020-10-28 DIAGNOSIS — F1120 Opioid dependence, uncomplicated: Secondary | ICD-10-CM | POA: Diagnosis not present

## 2020-10-28 DIAGNOSIS — F1420 Cocaine dependence, uncomplicated: Secondary | ICD-10-CM | POA: Diagnosis not present

## 2020-10-29 DIAGNOSIS — F1120 Opioid dependence, uncomplicated: Secondary | ICD-10-CM | POA: Diagnosis not present

## 2020-10-29 DIAGNOSIS — F1420 Cocaine dependence, uncomplicated: Secondary | ICD-10-CM | POA: Diagnosis not present

## 2020-10-30 DIAGNOSIS — F1120 Opioid dependence, uncomplicated: Secondary | ICD-10-CM | POA: Diagnosis not present

## 2020-10-30 DIAGNOSIS — F1420 Cocaine dependence, uncomplicated: Secondary | ICD-10-CM | POA: Diagnosis not present

## 2020-10-31 DIAGNOSIS — F1120 Opioid dependence, uncomplicated: Secondary | ICD-10-CM | POA: Diagnosis not present

## 2020-10-31 DIAGNOSIS — F1420 Cocaine dependence, uncomplicated: Secondary | ICD-10-CM | POA: Diagnosis not present

## 2020-11-01 ENCOUNTER — Other Ambulatory Visit: Payer: Self-pay | Admitting: *Deleted

## 2020-11-01 DIAGNOSIS — F1120 Opioid dependence, uncomplicated: Secondary | ICD-10-CM | POA: Diagnosis not present

## 2020-11-01 DIAGNOSIS — F1420 Cocaine dependence, uncomplicated: Secondary | ICD-10-CM | POA: Diagnosis not present

## 2020-11-01 NOTE — Patient Outreach (Signed)
[place] [facility]) Care Management  11/01/2020  Darielle Hancher 13-Nov-1955 [id number]   Transition of care call Referral received: 10/27/20 Initial outreach attempt: /10/27/20 Insurance: [place]   #2 outreach attempt  2nd unsuccessful telephone call to patient's preferred contact number in order to complete post hospital discharge transition of care assessment , no answer unable to leave a message, per Designated party release on file she does not authorize anyone to receive information on her.    Objective: Per the electronic in basket message received Deloise Marchant was was hospitalized at  [facility] 6/17-6/22, Dx: Acute Cholecystitis Notification date 10/24/20, no information in care everywhere or  patient ping .     Plan If no return call from patient will attempt 3rd outreach in the next 4 business days.   Joylene Draft, RN, BSN  [place] Management Coordinator  [phone]- Mobile [phone]- Toll Free Main Office

## 2020-11-02 DIAGNOSIS — F1420 Cocaine dependence, uncomplicated: Secondary | ICD-10-CM | POA: Diagnosis not present

## 2020-11-02 DIAGNOSIS — F1120 Opioid dependence, uncomplicated: Secondary | ICD-10-CM | POA: Diagnosis not present

## 2020-11-03 DIAGNOSIS — F1120 Opioid dependence, uncomplicated: Secondary | ICD-10-CM | POA: Diagnosis not present

## 2020-11-03 DIAGNOSIS — F1420 Cocaine dependence, uncomplicated: Secondary | ICD-10-CM | POA: Diagnosis not present

## 2020-11-04 ENCOUNTER — Other Ambulatory Visit: Payer: Self-pay | Admitting: *Deleted

## 2020-11-04 DIAGNOSIS — F1120 Opioid dependence, uncomplicated: Secondary | ICD-10-CM | POA: Diagnosis not present

## 2020-11-04 DIAGNOSIS — F1420 Cocaine dependence, uncomplicated: Secondary | ICD-10-CM | POA: Diagnosis not present

## 2020-11-04 NOTE — Patient Outreach (Signed)
[place] [facility]) Care Management  11/04/2020  Wendy Carr 07-02-1955 [id number]   Transition of care call Referral received: 10/27/20 Initial outreach attempt: 10/27/20 Insurance: [place] unsuccessful telephone call to patient's preferred contact number in order to complete post hospital discharge transition of care assessment; no answer, unable to leave a message.   Objective: Per the electronic in basket message received Wendy Carr was was hospitalized at  [facility] 6/17-6/22, Dx: Acute Cholecystitis Notification date 10/24/20, no information in care everywhere or  patient ping .      Plan: If no return call from patient, will plan return call in the next 3 weeks.     Wendy Draft, RN, BSN  [place] Management Coordinator  [phone]- Mobile [phone]- Toll Free Main Office

## 2020-11-05 DIAGNOSIS — Z1152 Encounter for screening for COVID-19: Secondary | ICD-10-CM | POA: Diagnosis not present

## 2020-11-05 DIAGNOSIS — F1120 Opioid dependence, uncomplicated: Secondary | ICD-10-CM | POA: Diagnosis not present

## 2020-11-05 DIAGNOSIS — F1420 Cocaine dependence, uncomplicated: Secondary | ICD-10-CM | POA: Diagnosis not present

## 2020-11-05 DIAGNOSIS — I10 Essential (primary) hypertension: Secondary | ICD-10-CM | POA: Diagnosis not present

## 2020-11-06 DIAGNOSIS — F1120 Opioid dependence, uncomplicated: Secondary | ICD-10-CM | POA: Diagnosis not present

## 2020-11-06 DIAGNOSIS — F1420 Cocaine dependence, uncomplicated: Secondary | ICD-10-CM | POA: Diagnosis not present

## 2020-11-07 DIAGNOSIS — F1420 Cocaine dependence, uncomplicated: Secondary | ICD-10-CM | POA: Diagnosis not present

## 2020-11-07 DIAGNOSIS — F1120 Opioid dependence, uncomplicated: Secondary | ICD-10-CM | POA: Diagnosis not present

## 2020-11-08 ENCOUNTER — Other Ambulatory Visit (HOSPITAL_COMMUNITY): Payer: Self-pay

## 2020-11-08 MED ORDER — CYCLOBENZAPRINE HCL 10 MG PO TABS
10.0000 mg | ORAL_TABLET | Freq: Three times a day (TID) | ORAL | 0 refills | Status: DC | PRN
  Filled 2020-11-08: qty 42, 14d supply, fill #0

## 2020-11-09 ENCOUNTER — Other Ambulatory Visit (HOSPITAL_COMMUNITY): Payer: Self-pay

## 2020-11-10 ENCOUNTER — Other Ambulatory Visit (HOSPITAL_COMMUNITY): Payer: Self-pay

## 2020-11-10 MED ORDER — TRAZODONE HCL 50 MG PO TABS
50.0000 mg | ORAL_TABLET | Freq: Every evening | ORAL | 0 refills | Status: DC | PRN
  Filled 2020-11-10: qty 14, 14d supply, fill #0

## 2020-11-10 MED ORDER — CITALOPRAM HYDROBROMIDE 30 MG PO CAPS
1.0000 | ORAL_CAPSULE | Freq: Every day | ORAL | 0 refills | Status: DC
  Filled 2020-11-10: qty 14, 14d supply, fill #0

## 2020-11-11 ENCOUNTER — Other Ambulatory Visit (HOSPITAL_COMMUNITY): Payer: Self-pay

## 2020-11-12 ENCOUNTER — Encounter (HOSPITAL_COMMUNITY): Payer: Self-pay | Admitting: *Deleted

## 2020-11-12 ENCOUNTER — Ambulatory Visit (HOSPITAL_COMMUNITY)
Admission: EM | Admit: 2020-11-12 | Discharge: 2020-11-12 | Disposition: A | Discharge status: Home / Self Care | Payer: 59

## 2020-11-12 ENCOUNTER — Other Ambulatory Visit: Payer: Self-pay

## 2020-11-12 DIAGNOSIS — F1990 Other psychoactive substance use, unspecified, uncomplicated: Secondary | ICD-10-CM | POA: Diagnosis not present

## 2020-11-12 DIAGNOSIS — G894 Chronic pain syndrome: Secondary | ICD-10-CM

## 2020-11-12 DIAGNOSIS — Z760 Encounter for issue of repeat prescription: Secondary | ICD-10-CM | POA: Diagnosis not present

## 2020-11-12 DIAGNOSIS — F419 Anxiety disorder, unspecified: Secondary | ICD-10-CM

## 2020-11-12 MED ORDER — CITALOPRAM HYDROBROMIDE 20 MG PO TABS: 30.0000 mg | ORAL_TABLET | Freq: Every day | ORAL | 0 refills | Status: DC

## 2020-11-12 MED ORDER — CLONIDINE HCL 0.1 MG PO TABS: 0.1000 mg | ORAL_TABLET | Freq: Three times a day (TID) | ORAL | 11 refills | Status: DC

## 2020-11-12 MED ORDER — PROPRANOLOL HCL 10 MG PO TABS: 10.0000 mg | ORAL_TABLET | Freq: Three times a day (TID) | ORAL | 0 refills | Status: DC

## 2020-11-12 MED ORDER — TOPIRAMATE 50 MG PO TABS
50.0000 mg | ORAL_TABLET | Freq: Every day | ORAL | 0 refills | Status: DC
Start: 2020-11-12 — End: 2021-06-06

## 2020-11-12 MED ORDER — MELATONIN 5 MG PO TABS: 5.0000 mg | ORAL_TABLET | Freq: Every evening | ORAL | 0 refills | Status: DC | PRN

## 2020-11-12 MED ORDER — OMEPRAZOLE 20 MG PO CPDR
20.0000 mg | DELAYED_RELEASE_CAPSULE | Freq: Every day | ORAL | 0 refills | Status: DC
Start: 2020-11-12 — End: 2021-06-06

## 2020-11-12 MED ORDER — MELOXICAM 7.5 MG PO TABS
7.5000 mg | ORAL_TABLET | Freq: Every day | ORAL | 0 refills | Status: DC
Start: 2020-11-12 — End: 2021-01-22

## 2020-11-12 NOTE — ED Triage Notes (Signed)
PT from [place] and needs meds refilled. Pt presents with a list of meds.

## 2020-11-12 NOTE — ED Provider Notes (Signed)
[place]    CSN: [id number] Arrival date & time: 11/12/20  1242      History   Chief Complaint Chief Complaint  Patient presents with   Medication Refill    HPI Wendy Carr is a 65 y.o. female.   HPI  Medication refill: Patient reports that she is here from [facility] alcohol treatment [facility].  She reports that when she moved back here she was told that her medications would be sent for her however she has not been able to obtain them and took her last doses yesterday.  She states that she is becoming antsy and feels that her cravings are returning.  She states that she does not want to use and so she has presented today to see if we could refill her medications for her.  She reports that she needs some of her medications refilled.  She brings in a list of medications that she needs refilled.  Medications include citalopram, clonidine, ibuprofen, melatonin, methocarbamol, omeprazole, propranolol, Topamax.  She reports that she has tolerated all these medications well.  She does ask if she could have tramadol but is understanding that this is a controlled pain medication.  She asked if there is anything a bit stronger than the ibuprofen that she can take for her pain.  Past Medical History:  Diagnosis Date   ALLERGIC RHINITIS    Allergy    ANEMIA-NOS    ANXIETY    Arthritis    ASTHMA    Blood transfusion without reported diagnosis    GERD    HYPERGLYCEMIA, BORDERLINE    HYPERTENSION    HYPOTHYROIDISM    Personal history of colonic polyps 2011   last colonscopy 2011: no polyps.    Patient Active Problem List   Diagnosis Date Noted   Thyroid dysfunction 06/19/2017   Routine general medical examination at a health care facility 07/07/2015   Sleep disturbance 07/07/2015   Arthritis 03/30/2014   Opiate withdrawal ([place]) 12/16/2011   Cocaine abuse ([place]) 12/16/2011   Backache 09/03/2008   HYPERGLYCEMIA, BORDERLINE 09/03/2008   ANEMIA-NOS 08/20/2008   Anxiety  and depression 08/20/2008   Essential hypertension 08/20/2008   ALLERGIC RHINITIS 08/20/2008   Asthma 08/20/2008   GERD 08/20/2008    Past Surgical History:  Procedure Laterality Date   BREAST SURGERY  2004   Lumpectomy, right side-Benign   COLONOSCOPY     DILATION AND CURETTAGE OF UTERUS     FOOT SURGERY  2006   right   [place] ?[place]   THYROIDECTOMY, PARTIAL  2004   Left, benign   VEIN SURGERY  1997   left arm    OB History     Gravida  1   Para      Term      Preterm      AB  1   Living         SAB      IAB      Ectopic      Multiple      Live Births               Home Medications    Prior to Admission medications   Medication Sig Start Date End Date Taking? Authorizing Provider  amlodipine-atorvastatin (CADUET) 10-10 MG tablet Take 1 tablet by mouth daily. PT is unsure of dose.   Yes [provider]  acetaminophen (TYLENOL) 500 MG tablet Take 2  tablets (1,000 mg total) by mouth every 6 (six) hours as needed. Patient taking differently: Take 1,000 mg by mouth every 6 (six) hours as needed for mild pain, fever or headache. 06/05/16   Charlesetta Shanks, MD  albuterol (PROVENTIL HFA;VENTOLIN HFA) 108 (90 Base) MCG/ACT inhaler Inhale 1-2 puffs into the lungs every 6 (six) hours as needed for wheezing or shortness of breath. 06/19/17   Lance Sell, NP  albuterol (PROVENTIL) (2.5 MG/3ML) 0.083% nebulizer solution TAKE 3 ML (2.5 MG TOTAL) BY NEBULIZATION EVERY 6 (SIX) HOURS IF NEEDED FOR WHEEZING. 03/04/20 03/04/21  Herbert Spires, NP  albuterol (VENTOLIN HFA) 108 (90 Base) MCG/ACT inhaler INHALE 2 PUFFS EVERY 4 (FOUR) HOURS IF NEEDED FOR WHEEZING. 03/04/20 03/04/21  Herbert Spires, NP  amoxicillin (AMOXIL) 500 MG capsule Take 1 capsule (500 mg total) by mouth 3 (three) times daily. 10/03/18   Harris, Abigail, PA-C  benzonatate (TESSALON) 100 MG capsule TAKE 2 CAPSULES BY MOUTH THREE TIMES  DAILY IF NEEDED FOR COUGH. DO NOT CRUSH OR CHEW. 03/04/20 03/04/21  Herbert Spires, NP  BIOTIN PO Take 1 tablet by mouth daily.    [provider]  budesonide-formoterol (SYMBICORT) 80-4.5 MCG/ACT inhaler Inhale 2 puffs into the lungs 2 (two) times daily. 08/14/17   Marrian Salvage, FNP  busPIRone (BUSPAR) 10 MG tablet Take 1 tablet (10 mg total) by mouth 2 (two) times daily. 10/02/18   Binnie Rail, MD  Cholecalciferol (VITAMIN D3 PO) Take 1 tablet by mouth daily.    [provider]  citalopram (CELEXA) 20 MG tablet Take 1 tablet (20 mg total) by mouth daily. 10/02/18   Binnie Rail, MD  Citalopram Hydrobromide 30 MG CAPS Take 1 capsule by mouth daily. 11/10/20     cyclobenzaprine (FLEXERIL) 10 MG tablet Take 1 tablet (10 mg total) by mouth every 8 (eight) hours as needed. 11/08/20     diclofenac sodium (VOLTAREN) 1 % GEL Apply 4 g topically 4 (four) times daily as needed. 01/20/18   Hilts, Legrand Como, MD  Fe Fum-Fe Poly-Vit C-Lactobac (FUSION PO) Take 1 tablet by mouth daily.    [provider]  guaiFENesin (ROBITUSSIN) 100 MG/5ML liquid Take 10 mLs (200 mg total) by mouth every 4 (four) hours as needed for cough. 08/10/17   Jacqlyn Larsen, PA-C  ipratropium-albuterol (DUONEB) 0.5-2.5 (3) MG/3ML SOLN Take 3 mLs by nebulization every 6 (six) hours as needed. Patient taking differently: Take 3 mLs by nebulization every 6 (six) hours as needed (shortness of breath/wheezing). 05/31/14   Le, Thao P, DO  loratadine (CLARITIN) 10 MG tablet Take 10 mg by mouth daily as needed for allergies.    [provider]  losartan-hydrochlorothiazide (HYZAAR) 100-25 MG tablet TAKE 1 TABLET BY MOUTH DAILY. 07/10/20 07/10/21  Tacy Learn, PA-C  losartan-hydrochlorothiazide (HYZAAR) 100-25 MG tablet TAKE 1 TABLET BY MOUTH 1 (ONE) TIME EACH DAY. 03/04/20 03/04/21  Herbert Spires, NP  montelukast (SINGULAIR) 10 MG tablet Take 1 tablet (10 mg total) by mouth at bedtime. Patient  taking differently: Take 10 mg by mouth at bedtime as needed (seasonal allergies). 08/14/17   Marrian Salvage, FNP  Spacer/Aero-Holding Chambers (AEROCHAMBER PLUS WITH MASK) inhaler Use as instructed 06/05/16   Charlesetta Shanks, MD  traMADol (ULTRAM) 50 MG tablet Take 1 tablet (50 mg total) by mouth every 6 (six) hours as needed. 11/23/19   Hilts, Legrand Como, MD  traZODone (DESYREL) 50 MG tablet TAKE 1 TABLET (50 MG TOTAL) BY MOUTH  AT BEDTIME. 11/10/18   Binnie Rail, MD  traZODone (DESYREL) 50 MG tablet Take 1 tablet (50 mg total) by mouth at bedtime as needed for insomnia 11/10/20     trimethoprim-polymyxin b (POLYTRIM) ophthalmic solution ADMINISTER 1 DROP INTO THE LEFT EYE EVERY 4 HOURS FOR 7 DAYS. 03/04/20 03/04/21  Herbert Spires, NP  vitamin B-12 (CYANOCOBALAMIN) 500 MCG tablet Take 500 mcg by mouth daily.    [provider]    Family History Family History  Problem Relation Age of Onset   Hypertension Mother    Diabetes Sister    Arthritis Other    Diabetes Other    Pancreatic cancer Maternal Uncle    Diabetes Maternal Grandmother    Ovarian cancer Maternal Grandmother    Colon cancer Maternal Grandmother    Esophageal cancer Neg Hx    Stomach cancer Neg Hx    Rectal cancer Neg Hx     Social History Social History   Tobacco Use   Smoking status: Every Day    Packs/day: 0.40    Years: 7.00    Pack years: 2.80    Types: Cigarettes   Smokeless tobacco: Never   Tobacco comments:    Married 2009-lives with spouse, no kids  Substance Use Topics   Alcohol use: No    Alcohol/week: 0.0 standard drinks   Drug use: Yes    Types: Cocaine    Comment: Last time usage was December 2016 - heroin and cocaine     Allergies   Naproxen   Review of Systems Review of Systems  As stated above in HPI Physical Exam Triage Vital Signs ED Triage Vitals  Enc Vitals Group     BP 11/12/20 1311 (!) 112/54     Pulse Rate 11/12/20 1311 71     Resp 11/12/20 1311 18      Temp 11/12/20 1311 98.7 F (37.1 C)     Temp src --      SpO2 11/12/20 1311 100 %     Weight --      Height --      Head Circumference --      Peak Flow --      Pain Score 11/12/20 1312 7     Pain Loc --      Pain Edu? --      Excl. in [place]? --    No data found.  Updated Vital Signs BP (!) 112/54   Pulse 71   Temp 98.7 F (37.1 C)   Resp 18   SpO2 100%   Physical Exam Vitals and nursing note reviewed.  Constitutional:      General: She is not in acute distress.    Appearance: Normal appearance. She is not ill-appearing, toxic-appearing or diaphoretic.  Cardiovascular:     Rate and Rhythm: Normal rate and regular rhythm.     Heart sounds: Normal heart sounds.  Pulmonary:     Effort: Pulmonary effort is normal.     Breath sounds: Normal breath sounds.  Neurological:     Mental Status: She is alert and oriented to person, place, and time.  Psychiatric:        Mood and Affect: Mood normal.        Behavior: Behavior normal.        Thought Content: Thought content normal.        Judgment: Judgment normal.     UC Treatments / Results  Labs (all labs ordered are listed, but only abnormal  results are displayed) Labs Reviewed - No data to display  EKG   Radiology No results found.  Procedures Procedures (including critical care time)  Medications Ordered in UC Medications - No data to display  Initial Impression / Assessment and Plan / UC Course  I have reviewed the triage vital signs and the nursing notes.  Pertinent labs & imaging results that were available during my care of the patient were reviewed by me and considered in my medical decision making (see chart for details).     New to me.  I understand patient's concern for running out of these medications as many of them should not be suddenly stopped.  Although there is some risk in refilling these prescriptions for her I am more than happy to provide her with resources to keep her sober.  I am happy to  provide her with a bridge 30-day prescription.  She has an upcoming appointment with a new PCP which she will keep.  She is aware that if she needs our office in the meantime she can certainly return.  We also discussed the risks and benefits of the medications that she is taking.  We discussed the risk for GI bleed with the ibuprofen and the citalopram.  She asked for stronger medication for pain.  I am actually going to switch her from ibuprofen to meloxicam as this is a bit safer longer-term in terms of reducing GI bleed compared to ibuprofen and may offer her more pain reduction.  We did discuss how to take this medication along with common potential side effects and precautions.  In addition we discussed some of the other interactions of her medications.  She will need to see this new primary care and to continue her care with her psychiatrist. Final Clinical Impressions(s) / UC Diagnoses   Final diagnoses:  None   Discharge Instructions   None    ED Prescriptions   None    PDMP not reviewed this encounter.   Hughie Closs, [place] 11/12/20 1353

## 2020-11-15 ENCOUNTER — Other Ambulatory Visit (HOSPITAL_COMMUNITY): Payer: Self-pay

## 2020-11-23 ENCOUNTER — Other Ambulatory Visit: Payer: Self-pay | Admitting: *Deleted

## 2020-11-23 NOTE — Patient Outreach (Signed)
[facility]) Care Management  11/23/2020  Sheika Coutts 03-08-1956 [id number]   Transition of care call/Case Closure  Referral received: 10/27/20 Initial outreach attempt: 10/27/20 Insurance: [place] [employer]   #4 Outreach Attempt  Subjective: Unsuccessful outreach call, no answer able to leave a HIPAA compliant voicemail message for return call.    Objective: Per the electronic in basket message received Skyllar Notarianni was was hospitalized at  [facility] 6/17-6/22, Dx: Acute Cholecystitis Notification date 10/24/20, no information in care everywhere or  patient ping .   Plan: Will close case to [facility] care management, no return  response after 4 call attempts.    Joylene Draft, RN, BSN  [place] Management Coordinator  [phone]- Mobile [phone]- Toll Free Main Office

## 2020-11-24 ENCOUNTER — Other Ambulatory Visit (HOSPITAL_COMMUNITY): Payer: Self-pay

## 2020-11-24 ENCOUNTER — Ambulatory Visit: Payer: Self-pay | Admitting: *Deleted

## 2020-12-08 ENCOUNTER — Other Ambulatory Visit (HOSPITAL_COMMUNITY): Payer: Self-pay

## 2020-12-09 ENCOUNTER — Other Ambulatory Visit (HOSPITAL_COMMUNITY): Payer: Self-pay

## 2020-12-12 ENCOUNTER — Other Ambulatory Visit (HOSPITAL_COMMUNITY): Payer: Self-pay

## 2020-12-13 ENCOUNTER — Other Ambulatory Visit (HOSPITAL_COMMUNITY): Payer: Self-pay

## 2021-01-03 DIAGNOSIS — Z760 Encounter for issue of repeat prescription: Secondary | ICD-10-CM | POA: Diagnosis not present

## 2021-01-04 ENCOUNTER — Emergency Department (HOSPITAL_COMMUNITY): Payer: 59

## 2021-01-04 ENCOUNTER — Emergency Department (HOSPITAL_COMMUNITY)
Admission: EM | Admit: 2021-01-04 | Discharge: 2021-01-04 | Disposition: A | Discharge status: Home / Self Care | Payer: 59 | Attending: Emergency Medicine | Admitting: Emergency Medicine

## 2021-01-04 ENCOUNTER — Other Ambulatory Visit: Payer: Self-pay

## 2021-01-04 ENCOUNTER — Encounter (HOSPITAL_COMMUNITY): Payer: Self-pay

## 2021-01-04 DIAGNOSIS — Z79899 Other long term (current) drug therapy: Secondary | ICD-10-CM | POA: Insufficient documentation

## 2021-01-04 DIAGNOSIS — S39012A Strain of muscle, fascia and tendon of lower back, initial encounter: Secondary | ICD-10-CM | POA: Diagnosis not present

## 2021-01-04 DIAGNOSIS — E039 Hypothyroidism, unspecified: Secondary | ICD-10-CM | POA: Insufficient documentation

## 2021-01-04 DIAGNOSIS — F17210 Nicotine dependence, cigarettes, uncomplicated: Secondary | ICD-10-CM | POA: Diagnosis not present

## 2021-01-04 DIAGNOSIS — I10 Essential (primary) hypertension: Secondary | ICD-10-CM | POA: Insufficient documentation

## 2021-01-04 DIAGNOSIS — M47816 Spondylosis without myelopathy or radiculopathy, lumbar region: Secondary | ICD-10-CM | POA: Diagnosis not present

## 2021-01-04 DIAGNOSIS — Y92410 Unspecified street and highway as the place of occurrence of the external cause: Secondary | ICD-10-CM | POA: Insufficient documentation

## 2021-01-04 DIAGNOSIS — I700 Atherosclerosis of aorta: Secondary | ICD-10-CM | POA: Diagnosis not present

## 2021-01-04 DIAGNOSIS — J45909 Unspecified asthma, uncomplicated: Secondary | ICD-10-CM | POA: Diagnosis not present

## 2021-01-04 DIAGNOSIS — S3992XA Unspecified injury of lower back, initial encounter: Secondary | ICD-10-CM | POA: Diagnosis present

## 2021-01-04 DIAGNOSIS — M4699 Unspecified inflammatory spondylopathy, multiple sites in spine: Secondary | ICD-10-CM | POA: Diagnosis not present

## 2021-01-04 MED ORDER — METHOCARBAMOL 500 MG PO TABS
500.0000 mg | ORAL_TABLET | Freq: Two times a day (BID) | ORAL | 0 refills | Status: DC | PRN
Start: 2021-01-04 — End: 2021-06-06
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
## 2021-01-04 NOTE — ED Notes (Signed)
Pt states she did not take her BP meds this morning
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
## 2021-01-04 NOTE — Discharge Instructions (Addendum)
Your x-rays are normal, you may use ibuprofen as needed for pain, if this causes stomach upset then use Tylenol instead.  Robaxin has been prescribed for muscle spasm, you may use ice packs or heat to help with the pain, this should get better over the week

## 2021-01-22 ENCOUNTER — Encounter (HOSPITAL_COMMUNITY): Payer: Self-pay

## 2021-01-22 ENCOUNTER — Other Ambulatory Visit: Payer: Self-pay

## 2021-01-22 ENCOUNTER — Emergency Department (HOSPITAL_COMMUNITY)
Admission: EM | Admit: 2021-01-22 | Discharge: 2021-01-22 | Disposition: A | Discharge status: Home / Self Care | Payer: Self-pay | Attending: Student | Admitting: Student

## 2021-01-22 DIAGNOSIS — J45909 Unspecified asthma, uncomplicated: Secondary | ICD-10-CM | POA: Insufficient documentation

## 2021-01-22 DIAGNOSIS — M79671 Pain in right foot: Secondary | ICD-10-CM | POA: Insufficient documentation

## 2021-01-22 DIAGNOSIS — Z7951 Long term (current) use of inhaled steroids: Secondary | ICD-10-CM | POA: Insufficient documentation

## 2021-01-22 DIAGNOSIS — Z79899 Other long term (current) drug therapy: Secondary | ICD-10-CM | POA: Insufficient documentation

## 2021-01-22 DIAGNOSIS — E039 Hypothyroidism, unspecified: Secondary | ICD-10-CM | POA: Insufficient documentation

## 2021-01-22 DIAGNOSIS — F17210 Nicotine dependence, cigarettes, uncomplicated: Secondary | ICD-10-CM | POA: Insufficient documentation

## 2021-01-22 DIAGNOSIS — I10 Essential (primary) hypertension: Secondary | ICD-10-CM | POA: Insufficient documentation

## 2021-01-22 DIAGNOSIS — M79672 Pain in left foot: Secondary | ICD-10-CM | POA: Insufficient documentation

## 2021-01-22 MED ORDER — DOXYCYCLINE HYCLATE 100 MG PO CAPS: 100.0000 mg | ORAL_CAPSULE | Freq: Two times a day (BID) | ORAL | 0 refills | Status: AC

## 2021-01-22 MED ORDER — MELOXICAM 7.5 MG PO TABS
7.5000 mg | ORAL_TABLET | Freq: Every day | ORAL | 0 refills | Status: DC
Start: 2021-01-22 — End: 2021-06-06

## 2021-01-22 NOTE — Discharge Instructions (Signed)
Take meloxicam daily with meals.  Do not take other anti-inflammatories at the same time (Advil, Motrin, naproxen, Aleve). You may supplement with Tylenol if you need further pain control. Use the antibiotics to the pain. Use ice to with pain and swelling. Call the foot doctor listed below to set up a follow-up appointment. Follow-up with your primary care doctor for recheck of your feet and as needed for further medication refill. Return to the emergency room with any new, worsening, concerning symptoms

## 2021-01-22 NOTE — ED Provider Notes (Signed)
[facility] EMERGENCY DEPARTMENT Provider Note   CSN: [id number] Arrival date & time: 01/22/21  [id number]     History No chief complaint on file.   Wendy Carr is a 65 y.o. female presenting for evaluation of bilateral foot pain.    Pain has been going on for several months.  Worse in the past couple days.  It is worse on the right side.  She has tried Tylenol and ibuprofen without improvement of symptoms.  She tried Voltaren gel.  She denies fevers.  No drainage from the area.  No injury.  Pain does not radiate.  Walking makes it worse, nothing makes it better.  She has never seen a foot doctor for this before. She tried scraping the calluses a few days ago, this made her sxs worse.   HPI     Past Medical History:  Diagnosis Date   ALLERGIC RHINITIS    Allergy    ANEMIA-NOS    ANXIETY    Arthritis    ASTHMA    Blood transfusion without reported diagnosis    GERD    HYPERGLYCEMIA, BORDERLINE    HYPERTENSION    HYPOTHYROIDISM    Personal history of colonic polyps 2011   last colonscopy 2011: no polyps.    Patient Active Problem List   Diagnosis Date Noted   Thyroid dysfunction 06/19/2017   Routine general medical examination at a health care facility 07/07/2015   Sleep disturbance 07/07/2015   Arthritis 03/30/2014   Opiate withdrawal ([place]) 12/16/2011   Cocaine abuse ([place]) 12/16/2011   Backache 09/03/2008   HYPERGLYCEMIA, BORDERLINE 09/03/2008   ANEMIA-NOS 08/20/2008   Anxiety and depression 08/20/2008   Essential hypertension 08/20/2008   ALLERGIC RHINITIS 08/20/2008   Asthma 08/20/2008   GERD 08/20/2008    Past Surgical History:  Procedure Laterality Date   BREAST SURGERY  2004   Lumpectomy, right side-Benign   COLONOSCOPY     DILATION AND CURETTAGE OF UTERUS     FOOT SURGERY  2006   right   [place] ?[place]   THYROIDECTOMY, PARTIAL  2004   Left, benign   VEIN SURGERY  1997   left arm      OB History     Gravida  1   Para      Term      Preterm      AB  1   Living         SAB      IAB      Ectopic      Multiple      Live Births              Family History  Problem Relation Age of Onset   Hypertension Mother    Diabetes Sister    Arthritis Other    Diabetes Other    Pancreatic cancer Maternal Uncle    Diabetes Maternal Grandmother    Ovarian cancer Maternal Grandmother    Colon cancer Maternal Grandmother    Esophageal cancer Neg Hx    Stomach cancer Neg Hx    Rectal cancer Neg Hx     Social History   Tobacco Use   Smoking status: Every Day    Packs/day: 0.40    Years: 7.00    Pack years: 2.80    Types: Cigarettes   Smokeless tobacco: Never   Tobacco comments:    Married 2009-lives with spouse, no kids  Substance Use Topics   Alcohol use: No    Alcohol/week: 0.0 standard drinks   Drug use: Yes    Types: Cocaine    Comment: Last time usage was December 2016 - heroin and cocaine    Home Medications Prior to Admission medications   Medication Sig Start Date End Date Taking? Authorizing Provider  doxycycline (VIBRAMYCIN) 100 MG capsule Take 1 capsule (100 mg total) by mouth 2 (two) times daily for 7 days. 01/22/21 01/29/21 Yes Helen Winterhalter, PA-C  meloxicam (MOBIC) 7.5 MG tablet Take 1 tablet (7.5 mg total) by mouth daily. 01/22/21  Yes Maresa Morash, PA-C  acetaminophen (TYLENOL) 500 MG tablet Take 2 tablets (1,000 mg total) by mouth every 6 (six) hours as needed. Patient taking differently: Take 1,000 mg by mouth every 6 (six) hours as needed for mild pain, fever or headache. 06/05/16   Charlesetta Shanks, MD  albuterol (PROVENTIL HFA;VENTOLIN HFA) 108 (90 Base) MCG/ACT inhaler Inhale 1-2 puffs into the lungs every 6 (six) hours as needed for wheezing or shortness of breath. 06/19/17   Lance Sell, NP  albuterol (PROVENTIL) (2.5 MG/3ML) 0.083% nebulizer solution TAKE 3 ML (2.5 MG TOTAL) BY NEBULIZATION EVERY 6 (SIX)  HOURS IF NEEDED FOR WHEEZING. 03/04/20 03/04/21  Herbert Spires, NP  albuterol (VENTOLIN HFA) 108 (90 Base) MCG/ACT inhaler INHALE 2 PUFFS EVERY 4 (FOUR) HOURS IF NEEDED FOR WHEEZING. 03/04/20 03/04/21  Herbert Spires, NP  amlodipine-atorvastatin (CADUET) 10-10 MG tablet Take 1 tablet by mouth daily. PT is unsure of dose.    [provider]  BIOTIN PO Take 1 tablet by mouth daily.    [provider]  budesonide-formoterol (SYMBICORT) 80-4.5 MCG/ACT inhaler Inhale 2 puffs into the lungs 2 (two) times daily. 08/14/17   Marrian Salvage, FNP  citalopram (CELEXA) 20 MG tablet Take 1.5 tablets (30 mg total) by mouth daily. 11/12/20 12/12/20  Hughie Closs, PA-C  cloNIDine (CATAPRES) 0.1 MG tablet Take 1 tablet (0.1 mg total) by mouth 3 (three) times daily. 11/12/20   Hughie Closs, PA-C  diclofenac sodium (VOLTAREN) 1 % GEL Apply 4 g topically 4 (four) times daily as needed. 01/20/18   Hilts, Legrand Como, MD  Fe Fum-Fe Poly-Vit C-Lactobac (FUSION PO) Take 1 tablet by mouth daily.    [provider]  guaiFENesin (ROBITUSSIN) 100 MG/5ML liquid Take 10 mLs (200 mg total) by mouth every 4 (four) hours as needed for cough. 08/10/17   Jacqlyn Larsen, PA-C  ipratropium-albuterol (DUONEB) 0.5-2.5 (3) MG/3ML SOLN Take 3 mLs by nebulization every 6 (six) hours as needed. Patient taking differently: Take 3 mLs by nebulization every 6 (six) hours as needed (shortness of breath/wheezing). 05/31/14   Le, Thao P, DO  loratadine (CLARITIN) 10 MG tablet Take 10 mg by mouth daily as needed for allergies.    [provider]  losartan-hydrochlorothiazide (HYZAAR) 100-25 MG tablet TAKE 1 TABLET BY MOUTH DAILY. 07/10/20 07/10/21  Tacy Learn, PA-C  losartan-hydrochlorothiazide (HYZAAR) 100-25 MG tablet TAKE 1 TABLET BY MOUTH 1 (ONE) TIME EACH DAY. 03/04/20 03/04/21  Herbert Spires, NP  melatonin 5 MG TABS Take 1 tablet (5 mg total) by mouth at bedtime as needed. 11/12/20    Hughie Closs, PA-C  methocarbamol (ROBAXIN) 500 MG tablet Take 1 tablet (500 mg total) by mouth 2 (two) times daily as needed for muscle spasms. 01/04/21   Noemi Chapel, MD  montelukast (SINGULAIR) 10 MG tablet Take 1 tablet (10 mg total) by mouth  at bedtime. Patient taking differently: Take 10 mg by mouth at bedtime as needed (seasonal allergies). 08/14/17   Marrian Salvage, FNP  omeprazole (PRILOSEC) 20 MG capsule Take 1 capsule (20 mg total) by mouth daily. 11/12/20   Hughie Closs, PA-C  propranolol (INDERAL) 10 MG tablet Take 1 tablet (10 mg total) by mouth every 8 (eight) hours. 11/12/20 12/12/20  Hughie Closs, PA-C  Spacer/Aero-Holding Chambers (AEROCHAMBER PLUS WITH MASK) inhaler Use as instructed 06/05/16   Charlesetta Shanks, MD  topiramate (TOPAMAX) 50 MG tablet Take 1 tablet (50 mg total) by mouth daily. 11/12/20   Hughie Closs, PA-C  traZODone (DESYREL) 50 MG tablet Take 1 tablet (50 mg total) by mouth at bedtime as needed for insomnia 11/10/20     vitamin B-12 (CYANOCOBALAMIN) 500 MCG tablet Take 500 mcg by mouth daily.    [provider]    Allergies    Naproxen  Review of Systems   Review of Systems  Musculoskeletal:        Bilateral foot pain R>L  Neurological:  Negative for numbness.   Physical Exam Updated Vital Signs BP (!) 161/85 (BP Location: Right Arm)   Pulse 89   Temp 98.1 F (36.7 C) (Oral)   Resp 16   SpO2 100%   Physical Exam Vitals and nursing note reviewed.  Constitutional:      General: She is not in acute distress.    Appearance: She is well-developed.  HENT:     Head: Normocephalic and atraumatic.  Eyes:     Extraocular Movements: Extraocular movements intact.  Cardiovascular:     Rate and Rhythm: Normal rate and regular rhythm.     Pulses: Normal pulses.  Pulmonary:     Effort: Pulmonary effort is normal.     Breath sounds: Normal breath sounds.  Abdominal:     General: There is no distension.  Musculoskeletal:         General: Normal range of motion.     Cervical back: Normal range of motion.       Feet:  Feet:     Comments: 3 callus with tenderness.  No fluctuance.  No induration.  No erythema or warmth.  No active drainage. Skin:    General: Skin is warm.     Findings: No rash.  Neurological:     Mental Status: She is alert and oriented to person, place, and time.    ED Results / Procedures / Treatments   Labs (all labs ordered are listed, but only abnormal results are displayed) Labs Reviewed - No data to display  EKG None  Radiology No results found.  Procedures Procedures   Medications Ordered in ED Medications - No data to display  ED Course  I have reviewed the triage vital signs and the nursing notes.  Pertinent labs & imaging results that were available during my care of the patient were reviewed by me and considered in my medical decision making (see chart for details).    MDM Rules/Calculators/A&P                           Patient presented for evaluation of bilateral foot pain.  On exam, patient appears nontoxic.  While there is no clear signs of infection, patient does have increasing pain in areas that were recently scraped, as such, will cover with antibiotics.  However most likely inflammatory process.  Will treat with anti-inflammatories.  Discussed follow-up with  podiatry.  Without trauma or injury, doubt fracture dislocation, I do not believe emergent x-rays to be beneficial.  At this time, patient appears safe for discharge. return precautions given.  Patient states she understands and agrees to plan  Final Clinical Impression(s) / ED Diagnoses Final diagnoses:  Bilateral foot pain    Rx / DC Orders ED Discharge Orders          Ordered    meloxicam (MOBIC) 7.5 MG tablet  Daily        01/22/21 0818    doxycycline (VIBRAMYCIN) 100 MG capsule  2 times daily        01/22/21 0818             Jw Covin, PA-C 01/22/21 0825    Varney Biles, MD 01/22/21 [id number]

## 2021-01-22 NOTE — ED Triage Notes (Signed)
Patient complains of callus to bilateral feet for some time. Has tried to scrap same off with no relief. Pain with ambulation

## 2021-01-23 ENCOUNTER — Ambulatory Visit (INDEPENDENT_AMBULATORY_CARE_PROVIDER_SITE_OTHER): Payer: Self-pay | Admitting: Emergency Medicine

## 2021-01-23 ENCOUNTER — Encounter: Payer: Self-pay | Admitting: Emergency Medicine

## 2021-01-23 VITALS — BP 134/78 | HR 77 | Temp 97.8°F | Ht 62.0 in | Wt 187.0 lb

## 2021-01-23 DIAGNOSIS — Z8709 Personal history of other diseases of the respiratory system: Secondary | ICD-10-CM | POA: Insufficient documentation

## 2021-01-23 DIAGNOSIS — E785 Hyperlipidemia, unspecified: Secondary | ICD-10-CM

## 2021-01-23 DIAGNOSIS — F17200 Nicotine dependence, unspecified, uncomplicated: Secondary | ICD-10-CM | POA: Insufficient documentation

## 2021-01-23 DIAGNOSIS — Z23 Encounter for immunization: Secondary | ICD-10-CM

## 2021-01-23 DIAGNOSIS — F419 Anxiety disorder, unspecified: Secondary | ICD-10-CM

## 2021-01-23 DIAGNOSIS — F32A Depression, unspecified: Secondary | ICD-10-CM

## 2021-01-23 DIAGNOSIS — G894 Chronic pain syndrome: Secondary | ICD-10-CM

## 2021-01-23 DIAGNOSIS — Z7689 Persons encountering health services in other specified circumstances: Secondary | ICD-10-CM

## 2021-01-23 DIAGNOSIS — I10 Essential (primary) hypertension: Secondary | ICD-10-CM

## 2021-01-23 MED ORDER — LOSARTAN POTASSIUM-HCTZ 100-25 MG PO TABS
1.0000 | ORAL_TABLET | Freq: Every day | ORAL | 1 refills | Status: DC
Start: 2021-01-23 — End: 2021-07-21

## 2021-01-23 MED ORDER — CITALOPRAM HYDROBROMIDE 20 MG PO TABS: 30.0000 mg | ORAL_TABLET | Freq: Every day | ORAL | 1 refills | Status: DC

## 2021-01-23 MED ORDER — AMLODIPINE-ATORVASTATIN 10-10 MG PO TABS: 1.0000 | ORAL_TABLET | Freq: Every day | ORAL | 1 refills | Status: DC

## 2021-01-23 NOTE — Assessment & Plan Note (Signed)
Diet and nutrition discussed.  Continue statin medication.

## 2021-01-23 NOTE — Assessment & Plan Note (Addendum)
Chronic pain with a history of opiate abuse.  Needs follow-up with pain management clinic.  Patient understands I will not be her chronic pain management doctor and will not be prescribing controlled substances for her.

## 2021-01-23 NOTE — Patient Instructions (Signed)
Health Maintenance, Female Adopting a healthy lifestyle and getting preventive care are important in promoting health and wellness. Ask your health care provider about: The right schedule for you to have regular tests and exams. Things you can do on your own to prevent diseases and keep yourself healthy. What should I know about diet, weight, and exercise? Eat a healthy diet  Eat a diet that includes plenty of vegetables, fruits, low-fat dairy products, and lean protein. Do not eat a lot of foods that are high in solid fats, added sugars, or sodium. Maintain a healthy weight Body mass index (BMI) is used to identify weight problems. It estimates body fat based on height and weight. Your health care provider can help determine your BMI and help you achieve or maintain a healthy weight. Get regular exercise Get regular exercise. This is one of the most important things you can do for your health. Most adults should: Exercise for at least 150 minutes each week. The exercise should increase your heart rate and make you sweat (moderate-intensity exercise). Do strengthening exercises at least twice a week. This is in addition to the moderate-intensity exercise. Spend less time sitting. Even light physical activity can be beneficial. Watch cholesterol and blood lipids Have your blood tested for lipids and cholesterol at 63 years of age, then have this test every 5 years. Have your cholesterol levels checked more often if: Your lipid or cholesterol levels are high. You are older than 63 years of age. You are at high risk for heart disease. What should I know about cancer screening? Depending on your health history and family history, you may need to have cancer screening at various ages. This may include screening for: Breast cancer. Cervical cancer. Colorectal cancer. Skin cancer. Lung cancer. What should I know about heart disease, diabetes, and high blood pressure? Blood pressure and heart  disease High blood pressure causes heart disease and increases the risk of stroke. This is more likely to develop in people who have high blood pressure readings, are of African descent, or are overweight. Have your blood pressure checked: Every 3-5 years if you are 18-39 years of age. Every year if you are 40 years old or older. Diabetes Have regular diabetes screenings. This checks your fasting blood sugar level. Have the screening done: Once every three years after age 40 if you are at a normal weight and have a low risk for diabetes. More often and at a younger age if you are overweight or have a high risk for diabetes. What should I know about preventing infection? Hepatitis B If you have a higher risk for hepatitis B, you should be screened for this virus. Talk with your health care provider to find out if you are at risk for hepatitis B infection. Hepatitis C Testing is recommended for: Everyone born from 1945 through 1965. Anyone with known risk factors for hepatitis C. Sexually transmitted infections (STIs) Get screened for STIs, including gonorrhea and chlamydia, if: You are sexually active and are younger than 63 years of age. You are older than 63 years of age and your health care provider tells you that you are at risk for this type of infection. Your sexual activity has changed since you were last screened, and you are at increased risk for chlamydia or gonorrhea. Ask your health care provider if you are at risk. Ask your health care provider about whether you are at high risk for HIV. Your health care provider may recommend a prescription medicine   to help prevent HIV infection. If you choose to take medicine to prevent HIV, you should first get tested for HIV. You should then be tested every 3 months for as long as you are taking the medicine. Pregnancy If you are about to stop having your period (premenopausal) and you may become pregnant, seek counseling before you get  pregnant. Take 400 to 800 micrograms (mcg) of folic acid every day if you become pregnant. Ask for birth control (contraception) if you want to prevent pregnancy. Osteoporosis and menopause Osteoporosis is a disease in which the bones lose minerals and strength with aging. This can result in bone fractures. If you are 65 years old or older, or if you are at risk for osteoporosis and fractures, ask your health care provider if you should: Be screened for bone loss. Take a calcium or vitamin D supplement to lower your risk of fractures. Be given hormone replacement therapy (HRT) to treat symptoms of menopause. Follow these instructions at home: Lifestyle Do not use any products that contain nicotine or tobacco, such as cigarettes, e-cigarettes, and chewing tobacco. If you need help quitting, ask your health care provider. Do not use street drugs. Do not share needles. Ask your health care provider for help if you need support or information about quitting drugs. Alcohol use Do not drink alcohol if: Your health care provider tells you not to drink. You are pregnant, may be pregnant, or are planning to become pregnant. If you drink alcohol: Limit how much you use to 0-1 drink a day. Limit intake if you are breastfeeding. Be aware of how much alcohol is in your drink. In the [place] one drink equals one 12 oz bottle of beer (355 mL), one 5 oz glass of wine (148 mL), or one 1 oz glass of hard liquor (44 mL). General instructions Schedule regular health, dental, and eye exams. Stay current with your vaccines. Tell your health care provider if: You often feel depressed. You have ever been abused or do not feel safe at home. Summary Adopting a healthy lifestyle and getting preventive care are important in promoting health and wellness. Follow your health care provider's instructions about healthy diet, exercising, and getting tested or screened for diseases. Follow your health care provider's  instructions on monitoring your cholesterol and blood pressure. This information is not intended to replace advice given to you by your health care provider. Make sure you discuss any questions you have with your health care provider. Document Revised: 07/01/2020 Document Reviewed: 04/16/2018 Elsevier Patient Education  2022 [employer]  

## 2021-01-23 NOTE — Assessment & Plan Note (Signed)
Well-controlled hypertension.  Continue Hyzaar 100-25 mg daily and amlodipine 10 mg daily.

## 2021-01-23 NOTE — Assessment & Plan Note (Signed)
Cardiovascular and cancer risk associated with smoking discussed. Smoking cessation advice given. 

## 2021-01-23 NOTE — Assessment & Plan Note (Signed)
Chronic anxiety and depression.  On Celexa 20 mg daily.  Needs follow-up with psychiatry.  Referral placed today.

## 2021-01-23 NOTE — Progress Notes (Signed)
Wendy Carr 65 y.o.   Chief Complaint  Patient presents with   Hypertension    Medication refill losartan, tramodol, citalopram   Foot Pain    HISTORY OF PRESENT ILLNESS: This is a 65 y.o. female here to establish care with me. Has the following chronic medical problems: 1.  Hypertension: On Hyzaar and amlodipine 2.  Chronic pain syndrome.  Needs referral to pain management clinic 3.  Chronic smoker: Presently vaping 4.  History of asthma 5.  History of chronic depression and anxiety.  Needs referral to behavioral services 6.  Chronic pain to right foot.  Went to emergency room yesterday and was prescribed meloxicam 7.5 mg for pain Requesting medication refills on losartan citalopram and tramadol. Seen in the emergency room yesterday for bilateral foot pain.  Assessment and plan as follows: MDM Rules/Calculators/A&P                             Patient presented for evaluation of bilateral foot pain.  On exam, patient appears nontoxic.  While there is no clear signs of infection, patient does have increasing pain in areas that were recently scraped, as such, will cover with antibiotics.  However most likely inflammatory process.  Will treat with anti-inflammatories.  Discussed follow-up with podiatry.  Without trauma or injury, doubt fracture dislocation, I do not believe emergent x-rays to be beneficial.  At this time, patient appears safe for discharge. return precautions given.  Patient states she understands and agrees to plan   Final Clinical Impression(s) / ED Diagnoses Final diagnoses:  Bilateral foot pain      Rx / DC Orders ED Discharge Orders             Ordered      meloxicam (MOBIC) 7.5 MG tablet  Daily        01/22/21 0818      doxycycline (VIBRAMYCIN) 100 MG capsule  2 times daily        01/22/21 0818                  Caccavale, Sophia, PA-C 01/22/21 0825     Varney Biles, MD 01/22/21 0931    HPI   Prior to Admission medications    Medication Sig Start Date End Date Taking? Authorizing Provider  acetaminophen (TYLENOL) 500 MG tablet Take 2 tablets (1,000 mg total) by mouth every 6 (six) hours as needed. Patient taking differently: Take 1,000 mg by mouth every 6 (six) hours as needed for mild pain, fever or headache. 06/05/16  Yes Pfeiffer, Jeannie Done, MD  albuterol (PROVENTIL HFA;VENTOLIN HFA) 108 (90 Base) MCG/ACT inhaler Inhale 1-2 puffs into the lungs every 6 (six) hours as needed for wheezing or shortness of breath. 06/19/17  Yes Lance Sell, NP  albuterol (PROVENTIL) (2.5 MG/3ML) 0.083% nebulizer solution TAKE 3 ML (2.5 MG TOTAL) BY NEBULIZATION EVERY 6 (SIX) HOURS IF NEEDED FOR WHEEZING. 03/04/20 03/04/21 Yes Herbert Spires, NP  albuterol (VENTOLIN HFA) 108 (90 Base) MCG/ACT inhaler INHALE 2 PUFFS EVERY 4 (FOUR) HOURS IF NEEDED FOR WHEEZING. 03/04/20 03/04/21 Yes Herbert Spires, NP  amlodipine-atorvastatin (CADUET) 10-10 MG tablet Take 1 tablet by mouth daily. PT is unsure of dose.   Yes [provider]  BIOTIN PO Take 1 tablet by mouth daily.   Yes [provider]  budesonide-formoterol (SYMBICORT) 80-4.5 MCG/ACT inhaler Inhale 2 puffs into the lungs 2 (two) times daily. 08/14/17  Yes Marrian Salvage,  FNP  citalopram (CELEXA) 20 MG tablet Take 1.5 tablets (30 mg total) by mouth daily. 11/12/20 01/23/21 Yes Covington, Holli Humbles, PA-C  cloNIDine (CATAPRES) 0.1 MG tablet Take 1 tablet (0.1 mg total) by mouth 3 (three) times daily. 11/12/20  Yes Covington, Judson Roch M, PA-C  diclofenac sodium (VOLTAREN) 1 % GEL Apply 4 g topically 4 (four) times daily as needed. 01/20/18  Yes Hilts, Legrand Como, MD  doxycycline (VIBRAMYCIN) 100 MG capsule Take 1 capsule (100 mg total) by mouth 2 (two) times daily for 7 days. 01/22/21 01/29/21 Yes Caccavale, Sophia, PA-C  Fe Fum-Fe Poly-Vit C-Lactobac (FUSION PO) Take 1 tablet by mouth daily.   Yes [provider]  guaiFENesin (ROBITUSSIN) 100 MG/5ML liquid Take 10  mLs (200 mg total) by mouth every 4 (four) hours as needed for cough. 08/10/17  Yes Jacqlyn Larsen, PA-C  ipratropium-albuterol (DUONEB) 0.5-2.5 (3) MG/3ML SOLN Take 3 mLs by nebulization every 6 (six) hours as needed. Patient taking differently: Take 3 mLs by nebulization every 6 (six) hours as needed (shortness of breath/wheezing). 05/31/14  Yes Le, Thao P, DO  loratadine (CLARITIN) 10 MG tablet Take 10 mg by mouth daily as needed for allergies.   Yes [provider]  losartan-hydrochlorothiazide (HYZAAR) 100-25 MG tablet TAKE 1 TABLET BY MOUTH DAILY. 07/10/20 07/10/21 Yes Tacy Learn, PA-C  melatonin 5 MG TABS Take 1 tablet (5 mg total) by mouth at bedtime as needed. 11/12/20  Yes Covington, Sarah M, PA-C  meloxicam (MOBIC) 7.5 MG tablet Take 1 tablet (7.5 mg total) by mouth daily. 01/22/21  Yes Caccavale, Sophia, PA-C  methocarbamol (ROBAXIN) 500 MG tablet Take 1 tablet (500 mg total) by mouth 2 (two) times daily as needed for muscle spasms. 01/04/21  Yes Noemi Chapel, MD  montelukast (SINGULAIR) 10 MG tablet Take 1 tablet (10 mg total) by mouth at bedtime. Patient taking differently: Take 10 mg by mouth at bedtime as needed (seasonal allergies). 08/14/17  Yes Marrian Salvage, FNP  omeprazole (PRILOSEC) 20 MG capsule Take 1 capsule (20 mg total) by mouth daily. 11/12/20  Yes Covington, Holli Humbles, PA-C  Spacer/Aero-Holding Chambers (AEROCHAMBER PLUS WITH MASK) inhaler Use as instructed 06/05/16  Yes Charlesetta Shanks, MD  topiramate (TOPAMAX) 50 MG tablet Take 1 tablet (50 mg total) by mouth daily. 11/12/20  Yes Covington, Judson Roch M, PA-C  traZODone (DESYREL) 50 MG tablet Take 1 tablet (50 mg total) by mouth at bedtime as needed for insomnia 11/10/20  Yes   vitamin B-12 (CYANOCOBALAMIN) 500 MCG tablet Take 500 mcg by mouth daily.   Yes [provider]  propranolol (INDERAL) 10 MG tablet Take 1 tablet (10 mg total) by mouth every 8 (eight) hours. 11/12/20 12/12/20  Hughie Closs, PA-C     Allergies  Allergen Reactions   Naproxen Other (See Comments)    Stomach cramps     Patient Active Problem List   Diagnosis Date Noted   Thyroid dysfunction 06/19/2017   Routine general medical examination at a health care facility 07/07/2015   Sleep disturbance 07/07/2015   Arthritis 03/30/2014   Opiate withdrawal ([place]) 12/16/2011   Cocaine abuse ([place]) 12/16/2011   Backache 09/03/2008   HYPERGLYCEMIA, BORDERLINE 09/03/2008   ANEMIA-NOS 08/20/2008   Anxiety and depression 08/20/2008   Essential hypertension 08/20/2008   ALLERGIC RHINITIS 08/20/2008   Asthma 08/20/2008   GERD 08/20/2008    Past Medical History:  Diagnosis Date   ALLERGIC RHINITIS    Allergy    ANEMIA-NOS  ANXIETY    Arthritis    ASTHMA    Blood transfusion without reported diagnosis    GERD    HYPERGLYCEMIA, BORDERLINE    HYPERTENSION    HYPOTHYROIDISM    Personal history of colonic polyps 2011   last colonscopy 2011: no polyps.    Past Surgical History:  Procedure Laterality Date   BREAST SURGERY  2004   Lumpectomy, right side-Benign   COLONOSCOPY     DILATION AND CURETTAGE OF UTERUS     FOOT SURGERY  2006   right   [place] [facility]   THYROIDECTOMY, PARTIAL  2004   Left, benign   VEIN SURGERY  1997   left arm    Social History   Socioeconomic History   Marital status: Married    Spouse name: Not on file   Number of children: 0   Years of education: 16   Highest education level: Not on file  Occupational History   Not on file  Tobacco Use   Smoking status: Every Day    Packs/day: 0.40    Years: 7.00    Pack years: 2.80    Types: Cigarettes   Smokeless tobacco: Never   Tobacco comments:    Married 2009-lives with spouse, no kids  Substance and Sexual Activity   Alcohol use: No    Alcohol/week: 0.0 standard drinks   Drug use: Yes    Types: Cocaine    Comment: Last time usage was December 2016 - heroin and cocaine   Sexual  activity: Yes    Partners: Male    Birth control/protection: Abstinence  Other Topics Concern   Not on file  Social History Narrative   Denies abuse and feels safe at home.    Social Determinants of Health   Financial Resource Strain: Not on file  Food Insecurity: Not on file  Transportation Needs: Not on file  Physical Activity: Not on file  Stress: Not on file  Social Connections: Not on file  Intimate Partner Violence: Not on file    Family History  Problem Relation Age of Onset   Hypertension Mother    Diabetes Sister    Arthritis Other    Diabetes Other    Pancreatic cancer Maternal Uncle    Diabetes Maternal Grandmother    Ovarian cancer Maternal Grandmother    Colon cancer Maternal Grandmother    Esophageal cancer Neg Hx    Stomach cancer Neg Hx    Rectal cancer Neg Hx      Review of Systems  Constitutional: Negative.  Negative for chills and fever.  HENT: Negative.  Negative for congestion and sore throat.   Respiratory: Negative.  Negative for cough and shortness of breath.   Cardiovascular: Negative.  Negative for chest pain and palpitations.  Gastrointestinal: Negative.  Negative for abdominal pain, diarrhea, nausea and vomiting.  Skin: Negative.  Negative for rash.  Neurological: Negative.  Negative for dizziness and headaches.  All other systems reviewed and are negative.  Today's Vitals   01/23/21 0813  BP: 134/78  Pulse: 77  Temp: 97.8 F (36.6 C)  TempSrc: Oral  SpO2: 98%  Weight: 187 lb (84.8 kg)  Height: '5\' 2"'$  (1.575 m)   Body mass index is 34.2 kg/m.  Physical Exam Vitals reviewed.  Constitutional:      Appearance: Normal appearance.  HENT:     Head: Normocephalic.  Eyes:     Extraocular Movements: Extraocular movements  intact.     Pupils: Pupils are equal, round, and reactive to light.  Cardiovascular:     Rate and Rhythm: Normal rate and regular rhythm.     Pulses: Normal pulses.     Heart sounds: Normal heart sounds.   Pulmonary:     Effort: Pulmonary effort is normal.     Breath sounds: Normal breath sounds.  Musculoskeletal:     Cervical back: Normal range of motion and neck supple.     Comments: Right foot: Brace on  Skin:    General: Skin is warm and dry.     Capillary Refill: Capillary refill takes less than 2 seconds.  Neurological:     General: No focal deficit present.     Mental Status: She is alert and oriented to person, place, and time.  Psychiatric:        Mood and Affect: Mood normal.        Behavior: Behavior normal.     ASSESSMENT & PLAN: Essential hypertension Well-controlled hypertension.  Continue Hyzaar 100-25 mg daily and amlodipine 10 mg daily.  Anxiety and depression Chronic anxiety and depression.  On Celexa 20 mg daily.  Needs follow-up with psychiatry.  Referral placed today.  Chronic pain syndrome Chronic pain with a history of opiate abuse.  Needs follow-up with pain management clinic.  Patient understands I will not be her chronic pain management doctor and will not be prescribing controlled substances for her.  Current smoker Cardiovascular and cancer risk associated with smoking discussed. Smoking cessation advice given.  Dyslipidemia Diet and nutrition discussed.  Continue statin medication.  Avena was seen today for hypertension and foot pain.  Diagnoses and all orders for this visit:  Essential hypertension -     losartan-hydrochlorothiazide (HYZAAR) 100-25 MG tablet; Take 1 tablet by mouth daily. -     amlodipine-atorvastatin (CADUET) 10-10 MG tablet; Take 1 tablet by mouth daily. PT is unsure of dose.  Need for influenza vaccination -     Flu Vaccine QUAD 21moIM (Fluarix, Fluzone & Alfiuria Quad PF)  Chronic pain syndrome -     Ambulatory referral to Pain Clinic  History of asthma  Current smoker  Chronic depression -     citalopram (CELEXA) 20 MG tablet; Take 1.5 tablets (30 mg total) by mouth daily. -     Ambulatory referral to  Psychiatry  Encounter to establish care  Dyslipidemia -     amlodipine-atorvastatin (CADUET) 10-10 MG tablet; Take 1 tablet by mouth daily. PT is unsure of dose.  Anxiety and depression  Patient Instructions  Health Maintenance, Female Adopting a healthy lifestyle and getting preventive care are important in promoting health and wellness. Ask your health care provider about: The right schedule for you to have regular tests and exams. Things you can do on your own to prevent diseases and keep yourself healthy. What should I know about diet, weight, and exercise? Eat a healthy diet  Eat a diet that includes plenty of vegetables, fruits, low-fat dairy products, and lean protein. Do not eat a lot of foods that are high in solid fats, added sugars, or sodium. Maintain a healthy weight Body mass index (BMI) is used to identify weight problems. It estimates body fat based on height and weight. Your health care provider can help determine your BMI and help you achieve or maintain a healthy weight. Get regular exercise Get regular exercise. This is one of the most important things you can do for your health. Most adults should:  Exercise for at least 150 minutes each week. The exercise should increase your heart rate and make you sweat (moderate-intensity exercise). Do strengthening exercises at least twice a week. This is in addition to the moderate-intensity exercise. Spend less time sitting. Even light physical activity can be beneficial. Watch cholesterol and blood lipids Have your blood tested for lipids and cholesterol at 63 years of age, then have this test every 5 years. Have your cholesterol levels checked more often if: Your lipid or cholesterol levels are high. You are older than 63 years of age. You are at high risk for heart disease. What should I know about cancer screening? Depending on your health history and family history, you may need to have cancer screening at various  ages. This may include screening for: Breast cancer. Cervical cancer. Colorectal cancer. Skin cancer. Lung cancer. What should I know about heart disease, diabetes, and high blood pressure? Blood pressure and heart disease High blood pressure causes heart disease and increases the risk of stroke. This is more likely to develop in people who have high blood pressure readings, are of African descent, or are overweight. Have your blood pressure checked: Every 3-5 years if you are 106-28 years of age. Every year if you are 43 years old or older. Diabetes Have regular diabetes screenings. This checks your fasting blood sugar level. Have the screening done: Once every three years after age 26 if you are at a normal weight and have a low risk for diabetes. More often and at a younger age if you are overweight or have a high risk for diabetes. What should I know about preventing infection? Hepatitis B If you have a higher risk for hepatitis B, you should be screened for this virus. Talk with your health care provider to find out if you are at risk for hepatitis B infection. Hepatitis C Testing is recommended for: Everyone born from [birth year] through 1963. Anyone with known risk factors for hepatitis C. Sexually transmitted infections (STIs) Get screened for STIs, including gonorrhea and chlamydia, if: You are sexually active and are younger than 63 years of age. You are older than 63 years of age and your health care provider tells you that you are at risk for this type of infection. Your sexual activity has changed since you were last screened, and you are at increased risk for chlamydia or gonorrhea. Ask your health care provider if you are at risk. Ask your health care provider about whether you are at high risk for HIV. Your health care provider may recommend a prescription medicine to help prevent HIV infection. If you choose to take medicine to prevent HIV, you should first get tested for HIV.  You should then be tested every 3 months for as long as you are taking the medicine. Pregnancy If you are about to stop having your period (premenopausal) and you may become pregnant, seek counseling before you get pregnant. Take 400 to 800 micrograms (mcg) of folic acid every day if you become pregnant. Ask for birth control (contraception) if you want to prevent pregnancy. Osteoporosis and menopause Osteoporosis is a disease in which the bones lose minerals and strength with aging. This can result in bone fractures. If you are 50 years old or older, or if you are at risk for osteoporosis and fractures, ask your health care provider if you should: Be screened for bone loss. Take a calcium or vitamin D supplement to lower your risk of fractures. Be given hormone replacement therapy (  HRT) to treat symptoms of menopause. Follow these instructions at home: Lifestyle Do not use any products that contain nicotine or tobacco, such as cigarettes, e-cigarettes, and chewing tobacco. If you need help quitting, ask your health care provider. Do not use street drugs. Do not share needles. Ask your health care provider for help if you need support or information about quitting drugs. Alcohol use Do not drink alcohol if: Your health care provider tells you not to drink. You are pregnant, may be pregnant, or are planning to become pregnant. If you drink alcohol: Limit how much you use to 0-1 drink a day. Limit intake if you are breastfeeding. Be aware of how much alcohol is in your drink. In the [place] one drink equals one 12 oz bottle of beer (355 mL), one 5 oz glass of wine (148 mL), or one 1 oz glass of hard liquor (44 mL). General instructions Schedule regular health, dental, and eye exams. Stay current with your vaccines. Tell your health care provider if: You often feel depressed. You have ever been abused or do not feel safe at home. Summary Adopting a healthy lifestyle and getting preventive  care are important in promoting health and wellness. Follow your health care provider's instructions about healthy diet, exercising, and getting tested or screened for diseases. Follow your health care provider's instructions on monitoring your cholesterol and blood pressure. This information is not intended to replace advice given to you by your health care provider. Make sure you discuss any questions you have with your health care provider. Document Revised: 07/01/2020 Document Reviewed: 04/16/2018 Elsevier Patient Education  2022 [place], MD [facility]

## 2021-03-01 ENCOUNTER — Encounter: Payer: Self-pay | Admitting: Physical Medicine and Rehabilitation

## 2021-04-17 ENCOUNTER — Other Ambulatory Visit: Payer: Self-pay | Admitting: Emergency Medicine

## 2021-04-17 ENCOUNTER — Telehealth: Payer: Self-pay

## 2021-04-17 MED ORDER — TRAZODONE HCL 50 MG PO TABS: 50.0000 mg | ORAL_TABLET | Freq: Every evening | ORAL | 0 refills | Status: DC | PRN

## 2021-04-17 NOTE — Telephone Encounter (Signed)
1.Medication Requested: traZODone (DESYREL) 50 MG tablet 2. Pharmacy (Name, [place]): [id number] - [address], [place] Phone:  [phone]  Fax:  [phone]     3. On Med List: Y   4. Last Visit with PCP: 01/23/21  5. Next visit date with PCP: n/a   Agent: Please be advised that RX refills may take up to 3 business days. We ask that you follow-up with your pharmacy.

## 2021-04-17 NOTE — Telephone Encounter (Signed)
New prescription sent to pharmacy of record

## 2021-04-30 ENCOUNTER — Other Ambulatory Visit: Payer: Self-pay | Admitting: Emergency Medicine

## 2021-05-14 ENCOUNTER — Other Ambulatory Visit: Payer: Self-pay | Admitting: Emergency Medicine

## 2021-05-31 ENCOUNTER — Encounter: Payer: Self-pay | Admitting: Physical Medicine and Rehabilitation

## 2021-05-31 ENCOUNTER — Other Ambulatory Visit: Payer: Self-pay

## 2021-05-31 ENCOUNTER — Encounter: Payer: 59 | Attending: Physical Medicine and Rehabilitation | Admitting: Physical Medicine and Rehabilitation

## 2021-05-31 VITALS — BP 120/77 | HR 58 | Ht 62.0 in | Wt 199.4 lb

## 2021-05-31 DIAGNOSIS — L84 Corns and callosities: Secondary | ICD-10-CM | POA: Diagnosis present

## 2021-05-31 DIAGNOSIS — M545 Low back pain, unspecified: Secondary | ICD-10-CM | POA: Diagnosis present

## 2021-05-31 DIAGNOSIS — F32A Depression, unspecified: Secondary | ICD-10-CM | POA: Diagnosis present

## 2021-05-31 DIAGNOSIS — G8929 Other chronic pain: Secondary | ICD-10-CM | POA: Diagnosis present

## 2021-05-31 DIAGNOSIS — G894 Chronic pain syndrome: Secondary | ICD-10-CM | POA: Diagnosis present

## 2021-05-31 MED ORDER — DULOXETINE HCL 30 MG PO CPEP: 30.0000 mg | ORAL_CAPSULE | Freq: Every day | ORAL | 3 refills | Status: DC

## 2021-05-31 MED ORDER — DICLOFENAC SODIUM 75 MG PO TBEC: 75.0000 mg | DELAYED_RELEASE_TABLET | Freq: Two times a day (BID) | ORAL | 5 refills | Status: AC

## 2021-05-31 NOTE — Patient Instructions (Addendum)
°  Pt is a 66 yr old female with hx of HTN; hypothyroidism;  Here for evaluation of chronic low back pain, L knee and L hip pain.  Hx of kidney and gall stones in last year.  Here for evaluation.    Suggest sending Buspar to [facility]- since good Rx they are ~$4 - get PCP to do.   2.  Diclofenac 75 mg  2x/day as needed for joint pain-  #60 5 refills- will send to [facility].   3. Unfortunately, there's not a cure for arthritis-but will work on trying to treat her pain.    4.  Will refer to Podiatry - will place referral for for painful callus on R foot. Placed today.    5. Duloxetine  Duloxetine /Cymbalta 30 mg daily or nightly x 1 week  Then 60 mg daily or nightly- for nerve pain  1% of patients can have nausea for 7-10 days with Duloxetine- call me if needs an anti-nausea medicine. Can also cause mild dry mouth/dry eyes and mild constipation.    6.  Duloxetine- Don't have PCP give you Citalopram as well- don't mix with Citalopram but can be taken with Buspar.    7. Has recent hx of use/been clean almost 2 months- will wait on opiates.    8. F/U - 3 months. Call in 3-4 weeks to let me know how things working.
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
## 2021-06-03 ENCOUNTER — Other Ambulatory Visit: Payer: Self-pay | Admitting: Emergency Medicine

## 2021-06-06 ENCOUNTER — Encounter: Payer: Self-pay | Admitting: Podiatrist

## 2021-06-06 ENCOUNTER — Ambulatory Visit (INDEPENDENT_AMBULATORY_CARE_PROVIDER_SITE_OTHER): Payer: 59 | Admitting: Podiatrist

## 2021-06-06 ENCOUNTER — Other Ambulatory Visit: Payer: Self-pay

## 2021-06-06 DIAGNOSIS — M79609 Pain in unspecified limb: Secondary | ICD-10-CM | POA: Diagnosis not present

## 2021-06-06 DIAGNOSIS — L851 Acquired keratosis [keratoderma] palmaris et plantaris: Secondary | ICD-10-CM

## 2021-06-06 DIAGNOSIS — B351 Tinea unguium: Secondary | ICD-10-CM | POA: Diagnosis not present

## 2021-06-06 DIAGNOSIS — M216X9 Other acquired deformities of unspecified foot: Secondary | ICD-10-CM | POA: Diagnosis not present

## 2021-06-06 NOTE — Patient Instructions (Signed)

## 2021-06-06 NOTE — Progress Notes (Signed)
Chief Complaint  Patient presents with   Debridement    Requesting toenail and callus trim - unable to trim herself, previous hammer toe surgery right, 2nd toe left - some discomfort-redness   New Patient (Initial Visit)     HPI: Patient is 66 y.o. female who presents today for a new patient examination and requesting a toenail and callus trim for painful nails and calluses.  She also relates some discomfort on the right second toe where she states she previously had a hammertoe correction performed.  She is not diabetic denies any neuropathic pain or symptomatology.  Patient Active Problem List   Diagnosis Date Noted   Chronic bilateral low back pain without sciatica 05/31/2021   Chronic pain syndrome 01/23/2021   History of asthma 01/23/2021   Current smoker 01/23/2021   Chronic depression 01/23/2021   Dyslipidemia 01/23/2021   Lightheadedness 10/06/2017   Thyroid dysfunction 06/19/2017   Sleep disturbance 07/07/2015   Arthritis 03/30/2014   Opiate withdrawal ([place]) 12/16/2011   Cocaine abuse ([place]) 12/16/2011   Backache 09/03/2008   HYPERGLYCEMIA, BORDERLINE 09/03/2008   ANEMIA-NOS 08/20/2008   Anxiety and depression 08/20/2008   Essential hypertension 08/20/2008   ALLERGIC RHINITIS 08/20/2008   Asthma 08/20/2008   GERD 08/20/2008    Current Outpatient Medications on File Prior to Visit  Medication Sig Dispense Refill   Multiple Vitamin (MULTIVITAMIN) capsule Take 1 capsule by mouth daily.     albuterol (PROVENTIL HFA;VENTOLIN HFA) 108 (90 Base) MCG/ACT inhaler Inhale 1-2 puffs into the lungs every 6 (six) hours as needed for wheezing or shortness of breath. 1 Inhaler 2   diclofenac (VOLTAREN) 75 MG EC tablet Take 1 tablet (75 mg total) by mouth 2 (two) times daily. 60 tablet 5   DULoxetine (CYMBALTA) 30 MG capsule Take 1 capsule (30 mg total) by mouth daily. X 1 week, then 60 mg dialy- for back/knee pain as well as mood 60 capsule 3   losartan-hydrochlorothiazide (HYZAAR)  100-25 MG tablet Take 1 tablet by mouth daily. 90 tablet 1   No current facility-administered medications on file prior to visit.    Allergies  Allergen Reactions   Naproxen Other (See Comments)    Stomach cramps     Review of Systems No fevers, chills, nausea, muscle aches, no difficulty breathing, no calf pain, no chest pain or shortness of breath.   Physical Exam  GENERAL APPEARANCE: Alert, conversant. Appropriately groomed. No acute distress.   VASCULAR: Pedal pulses palpable DP and PT bilateral.  Capillary refill time is immediate to all digits,  Proximal to distal cooling it warm to warm.  Digital perfusion adequate.   NEUROLOGIC: sensation is intact to 5.07 monofilament at 5/5 sites bilateral.  Light touch is intact bilateral, vibratory sensation intact bilateral  MUSCULOSKELETAL: acceptable muscle strength, tone and stability bilateral.  Hammertoes 2 through 5 noted bilateral.  The hammertoe on the right second and third toes has recurred status post surgery from 2006.  Splaying of the second and third toes of the left foot is noted as well.  DERMATOLOGIC: skin is warm, supple, and dry.  No open lesions noted.  No rash, no pre ulcerative lesions.  Digital nails are thick, discolored, dystrophic, with subungual debris present 1 through 5 bilateral.  Well-circumscribed porokeratotic lesions are located submetatarsal 5 of the left foot and submetatarsal 4 of the right foot.  Pain with direct pressure is noted.  Skin tension lines are present throughout.  Assessment     ICD-10-CM   1.  Pain due to onychomycosis of nail  B35.1    M79.609     2. Plantar flexed metatarsal, unspecified laterality  M21.6X9     3. Acquired plantar porokeratosis  L85.1        Plan  I debrided the painful toenails today without complication with a nail nipper and power bur.  I also pared the calluses as a courtesy at today's visit as well.  Recommended over-the-counter creams and soft and  accommodating shoes.  She will be seen back in 3 months for routine care and if any concerns arise prior to that visit she is directed to call.

## 2021-06-14 ENCOUNTER — Other Ambulatory Visit: Payer: Self-pay

## 2021-06-14 ENCOUNTER — Ambulatory Visit: Payer: 59 | Attending: Internal Medicine

## 2021-06-14 ENCOUNTER — Other Ambulatory Visit (HOSPITAL_BASED_OUTPATIENT_CLINIC_OR_DEPARTMENT_OTHER): Payer: Self-pay

## 2021-06-14 DIAGNOSIS — Z23 Encounter for immunization: Secondary | ICD-10-CM

## 2021-06-14 MED ORDER — PFIZER COVID-19 VAC BIVALENT 30 MCG/0.3ML IM SUSP
INTRAMUSCULAR | 0 refills | Status: DC
  Filled 2021-06-14: qty 0.3, 1d supply, fill #0

## 2021-06-14 NOTE — Progress Notes (Signed)
° °  Covid-19 Vaccination Clinic  Name:  Wendy Carr    MRN: [id number] DOB: 20-Feb-1956  06/14/2021  Wendy Carr was observed post Covid-19 immunization for 15 minutes. Approximately 2 minutes post vaccine administration, patient reported lightheadedness. She was provided with water to drink. I did check her blood pressure, which was elevated at 162/100 mmHg with a pulse of 72. I had her sit for 5 more minutes, and offered to recheck her blood pressure however the patient denied. The patient and her husband felt comfortable leaving. I did instruct her to seek medical help if she experiences significant dizziness, changes in vision, chest pain or trouble breathing.   She was provided with Vaccine Information Sheet and instruction to access the V-Safe system.   Wendy Carr was instructed to call 911 with any severe reactions post vaccine: Difficulty breathing  Swelling of face and throat  A fast heartbeat  A bad rash all over body  Dizziness and weakness   Immunizations Administered     Name Date Dose VIS Date Route   Pfizer Covid-19 Vaccine Bivalent Booster 06/14/2021  3:37 PM 0.3 mL 01/04/2021 Intramuscular   Manufacturer: [place]   Lot: [id number]   [place]: [place], PharmD [place] [facility] [place]: [phone]  06/14/2021 3:44 PM

## 2021-06-16 ENCOUNTER — Other Ambulatory Visit: Payer: Self-pay | Admitting: Emergency Medicine

## 2021-06-29 ENCOUNTER — Other Ambulatory Visit: Payer: Self-pay

## 2021-06-29 ENCOUNTER — Encounter (HOSPITAL_COMMUNITY): Payer: Self-pay | Admitting: Emergency Medicine

## 2021-06-29 ENCOUNTER — Emergency Department (HOSPITAL_COMMUNITY)
Admission: EM | Admit: 2021-06-29 | Discharge: 2021-06-30 | Disposition: A | Discharge status: Home / Self Care | Payer: 59 | Attending: Emergency Medicine | Admitting: Emergency Medicine

## 2021-06-29 DIAGNOSIS — Z79899 Other long term (current) drug therapy: Secondary | ICD-10-CM | POA: Diagnosis not present

## 2021-06-29 DIAGNOSIS — M79674 Pain in right toe(s): Secondary | ICD-10-CM | POA: Insufficient documentation

## 2021-06-29 DIAGNOSIS — E119 Type 2 diabetes mellitus without complications: Secondary | ICD-10-CM | POA: Insufficient documentation

## 2021-06-29 DIAGNOSIS — I10 Essential (primary) hypertension: Secondary | ICD-10-CM | POA: Insufficient documentation

## 2021-06-29 DIAGNOSIS — M79671 Pain in right foot: Secondary | ICD-10-CM

## 2021-06-29 NOTE — ED Triage Notes (Signed)
Pt reported ot ED with c/o pain to great toe and fourth toe on right foot. Pt states she recently have calluses removed.

## 2021-06-30 ENCOUNTER — Telehealth: Payer: Self-pay | Admitting: *Deleted

## 2021-06-30 ENCOUNTER — Ambulatory Visit (INDEPENDENT_AMBULATORY_CARE_PROVIDER_SITE_OTHER): Payer: 59 | Admitting: Podiatry

## 2021-06-30 DIAGNOSIS — L03031 Cellulitis of right toe: Secondary | ICD-10-CM | POA: Diagnosis not present

## 2021-06-30 MED ORDER — DOXYCYCLINE HYCLATE 100 MG PO TABS: 100.0000 mg | ORAL_TABLET | Freq: Two times a day (BID) | ORAL | 0 refills | Status: DC

## 2021-06-30 MED ORDER — MORPHINE SULFATE (PF) 4 MG/ML IV SOLN
4.0000 mg | Freq: Once | INTRAVENOUS | Status: AC
  Administered 2021-06-30: 4 mg via INTRAMUSCULAR
  Filled 2021-06-30: qty 1

## 2021-06-30 MED ORDER — ONDANSETRON 4 MG PO TBDP
8.0000 mg | ORAL_TABLET | Freq: Once | ORAL | Status: AC
  Administered 2021-06-30: 8 mg via ORAL
  Filled 2021-06-30: qty 2

## 2021-06-30 MED ORDER — OXYCODONE-ACETAMINOPHEN 5-325 MG PO TABS: 1.0000 | ORAL_TABLET | ORAL | 0 refills | Status: DC | PRN

## 2021-06-30 NOTE — ED Provider Notes (Signed)
[facility] EMERGENCY DEPARTMENT Provider Note   CSN: [id number] Arrival date & time: 06/29/21  2330     History  Chief Complaint  Patient presents with   Foot Pain    Wendy Carr is a 66 y.o. female.   Foot Pain   Patient with medical history notable for chronic pain syndrome, arthritis, hyperglycemia, asthma, hypertension, onychomycosis with recent callus removal presents due to great toe pain.  Is been intermittent for a month, worsened today.  Is not exacerbated by movement, denies any fevers.  No injury, no numbness or tingling.  No history of gout, has tried tramadol at home with minimal relief.  Home Medications Prior to Admission medications   Medication Sig Start Date End Date Taking? Authorizing Provider  albuterol (PROVENTIL HFA;VENTOLIN HFA) 108 (90 Base) MCG/ACT inhaler Inhale 1-2 puffs into the lungs every 6 (six) hours as needed for wheezing or shortness of breath. 06/19/17   Lance Sell, NP  COVID-19 mRNA bivalent vaccine, Pfizer, (PFIZER COVID-19 VAC BIVALENT) injection Inject into the muscle. 06/14/21   Carlyle Basques, MD  diclofenac (VOLTAREN) 75 MG EC tablet Take 1 tablet (75 mg total) by mouth 2 (two) times daily. 05/31/21   Lovorn, Jinny Blossom, MD  DULoxetine (CYMBALTA) 30 MG capsule Take 1 capsule (30 mg total) by mouth daily. X 1 week, then 60 mg dialy- for back/knee pain as well as mood 05/31/21   Lovorn, Jinny Blossom, MD  losartan-hydrochlorothiazide (HYZAAR) 100-25 MG tablet Take 1 tablet by mouth daily. 01/23/21 04/23/21  Horald Pollen, MD  Multiple Vitamin (MULTIVITAMIN) capsule Take 1 capsule by mouth daily.    [provider]      Allergies    Naproxen    Review of Systems   Review of Systems  Physical Exam Updated Vital Signs BP (!) 128/116    Pulse 74    Temp 98.8 F (37.1 C) (Oral)    Resp 18    Ht 5\' 2"  (1.575 m)    Wt 90 kg    SpO2 100%    BMI 36.29 kg/m  Physical Exam Vitals and nursing note reviewed.  Exam conducted with a chaperone present.  Constitutional:      General: She is not in acute distress.    Appearance: Normal appearance.  HENT:     Head: Normocephalic and atraumatic.  Eyes:     General: No scleral icterus.    Extraocular Movements: Extraocular movements intact.     Pupils: Pupils are equal, round, and reactive to light.  Cardiovascular:     Pulses: Normal pulses.  Musculoskeletal:        General: Tenderness present. No swelling. Normal range of motion.     Comments: Tenderness to the dorsum of first and fourth toe.  No erythema, no swelling.  Not reproducible with movement.  Skin:    Capillary Refill: Capillary refill takes less than 2 seconds.     Coloration: Skin is not jaundiced.     Findings: No erythema.  Neurological:     Mental Status: She is alert. Mental status is at baseline.     Coordination: Coordination normal.    ED Results / Procedures / Treatments   Labs (all labs ordered are listed, but only abnormal results are displayed) Labs Reviewed - No data to display  EKG None  Radiology No results found.  Procedures Procedures    Medications Ordered in ED Medications  morphine (PF) 4 MG/ML injection 4 mg (4 mg Intramuscular Given 06/30/21 0124)  ondansetron (ZOFRAN-ODT) disintegrating tablet 8 mg (8 mg Oral Given 06/30/21 0124)    ED Course/ Medical Decision Making/ A&P                           Medical Decision Making Problems addressed-right foot pain  Amount and/or Complexity of Data Reviewed External Data Reviewed: notes.    Details: I reviewed patient's visit with internal medicine, chronic pain management and podiatry.  She is currently managed outpatient, on tramadol.  Risk Prescription drug management.   Patient is a 66 year old female presenting with right foot/toe pain.  Differential diagnosis includes but is not limited to gout, septic joint, occult fracture, cellulitis  Patient is neurovascularly intact on exam.  Pain  does not reproducible with movement, is not warm or erythematous as I would expect with gout or cellulitis.  No crepitus, no point tenderness.  Patient engaged in shared decision-making, discussed that although I considered x-rays I do not feel it would be revealing given there is no injury or reproducible pain with movement.  Patient is in agreement.  I ordered 4 mg of morphine IM to treat pain acutely.  Reevaluations pain improved.  Advised patient to follow-up with her podiatrist or chronic pain doctor tomorrow morning regarding outpatient medication treatment.  Given she is already seen by chronic pain management I do not feel comfortable refilling her tramadol.  Patient discharged in stable condition.        Final Clinical Impression(s) / ED Diagnoses Final diagnoses:  Foot pain, right    Rx / DC Orders ED Discharge Orders     None         Sherrill Raring, PA-C 06/30/21 0228    Deno Etienne, DO 06/30/21 [phone]

## 2021-06-30 NOTE — Discharge Instructions (Addendum)
Take your home medicine as prescribed. Call your pain management doctor for additional refills if for this flare up of pain.

## 2021-06-30 NOTE — Telephone Encounter (Signed)
Patient called about a throbbing great toe, barely can walk, went to ER, given medication, still hurting.  Patient has seen physician in office today @2 :00.

## 2021-06-30 NOTE — ED Notes (Signed)
Pt verbalized understanding of d/c instructions, meds, and followup care. Denies questions. VSS, no distress noted. Assisted to wheelchair and out to lobby. Husband on the way to pick up.

## 2021-07-06 NOTE — Progress Notes (Signed)
Subjective:  Patient ID: Wendy Carr, female    DOB: August 13, 1955,  MRN: [id number]  Chief Complaint  Patient presents with   Toe Pain    66 y.o. female presents with the above complaint.  Patient presents with right hallux paronychia.  Patient states the pain was really bad last night she went to the emergency room.  She was discharged from the emergency room.  She states that there is some redness present.  She has not gotten any antibiotics.  She denies any other acute complaints.  She would like to discuss treatment options for this.  She states nothing is helped with the pain.  She would like to know if she can get a few pills of narcotics to help with the pain.   Review of Systems: Negative except as noted in the HPI. Denies N/V/F/Ch.  Past Medical History:  Diagnosis Date   ALLERGIC RHINITIS    Allergy    ANEMIA-NOS    ANXIETY    Arthritis    ASTHMA    Blood transfusion without reported diagnosis    GERD    HYPERGLYCEMIA, BORDERLINE    HYPERTENSION    HYPOTHYROIDISM    Personal history of colonic polyps 2011   last colonscopy 2011: no polyps.    Current Outpatient Medications:    doxycycline (VIBRA-TABS) 100 MG tablet, Take 1 tablet (100 mg total) by mouth 2 (two) times daily., Disp: 28 tablet, Rfl: 0   oxyCODONE-acetaminophen (PERCOCET) 5-325 MG tablet, Take 1 tablet by mouth every 4 (four) hours as needed for severe pain., Disp: 30 tablet, Rfl: 0   albuterol (PROVENTIL HFA;VENTOLIN HFA) 108 (90 Base) MCG/ACT inhaler, Inhale 1-2 puffs into the lungs every 6 (six) hours as needed for wheezing or shortness of breath., Disp: 1 Inhaler, Rfl: 2   COVID-19 mRNA bivalent vaccine, Pfizer, (PFIZER COVID-19 VAC BIVALENT) injection, Inject into the muscle., Disp: 0.3 mL, Rfl: 0   diclofenac (VOLTAREN) 75 MG EC tablet, Take 1 tablet (75 mg total) by mouth 2 (two) times daily., Disp: 60 tablet, Rfl: 5   DULoxetine (CYMBALTA) 30 MG capsule, Take 1 capsule (30 mg total) by mouth  daily. X 1 week, then 60 mg dialy- for back/knee pain as well as mood, Disp: 60 capsule, Rfl: 3   losartan-hydrochlorothiazide (HYZAAR) 100-25 MG tablet, Take 1 tablet by mouth daily., Disp: 90 tablet, Rfl: 1   Multiple Vitamin (MULTIVITAMIN) capsule, Take 1 capsule by mouth daily., Disp: , Rfl:   Social History   Tobacco Use  Smoking Status Every Day   Packs/day: 0.40   Years: 7.00   Pack years: 2.80   Types: Cigarettes  Smokeless Tobacco Never  Tobacco Comments   Married 2009-lives with spouse, no kids    Allergies  Allergen Reactions   Naproxen Other (See Comments)    Stomach cramps    Objective:  There were no vitals filed for this visit. There is no height or weight on file to calculate BMI. Constitutional Well developed. Well nourished.  Vascular Dorsalis pedis pulses palpable bilaterally. Posterior tibial pulses palpable bilaterally. Capillary refill normal to all digits.  No cyanosis or clubbing noted. Pedal hair growth normal.  Neurologic Normal speech. Oriented to person, place, and time. Epicritic sensation to light touch grossly present bilaterally.  Dermatologic Right hallux paronychia noted no ingrown noted.  Redness noted to the proximal nail fold.  Pain on palpation.  No area of fluctuance open wound noted.  Orthopedic: Normal joint ROM without pain or crepitus bilaterally. No  visible deformities. No bony tenderness.   Radiographs: None Assessment:   1. Paronychia of great toe of right foot    Plan:  Patient was evaluated and treated and all questions answered.  Right hallux paronychia -All questions and concerns were discussed with the patient in extensive detail given the amount of redness is present I believe she will benefit from doxycycline course.  Doxycycline was sent to the pharmacy.  For pain management she will benefit from Percocet.  I also believe she will benefit from surgical shoe to take the pressure off of the big toe.  I discussed all  this with the patient she states understanding. -If there is no improvement we will get further imaging.  No follow-ups on file.

## 2021-07-21 ENCOUNTER — Other Ambulatory Visit: Payer: Self-pay | Admitting: Emergency Medicine

## 2021-07-21 ENCOUNTER — Ambulatory Visit (INDEPENDENT_AMBULATORY_CARE_PROVIDER_SITE_OTHER): Payer: 59 | Admitting: Podiatry

## 2021-07-21 ENCOUNTER — Other Ambulatory Visit: Payer: Self-pay

## 2021-07-21 DIAGNOSIS — L03031 Cellulitis of right toe: Secondary | ICD-10-CM

## 2021-07-21 DIAGNOSIS — I10 Essential (primary) hypertension: Secondary | ICD-10-CM

## 2021-07-21 MED ORDER — OXYCODONE-ACETAMINOPHEN 5-325 MG PO TABS: 1.0000 | ORAL_TABLET | ORAL | 0 refills | Status: DC | PRN

## 2021-07-25 NOTE — Progress Notes (Signed)
?Subjective:  ?Patient ID: Wendy Carr, female    DOB: 1955-11-28,  MRN: [id number] ? ?Chief Complaint  ?Patient presents with  ? Toe Pain  ?  Right hallux toe pain   ? ? ?66 y.o. female presents with the above complaint.  Patient presents with right hallux paronychia.  She states the doxycycline definitely helped a little bit.  She said there is some redness present.  She would like to discuss other treatment plan.  She denies any other acute complaints. ? ? ?Review of Systems: Negative except as noted in the HPI. Denies N/V/F/Ch. ? ?Past Medical History:  ?Diagnosis Date  ? ALLERGIC RHINITIS   ? Allergy   ? ANEMIA-NOS   ? ANXIETY   ? Arthritis   ? ASTHMA   ? Blood transfusion without reported diagnosis   ? GERD   ? HYPERGLYCEMIA, BORDERLINE   ? HYPERTENSION   ? HYPOTHYROIDISM   ? Personal history of colonic polyps 2011  ? last colonscopy 2011: no polyps.  ? ? ?Current Outpatient Medications:  ?  oxyCODONE-acetaminophen (PERCOCET) 5-325 MG tablet, Take 1 tablet by mouth every 4 (four) hours as needed for severe pain., Disp: 30 tablet, Rfl: 0 ?  albuterol (PROVENTIL HFA;VENTOLIN HFA) 108 (90 Base) MCG/ACT inhaler, Inhale 1-2 puffs into the lungs every 6 (six) hours as needed for wheezing or shortness of breath., Disp: 1 Inhaler, Rfl: 2 ?  COVID-19 mRNA bivalent vaccine, Pfizer, (PFIZER COVID-19 VAC BIVALENT) injection, Inject into the muscle., Disp: 0.3 mL, Rfl: 0 ?  diclofenac (VOLTAREN) 75 MG EC tablet, Take 1 tablet (75 mg total) by mouth 2 (two) times daily., Disp: 60 tablet, Rfl: 5 ?  doxycycline (VIBRA-TABS) 100 MG tablet, Take 1 tablet (100 mg total) by mouth 2 (two) times daily., Disp: 28 tablet, Rfl: 0 ?  DULoxetine (CYMBALTA) 30 MG capsule, Take 1 capsule (30 mg total) by mouth daily. X 1 week, then 60 mg dialy- for back/knee pain as well as mood, Disp: 60 capsule, Rfl: 3 ?  losartan-hydrochlorothiazide (HYZAAR) 100-25 MG tablet, TAKE 1 TABLET BY MOUTH DAILY, Disp: 90 tablet, Rfl: 1 ?  Multiple Vitamin  (MULTIVITAMIN) capsule, Take 1 capsule by mouth daily., Disp: , Rfl:  ?  oxyCODONE-acetaminophen (PERCOCET) 5-325 MG tablet, Take 1 tablet by mouth every 4 (four) hours as needed for severe pain., Disp: 30 tablet, Rfl: 0 ? ?Social History  ? ?Tobacco Use  ?Smoking Status Every Day  ? Packs/day: 0.40  ? Years: 7.00  ? Pack years: 2.80  ? Types: Cigarettes  ?Smokeless Tobacco Never  ?Tobacco Comments  ? Married 2009-lives with spouse, no kids  ? ? ?Allergies  ?Allergen Reactions  ? Naproxen Other (See Comments)  ?  Stomach cramps ?  ? ?Objective:  ?There were no vitals filed for this visit. ?There is no height or weight on file to calculate BMI. ?Constitutional Well developed. ?Well nourished.  ?Vascular Dorsalis pedis pulses palpable bilaterally. ?Posterior tibial pulses palpable bilaterally. ?Capillary refill normal to all digits.  ?No cyanosis or clubbing noted. ?Pedal hair growth normal.  ?Neurologic Normal speech. ?Oriented to person, place, and time. ?Epicritic sensation to light touch grossly present bilaterally.  ?Dermatologic Right hallux paronychia noted no ingrown noted this is improving.  No further redness noted to the proximal nail fold.  Mild pain on palpation.  No area of fluctuance open wound noted.  ?Orthopedic: Normal joint ROM without pain or crepitus bilaterally. ?No visible deformities. ?No bony tenderness.  ? ?Radiographs: None ?Assessment:  ? ?  No diagnosis found. ? ?Plan:  ?Patient was evaluated and treated and all questions answered. ? ?Right hallux paronychia ?-All questions and concerns were discussed with the patient in extensive detail  ?-Doxycycline has clinically helped improve some of the redness.  Overall feels a lot better. ?-I have discussed shoe gear modification with her in extensive detail she states understanding. ? ?No follow-ups on file. ?

## 2021-08-01 ENCOUNTER — Other Ambulatory Visit: Payer: Self-pay | Admitting: Podiatry

## 2021-08-01 ENCOUNTER — Telehealth: Payer: Self-pay | Admitting: Podiatry

## 2021-08-01 MED ORDER — OXYCODONE-ACETAMINOPHEN 5-325 MG PO TABS: 1.0000 | ORAL_TABLET | ORAL | 0 refills | Status: DC | PRN

## 2021-08-01 NOTE — Telephone Encounter (Signed)
Pt is calling back from this morning about medication - she was at pharmacy to pick up refill. ? ?[address] ? ?Oxycodone ? ?Please advise. ?

## 2021-08-01 NOTE — Telephone Encounter (Signed)
[facility] on [address]    Patient called requesting a refill on pain medication , please advise  oxyCODONE-acetaminophen (PERCOCET) 5-325 MG tablet

## 2021-08-02 NOTE — Telephone Encounter (Signed)
Left a voice message for patient to inform her of medication called in on yesterday. ?

## 2021-08-14 ENCOUNTER — Telehealth: Payer: Self-pay

## 2021-08-14 NOTE — Telephone Encounter (Signed)
Patient called the office requesting a refill of pain medication. ? ? ? ?Please advise  ?

## 2021-08-15 ENCOUNTER — Telehealth: Payer: Self-pay | Admitting: *Deleted

## 2021-08-15 ENCOUNTER — Other Ambulatory Visit: Payer: Self-pay | Admitting: Podiatry

## 2021-08-15 MED ORDER — ACETAMINOPHEN-CODEINE #3 300-30 MG PO TABS: 1.0000 | ORAL_TABLET | ORAL | 0 refills | Status: DC | PRN

## 2021-08-15 NOTE — Telephone Encounter (Signed)
Called patient, no answer, left vmessage that tylenol # 3 has been had been sent to pharmacy instead of percocet, not a surgery patient and he cannot prescribe. ?

## 2021-08-15 NOTE — Telephone Encounter (Signed)
Patient is calling to request a pain medication refill. Please advise. ?

## 2021-09-01 ENCOUNTER — Encounter: Payer: Self-pay | Admitting: Physical Medicine and Rehabilitation

## 2021-09-01 ENCOUNTER — Encounter: Payer: 59 | Attending: Physical Medicine and Rehabilitation | Admitting: Physical Medicine and Rehabilitation

## 2021-09-01 ENCOUNTER — Ambulatory Visit (INDEPENDENT_AMBULATORY_CARE_PROVIDER_SITE_OTHER): Payer: 59 | Admitting: Podiatry

## 2021-09-01 ENCOUNTER — Telehealth: Payer: Self-pay | Admitting: Podiatry

## 2021-09-01 VITALS — BP 150/85 | HR 65 | Ht 62.0 in | Wt 191.4 lb

## 2021-09-01 DIAGNOSIS — L03031 Cellulitis of right toe: Secondary | ICD-10-CM | POA: Diagnosis not present

## 2021-09-01 DIAGNOSIS — M545 Low back pain, unspecified: Secondary | ICD-10-CM | POA: Diagnosis not present

## 2021-09-01 DIAGNOSIS — G894 Chronic pain syndrome: Secondary | ICD-10-CM | POA: Diagnosis not present

## 2021-09-01 DIAGNOSIS — G8929 Other chronic pain: Secondary | ICD-10-CM | POA: Diagnosis not present

## 2021-09-01 DIAGNOSIS — F32A Depression, unspecified: Secondary | ICD-10-CM | POA: Insufficient documentation

## 2021-09-01 MED ORDER — OXYCODONE-ACETAMINOPHEN 5-325 MG PO TABS: 1.0000 | ORAL_TABLET | ORAL | 0 refills | Status: DC | PRN

## 2021-09-01 MED ORDER — DULOXETINE HCL 60 MG PO CPEP: 120.0000 mg | ORAL_CAPSULE | Freq: Every day | ORAL | 5 refills | Status: DC

## 2021-09-01 MED ORDER — BUSPIRONE HCL 5 MG PO TABS: 5.0000 mg | ORAL_TABLET | Freq: Three times a day (TID) | ORAL | 5 refills | Status: DC

## 2021-09-01 NOTE — Telephone Encounter (Signed)
Patient is calling to have physician resend prescription to [facility] on [address],out of medication at the [facility] location. Please advise. ?

## 2021-09-01 NOTE — Telephone Encounter (Signed)
Notified pt that the pain medication was sent to the [facility] [address] [address] ?

## 2021-09-01 NOTE — Telephone Encounter (Signed)
Pt called and went to pick up her pain medication at the [facility] on [address] and they do not have it in stock,. They told her to send it to [facility] on [address]  ?

## 2021-09-01 NOTE — Patient Instructions (Signed)
Pt is a 66 yr old female with hx of HTN; hypothyroidism;  ?Here for evaluation of chronic low back pain, L knee and L hip pain.  ?Hx of kidney and gall stones in last year.  ?Here for f/u on chronic pain. ? ? ?[facility] to another [facility]- and insist to use [facility]- or use $4 list at [employer]-  Will start Buspar 5 mg TID/3x/day for anxiety- and then see if need to go up on it after 1 month- checked [facility] website- on $4 list.  ?2. Will increase Duloxetine to 120 mg daily- for nerve pain- [facility] to [facility]- 5 refills sent ? ? ?3. Con't Diclofenac 75 mg BID as needed.  ? ?4. F/U in 3 months- call me in 1 month to let me know how things going- so we can change meds up at that time. ?

## 2021-09-01 NOTE — Progress Notes (Signed)
? ?Subjective:  ? ? Patient ID: Wendy Carr, female    DOB: [birth year], 66 y.o.   MRN: [id number] ? ?HPI ?Pt is a 66 yr old female with hx of HTN; hypothyroidism;  ?Here for evaluation of chronic low back pain, L knee and L hip pain. Hx of polysubstance use- including cocaine.  ?Hx of kidney and gall stones in last year.  ?Here for f/u on chronic pain ? ?Pain is a little better than it was- with Diclofenac and Duloxetine.  ? ?No side effects on either- doesn't help "tons" but helps a little.  ? ?Didn't get Buspar because had to pay for it- doesn't know how much. Was told couldn't use Good rx and insurance Which is not correct.  ? ?Doing well on saying clean-  ?One day at a time- praying. To keep clean.  ? ?Taking Diclofenac 2x/day- every day.  ? ?Went to [facility]- removed clalus and big toe still hurt-s they are working on that- [employer] recommended.  ? ? ? ?Pain Inventory ?Average Pain 9 ?Pain Right Now 9 ?My pain is intermittent and aching ? ?In the last 24 hours, has pain interfered with the following? ?General activity 8 ?Relation with others 9 ?Enjoyment of life 9 ?What TIME of day is your pain at its worst? morning  and night ?Sleep (in general) Poor ? ?Pain is worse with: bending and some activites ?Pain improves with: medication ?Relief from Meds: 6 ? ?Family History  ?Problem Relation Age of Onset  ? Hypertension Mother   ? Diabetes Sister   ? Arthritis Other   ? Diabetes Other   ? Pancreatic cancer Maternal Uncle   ? Diabetes Maternal Grandmother   ? Ovarian cancer Maternal Grandmother   ? Colon cancer Maternal Grandmother   ? Esophageal cancer Neg Hx   ? Stomach cancer Neg Hx   ? Rectal cancer Neg Hx   ? ?Social History  ? ?Socioeconomic History  ? Marital status: Married  ?  Spouse name: Not on file  ? Number of children: 0  ? Years of education: 46  ? Highest education level: Not on file  ?Occupational History  ? Not on file  ?Tobacco Use  ? Smoking status: Every Day  ?  Packs/day: 0.40  ?   Years: 7.00  ?  Pack years: 2.80  ?  Types: Cigarettes  ? Smokeless tobacco: Never  ? Tobacco comments:  ?  Married 2009-lives with spouse, no kids  ?Vaping Use  ? Vaping Use: Every day  ?Substance and Sexual Activity  ? Alcohol use: No  ?  Alcohol/week: 0.0 standard drinks  ? Drug use: Yes  ?  Types: Cocaine  ?  Comment: Last time usage was December 2016 - heroin and cocaine  ? Sexual activity: Yes  ?  Partners: Male  ?  Birth control/protection: Abstinence  ?Other Topics Concern  ? Not on file  ?Social History Narrative  ? Denies abuse and feels safe at home.   ? ?Social Determinants of Health  ? ?Financial Resource Strain: Not on file  ?Food Insecurity: Not on file  ?Transportation Needs: Not on file  ?Physical Activity: Not on file  ?Stress: Not on file  ?Social Connections: Not on file  ? ?Past Surgical History:  ?Procedure Laterality Date  ? BREAST SURGERY  2004  ? Lumpectomy, right side-Benign  ? COLONOSCOPY    ? DILATION AND CURETTAGE OF UTERUS    ? FOOT SURGERY  2006  ? right  ?  HERNIA REPAIR  1995  ? [facility] ?[place]  ? MYOMECTOMY  1997  ? THYROIDECTOMY, PARTIAL  2004  ? Left, benign  ? [place]  ? left arm  ? ?Past Surgical History:  ?Procedure Laterality Date  ? BREAST SURGERY  2004  ? Lumpectomy, right side-Benign  ? COLONOSCOPY    ? DILATION AND CURETTAGE OF UTERUS    ? FOOT SURGERY  2006  ? right  ? HERNIA REPAIR  1995  ? [facility] ?[place]  ? MYOMECTOMY  1997  ? THYROIDECTOMY, PARTIAL  2004  ? Left, benign  ? [place]  ? left arm  ? ?Past Medical History:  ?Diagnosis Date  ? ALLERGIC RHINITIS   ? Allergy   ? ANEMIA-NOS   ? ANXIETY   ? Arthritis   ? ASTHMA   ? Blood transfusion without reported diagnosis   ? GERD   ? HYPERGLYCEMIA, BORDERLINE   ? HYPERTENSION   ? HYPOTHYROIDISM   ? Personal history of colonic polyps 2011  ? last colonscopy 2011: no polyps.  ? ?There were no vitals taken for this visit. ? ?Opioid Risk Score:   ?Fall Risk Score:  `1 ? ?Depression screen PHQ 2/9 ? ? ?   05/31/2021  ? 10:55 AM 06/19/2017  ?  9:21 AM 03/13/2016  ?  5:17 PM 07/07/2015  ? 10:11 AM 06/05/2015  ? 12:34 PM 06/01/2015  ? 10:56 AM 05/25/2015  ?  8:50 AM  ?Depression screen PHQ 2/9  ?Decreased Interest 1 2 0 0 0 0 0  ?Down, Depressed, Hopeless 1 2 0 0 0 0 0  ?PHQ - 2 Score 2 4 0 0 0 0 0  ?Altered sleeping 1 3       ?Tired, decreased energy 1 2       ?Change in appetite 2 2       ?Feeling bad or failure about yourself  2 2       ?Trouble concentrating 1 2       ?Moving slowly or fidgety/restless 2 2       ?Suicidal thoughts 1 0       ?PHQ-9 Score 12 17       ?Difficult doing work/chores Somewhat difficult        ?  ?Review of Systems  ?Musculoskeletal:  Positive for back pain and gait problem.  ?     Pain in both knees, pain on both sides around to  back sides, right ankle pain  ?All other systems reviewed and are negative. ? ?   ?Objective:  ? Physical Exam ? ?Awake, alert, more depressed/anxious affect; accompanied by family member, NAD ? ? ?   ?Assessment & Plan:  ? ?Pt is a 66 yr old female with hx of HTN; hypothyroidism;  ?Here for evaluation of chronic low back pain, L knee and L hip pain.  ?Hx of kidney and gall stones in last year.  ?Here for f/u on chronic pain. ? ? ?[facility]- and insist to use [facility]- or use $4 list at [employer]-  Will start Buspar 5 mg TID/3x/day for anxiety- and then see if need to go up on it after 1 month- checked [facility] website- on $4 list.  ?2. Will increase Duloxetine to 120 mg daily- for nerve pain- [facility]- [facility] refills sent ? ? ?3. Con't Diclofenac 75 mg BID as needed.  ? ?4. F/U in 3 months- call me in 1 month to  let me know how things going- so we can change meds up at that time.  ?

## 2021-09-05 ENCOUNTER — Ambulatory Visit (INDEPENDENT_AMBULATORY_CARE_PROVIDER_SITE_OTHER): Payer: Self-pay | Admitting: Podiatry

## 2021-09-05 DIAGNOSIS — Z91199 Patient's noncompliance with other medical treatment and regimen due to unspecified reason: Secondary | ICD-10-CM

## 2021-09-05 NOTE — Progress Notes (Signed)
   Complete physical exam  Patient: Wendy Carr   DOB: 02/24/1997   66 y.o. Female  MRN: [id number]  Subjective:    No chief complaint on file.   Wendy Carr is a 66 y.o. female who presents today for a complete physical exam. She reports consuming a {diet types:17450} diet. {types:19826} She generally feels {DESC; WELL/FAIRLY WELL/POORLY:18703}. She reports sleeping {DESC; WELL/FAIRLY WELL/POORLY:18703}. She {does/does not:200015} have additional problems to discuss today.    Most recent fall risk assessment:    11/01/2021   10:42 AM  Fall Risk   Falls in the past year? 0  Number falls in past yr: 0  Injury with Fall? 0  Risk for fall due to : No Fall Risks  Follow up Falls evaluation completed     Most recent depression screenings:    11/01/2021   10:42 AM 09/22/2020   10:46 AM  PHQ 2/9 Scores  PHQ - 2 Score 0 0  PHQ- 9 Score 5     {VISON DENTAL STD PSA (Optional):27386}  {History (Optional):23778}  Patient Care Team: Jessup, Joy, NP as PCP - General (Nurse Practitioner)   Outpatient Medications Prior to Visit  Medication Sig   fluticasone (FLONASE) 50 MCG/ACT nasal spray Place 2 sprays into both nostrils in the morning and at bedtime. After 7 days, reduce to once daily.   norgestimate-ethinyl estradiol (SPRINTEC 28) 0.25-35 MG-MCG tablet Take 1 tablet by mouth daily.   Nystatin POWD Apply liberally to affected area 2 times per day   spironolactone (ALDACTONE) 100 MG tablet Take 1 tablet (100 mg total) by mouth daily.   No facility-administered medications prior to visit.    ROS        Objective:     There were no vitals taken for this visit. {Vitals History (Optional):23777}  Physical Exam   No results found for any visits on 12/07/21. {Show previous labs (optional):23779}    Assessment & Plan:    Routine Health Maintenance and Physical Exam  Immunization History  Administered Date(s) Administered   DTaP 05/10/1999, 07/06/1999,  09/14/1999, 05/30/2000, 12/14/2003   Hepatitis A 10/10/2007, 10/15/2008   Hepatitis B 02/25/1999, 04/04/1999, 09/14/1999   HiB (PRP-OMP) 05/10/1999, 07/06/1999, 09/14/1999, 05/30/2000   IPV 05/10/1999, 07/06/1999, 03/04/2000, 12/14/2003   Influenza,inj,Quad PF,6+ Mos 01/15/2014   Influenza-Unspecified 04/16/2012   MMR 03/04/2001, 12/14/2003   Meningococcal Polysaccharide 10/15/2011   Pneumococcal Conjugate-13 05/30/2000   Pneumococcal-Unspecified 09/14/1999, 11/28/1999   Tdap 10/15/2011   Varicella 03/04/2000, 10/10/2007    Health Maintenance  Topic Date Due   HIV Screening  Never done   Hepatitis C Screening  Never done   INFLUENZA VACCINE  12/05/2021   PAP-Cervical Cytology Screening  12/07/2021 (Originally 02/24/2020)   PAP SMEAR-Modifier  12/07/2021 (Originally 02/24/2020)   TETANUS/TDAP  12/07/2021 (Originally 10/14/2021)   HPV VACCINES  Discontinued   COVID-19 Vaccine  Discontinued    Discussed health benefits of physical activity, and encouraged her to engage in regular exercise appropriate for her age and condition.  Problem List Items Addressed This Visit   None Visit Diagnoses     Annual physical exam    -  Primary   Cervical cancer screening       Need for Tdap vaccination          No follow-ups on file.     Joy Jessup, NP   

## 2021-09-08 NOTE — Progress Notes (Signed)
?Subjective:  ?Patient ID: Wendy Carr, female    DOB: 22-Jan-1956,  MRN: [id number] ? ?Chief Complaint  ?Patient presents with  ? Toe Pain  ? ? ?66 y.o. female presents with the above complaint.  Patient presents with right hallux paronychia.  She states she is doing a lot better.  The redness has gone away.  Occasionally she has pain at nighttime but overall doing much better ? ? ?Review of Systems: Negative except as noted in the HPI. Denies N/V/F/Ch. ? ?Past Medical History:  ?Diagnosis Date  ? ALLERGIC RHINITIS   ? Allergy   ? ANEMIA-NOS   ? ANXIETY   ? Arthritis   ? ASTHMA   ? Blood transfusion without reported diagnosis   ? GERD   ? HYPERGLYCEMIA, BORDERLINE   ? HYPERTENSION   ? HYPOTHYROIDISM   ? Personal history of colonic polyps 2011  ? last colonscopy 2011: no polyps.  ? ? ?Current Outpatient Medications:  ?  albuterol (PROVENTIL HFA;VENTOLIN HFA) 108 (90 Base) MCG/ACT inhaler, Inhale 1-2 puffs into the lungs every 6 (six) hours as needed for wheezing or shortness of breath., Disp: 1 Inhaler, Rfl: 2 ?  busPIRone (BUSPAR) 5 MG tablet, Take 1 tablet (5 mg total) by mouth 3 (three) times daily., Disp: 90 tablet, Rfl: 5 ?  COVID-19 mRNA bivalent vaccine, Pfizer, (PFIZER COVID-19 VAC BIVALENT) injection, Inject into the muscle., Disp: 0.3 mL, Rfl: 0 ?  diclofenac (VOLTAREN) 75 MG EC tablet, Take 1 tablet (75 mg total) by mouth 2 (two) times daily., Disp: 60 tablet, Rfl: 5 ?  DULoxetine (CYMBALTA) 60 MG capsule, Take 2 capsules (120 mg total) by mouth daily. For nerve pain, Disp: 60 capsule, Rfl: 5 ?  losartan-hydrochlorothiazide (HYZAAR) 100-25 MG tablet, TAKE 1 TABLET BY MOUTH DAILY, Disp: 90 tablet, Rfl: 1 ?  Multiple Vitamin (MULTIVITAMIN) capsule, Take 1 capsule by mouth daily., Disp: , Rfl:  ?  oxyCODONE-acetaminophen (PERCOCET) 5-325 MG tablet, Take 1 tablet by mouth every 4 (four) hours as needed for severe pain., Disp: 30 tablet, Rfl: 0 ?  oxyCODONE-acetaminophen (PERCOCET) 5-325 MG tablet, Take 1  tablet by mouth every 4 (four) hours as needed for severe pain., Disp: 30 tablet, Rfl: 0 ? ?Social History  ? ?Tobacco Use  ?Smoking Status Every Day  ? Packs/day: 0.40  ? Years: 7.00  ? Pack years: 2.80  ? Types: Cigarettes  ?Smokeless Tobacco Never  ?Tobacco Comments  ? Married 2009-lives with spouse, no kids  ? ? ?Allergies  ?Allergen Reactions  ? Naproxen Other (See Comments)  ?  Stomach cramps ?  ? ?Objective:  ?There were no vitals filed for this visit. ?There is no height or weight on file to calculate BMI. ?Constitutional Well developed. ?Well nourished.  ?Vascular Dorsalis pedis pulses palpable bilaterally. ?Posterior tibial pulses palpable bilaterally. ?Capillary refill normal to all digits.  ?No cyanosis or clubbing noted. ?Pedal hair growth normal.  ?Neurologic Normal speech. ?Oriented to person, place, and time. ?Epicritic sensation to light touch grossly present bilaterally.  ?Dermatologic Right hallux no further paronychia noted no ingrown noted this is improving.  No further redness noted to the proximal nail fold.  Mild pain on palpation.  No area of fluctuance open wound noted.  ?Orthopedic: Normal joint ROM without pain or crepitus bilaterally. ?No visible deformities. ?No bony tenderness.  ? ?Radiographs: None ?Assessment:  ? ?No diagnosis found. ? ?Plan:  ?Patient was evaluated and treated and all questions answered. ? ?Right hallux paronychia ?-All questions and concerns were  discussed with the patient in extensive detail  ?-Clinically healed and no further signs of infection noted.  I discussed with her that if her ingrown/paronychia comes back or recurs come see me and I will help her get rid of it.  She states understanding. ? ?No follow-ups on file. ?

## 2021-09-18 ENCOUNTER — Telehealth: Payer: Self-pay | Admitting: *Deleted

## 2021-09-18 NOTE — Telephone Encounter (Signed)
Patient notified

## 2021-09-18 NOTE — Telephone Encounter (Signed)
Patient is calling for pain medicine refill. Please advise. ?

## 2021-10-03 ENCOUNTER — Telehealth: Payer: Self-pay | Admitting: *Deleted

## 2021-10-03 MED ORDER — OXYCODONE-ACETAMINOPHEN 5-325 MG PO TABS: 1.0000 | ORAL_TABLET | ORAL | 0 refills | Status: DC | PRN

## 2021-10-03 NOTE — Telephone Encounter (Signed)
Called patient to inform that a pain medication has been sent to pharmacy on file, no answer, could not leave voice message , mailbox full.

## 2021-10-03 NOTE — Telephone Encounter (Signed)
Patient is calling for a medication refill of oxycodone-ace, 5-325 mg,foot is hurting really bad due to the rain. Please advise.

## 2021-10-18 ENCOUNTER — Telehealth: Payer: Self-pay | Admitting: *Deleted

## 2021-10-18 NOTE — Telephone Encounter (Signed)
Patient requesting a pain medication refill of oxycodone. Please advise.

## 2021-10-19 NOTE — Telephone Encounter (Signed)
Patient has been notified thru voice message of physician's decision on pain medicine.

## 2021-10-26 ENCOUNTER — Ambulatory Visit (INDEPENDENT_AMBULATORY_CARE_PROVIDER_SITE_OTHER): Payer: 59 | Admitting: Podiatry

## 2021-10-26 DIAGNOSIS — Q828 Other specified congenital malformations of skin: Secondary | ICD-10-CM | POA: Diagnosis not present

## 2021-10-26 MED ORDER — OXYCODONE-ACETAMINOPHEN 5-325 MG PO TABS: 1.0000 | ORAL_TABLET | ORAL | 0 refills | Status: DC | PRN

## 2021-11-08 ENCOUNTER — Telehealth: Payer: Self-pay | Admitting: Podiatry

## 2021-11-08 NOTE — Telephone Encounter (Signed)
"  I'm calling to see if I can get a refill of my Oxycodone, for my foot pain."

## 2021-11-09 ENCOUNTER — Other Ambulatory Visit (HOSPITAL_COMMUNITY): Payer: Self-pay

## 2021-11-09 MED ORDER — OXYCODONE-ACETAMINOPHEN 5-325 MG PO TABS: 1.0000 | ORAL_TABLET | ORAL | 0 refills | Status: DC | PRN

## 2021-11-09 NOTE — Addendum Note (Signed)
Addended by: Boneta Lucks on: 11/09/2021 06:39 AM   Modules accepted: Orders

## 2021-11-22 ENCOUNTER — Telehealth: Payer: Self-pay | Admitting: Podiatry

## 2021-11-22 MED ORDER — OXYCODONE-ACETAMINOPHEN 5-325 MG PO TABS: 1.0000 | ORAL_TABLET | ORAL | 0 refills | Status: DC | PRN

## 2021-11-22 NOTE — Telephone Encounter (Signed)
Patient called requesting refill on oxyCODONE-acetaminophen (PERCOCET) 5-325 MG tablet  Please advise.

## 2021-11-22 NOTE — Addendum Note (Signed)
Addended by: Boneta Lucks on: 11/22/2021 02:56 PM   Modules accepted: Orders

## 2021-12-06 ENCOUNTER — Telehealth: Payer: Self-pay | Admitting: *Deleted

## 2021-12-06 NOTE — Telephone Encounter (Signed)
Patient is calling for a medication refill on oxycodone-ace,5/325 mg. Please advise.

## 2021-12-07 MED ORDER — OXYCODONE-ACETAMINOPHEN 5-325 MG PO TABS: 1.0000 | ORAL_TABLET | ORAL | 0 refills | Status: AC | PRN

## 2021-12-08 ENCOUNTER — Encounter: Payer: 59 | Attending: Physical Medicine and Rehabilitation | Admitting: Physical Medicine and Rehabilitation

## 2021-12-19 ENCOUNTER — Other Ambulatory Visit: Payer: Self-pay

## 2021-12-19 ENCOUNTER — Telehealth: Payer: Self-pay | Admitting: Podiatry

## 2021-12-19 DIAGNOSIS — G8929 Other chronic pain: Secondary | ICD-10-CM

## 2021-12-19 DIAGNOSIS — M216X9 Other acquired deformities of unspecified foot: Secondary | ICD-10-CM

## 2021-12-19 MED ORDER — OXYCODONE-ACETAMINOPHEN 5-325 MG PO TABS: 1.0000 | ORAL_TABLET | ORAL | 0 refills | Status: DC | PRN

## 2021-12-19 NOTE — Telephone Encounter (Signed)
What is the DX code

## 2021-12-19 NOTE — Telephone Encounter (Signed)
Pt calling asking for her pain medication ( oxycondone-ace 5/'325mg'$ to be called in asap. She stated she called yesterday and left message.

## 2021-12-19 NOTE — Telephone Encounter (Signed)
Done

## 2021-12-29 ENCOUNTER — Other Ambulatory Visit: Payer: Self-pay | Admitting: Emergency Medicine

## 2021-12-29 DIAGNOSIS — I10 Essential (primary) hypertension: Secondary | ICD-10-CM

## 2022-01-02 ENCOUNTER — Other Ambulatory Visit: Payer: Self-pay

## 2022-01-02 NOTE — Telephone Encounter (Signed)
Patient is calling back, she said that she is trying to get in for a pain management appointment but needs something for pain (callus)now. Explained that she should also schedule an appointment for reevaluation of the callouses, said that she would, transferred to [facility]: [facility] for scheduling.

## 2022-01-03 ENCOUNTER — Telehealth: Payer: Self-pay | Admitting: *Deleted

## 2022-01-03 MED ORDER — OXYCODONE-ACETAMINOPHEN 5-325 MG PO TABS: 1.0000 | ORAL_TABLET | ORAL | 0 refills | Status: DC | PRN

## 2022-01-03 NOTE — Telephone Encounter (Signed)
Patient has been scheduled for pain management 01/05/22,but she is requesting something for her foot pain, unable to sleep especially at night. She wants only a few more pills until she is able to be seen by their office. Please advise.

## 2022-01-04 ENCOUNTER — Other Ambulatory Visit: Payer: Self-pay | Admitting: Emergency Medicine

## 2022-01-04 DIAGNOSIS — I10 Essential (primary) hypertension: Secondary | ICD-10-CM

## 2022-01-05 ENCOUNTER — Telehealth: Payer: Self-pay | Admitting: Physical Medicine and Rehabilitation

## 2022-01-05 ENCOUNTER — Encounter: Payer: Self-pay | Admitting: Physical Medicine and Rehabilitation

## 2022-01-05 ENCOUNTER — Encounter
Payer: Commercial Managed Care - HMO | Attending: Physical Medicine and Rehabilitation | Admitting: Physical Medicine and Rehabilitation

## 2022-01-05 VITALS — BP 165/83 | HR 87 | Ht 62.0 in | Wt 187.2 lb

## 2022-01-05 DIAGNOSIS — Z5181 Encounter for therapeutic drug level monitoring: Secondary | ICD-10-CM | POA: Diagnosis present

## 2022-01-05 DIAGNOSIS — M25562 Pain in left knee: Secondary | ICD-10-CM | POA: Diagnosis present

## 2022-01-05 DIAGNOSIS — G8929 Other chronic pain: Secondary | ICD-10-CM | POA: Diagnosis present

## 2022-01-05 DIAGNOSIS — Z79891 Long term (current) use of opiate analgesic: Secondary | ICD-10-CM | POA: Insufficient documentation

## 2022-01-05 DIAGNOSIS — G894 Chronic pain syndrome: Secondary | ICD-10-CM | POA: Diagnosis present

## 2022-01-05 MED ORDER — TRAMADOL HCL 50 MG PO TABS: 50.0000 mg | ORAL_TABLET | Freq: Three times a day (TID) | ORAL | 0 refills | Status: DC | PRN

## 2022-01-05 MED ORDER — BUSPIRONE HCL 5 MG PO TABS: 5.0000 mg | ORAL_TABLET | Freq: Three times a day (TID) | ORAL | 5 refills | Status: AC

## 2022-01-05 MED ORDER — DULOXETINE HCL 60 MG PO CPEP: 120.0000 mg | ORAL_CAPSULE | Freq: Every day | ORAL | 5 refills | Status: AC

## 2022-01-05 MED ORDER — TRAZODONE HCL 150 MG PO TABS: 150.0000 mg | ORAL_TABLET | Freq: Every day | ORAL | 5 refills | Status: AC

## 2022-01-05 NOTE — Progress Notes (Signed)
Subjective:    Patient ID: Wendy Carr, female    DOB: 1955-11-05, 66 y.o.   MRN: [id number]  HPI Pt is a 66 yr old female with hx of HTN; hypothyroidism;  Here for evaluation of chronic low back pain, L knee and L hip pain.  Hx of kidney and gall stones in last year.  Here for f/u on chronic pain.    Still has calluses on B/L feet- now on L foot-  Foot  was better after a callus was removed on R foot.    Main thing that's hurting in L knee- wondering if could have cortisone shot.  Tried diclofenac for L knee in January-  Also tried warm compresses and elevation and  Having poor sleep- used to take Trazodone.  Husband has cancer and trying to sleep.-   Has a knee brace- has a really big knee brace No improvement with tyneol and ibuprofen.   Never had kidney issues.   In 2021, has L knee xrays- but had mild OA at the time.    Is taking the anxiety- helps the anxiety - loves the Buspar.   Is still taking Duloxetine for nerve pain.   Also fell on L knee 3 weeks ago- maybe 4 weeks ago and fell right on patella. Real painful ever since.   Pain Inventory Average Pain 8 Pain Right Now 8 My pain is sharp, burning, and aching  In the last 24 hours, has pain interfered with the following? General activity 7 Relation with others 8 Enjoyment of life 9 What TIME of day is your pain at its worst? morning , daytime, and night Sleep (in general) Poor  Pain is worse with: walking, standing, and some activites Pain improves with: rest, therapy/exercise, pacing activities, and medication Relief from Meds: 9  Family History  Problem Relation Age of Onset   Hypertension Mother    Diabetes Sister    Arthritis Other    Diabetes Other    Pancreatic cancer Maternal Uncle    Diabetes Maternal Grandmother    Ovarian cancer Maternal Grandmother    Colon cancer Maternal Grandmother    Esophageal cancer Neg Hx    Stomach cancer Neg Hx    Rectal cancer Neg Hx    Social  History   Socioeconomic History   Marital status: Married    Spouse name: Not on file   Number of children: 0   Years of education: 16   Highest education level: Not on file  Occupational History   Not on file  Tobacco Use   Smoking status: Every Day    Packs/day: 0.40    Years: 7.00    Total pack years: 2.80    Types: Cigarettes   Smokeless tobacco: Never   Tobacco comments:    Married 2009-lives with spouse, no kids  Vaping Use   Vaping Use: Every day   Substances: Flavoring  Substance and Sexual Activity   Alcohol use: No    Alcohol/week: 0.0 standard drinks of alcohol   Drug use: Yes    Types: Cocaine    Comment: 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    Social Determinants of Health   Financial Resource Strain: Not on file  Food Insecurity: Not on file  Transportation Needs: Not on file  Physical Activity: Not on file  Stress: Not on file  Social Connections: Not on file   Past Surgical History:  Procedure Laterality Date   BREAST SURGERY  2004   Lumpectomy, right side-Benign   COLONOSCOPY     DILATION AND CURETTAGE OF UTERUS     FOOT SURGERY  2006   right   [place] [facility]   THYROIDECTOMY, PARTIAL  2004   Left, benign   VEIN SURGERY  1997   left arm   Past Surgical History:  Procedure Laterality Date   BREAST SURGERY  2004   Lumpectomy, right side-Benign   COLONOSCOPY     DILATION AND CURETTAGE OF UTERUS     FOOT SURGERY  2006   right   [facility] ?[place]   THYROIDECTOMY, PARTIAL  2004   Left, benign   VEIN SURGERY  1997   left arm   Past Medical History:  Diagnosis Date   ALLERGIC RHINITIS    Allergy    ANEMIA-NOS    ANXIETY    Arthritis    ASTHMA     Blood transfusion without reported diagnosis    GERD    HYPERGLYCEMIA, BORDERLINE    HYPERTENSION    HYPOTHYROIDISM    Personal history of colonic polyps 2011   last colonscopy 2011: no polyps.   BP (!) 165/83   Pulse 87   Ht '5\' 2"'$  (1.575 m)   Wt 187 lb 3.2 oz (84.9 kg)   SpO2 97%   BMI 34.24 kg/m   Opioid Risk Score:   Fall Risk Score:  `1  Depression screen [facility] 2/9     09/01/2021    9:03 AM 05/31/2021   10:55 AM 06/19/2017    9:21 AM 03/13/2016    5:17 PM 07/07/2015   10:11 AM 06/05/2015   12:34 PM 06/01/2015   10:56 AM  Depression screen PHQ 2/9  Decreased Interest 0 1 2 0 0 0 0  Down, Depressed, Hopeless 0 1 2 0 0 0 0  PHQ - 2 Score 0 2 4 0 0 0 0  Altered sleeping  1 3      Tired, decreased energy  1 2      Change in appetite  2 2      Feeling bad or failure about yourself   2 2      Trouble concentrating  1 2      Moving slowly or fidgety/restless  2 2      Suicidal thoughts  1 0      PHQ-9 Score  12 17      Difficult doing work/chores  Somewhat difficult          Review of Systems  Musculoskeletal:        Right knee pain Bilateral foot pain  All other systems reviewed and are negative.     Objective:   Physical Exam  Awake, alert, appropriate, sitting on table; NAD Mild to moderate effusion of L knee TTP with bruising over patella and also swelling around patella as well.       Assessment & Plan:   Pt is a 66 yr old female with hx of HTN; hypothyroidism;  Here for evaluation of chronic low back pain, L knee and L hip pain.  Hx of kidney and gall stones in last year.  Here  for f/u on chronic pain.    L knee swelling and pain- pt asking for steroid injection- however cannot get in for this in my clinic for weeks to do so- so will refer to Ortho  2. Referral to Ortho for L knee steroid injection and evaluation for swelling/effusion   3. Will start Trazodone 150 mg QHS for sleep- nightly-  for sleep- pt has taken this dose before with good results.     4. Trying to get work, but L knee is impacting the ability to do so.   5.  Non hinged- over Hinged knee brace- Neoprene- so it breaths- and hole for knee cap and some padding around knee cap hole.  Takes a week to get insurance to cover. So let me know if want me to give Rx- have decided not for now.    6. 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    7.  Needs to make f/u appointment with Podiatry for calluses- helps to have them removed.    8. UDS and opiate contract  9. Will try Tramadol- 50 mg 3x/day as needed- # 21-if UDS (-), which needs to be done in 24 hours, then will prescribe 1 month at a time.    10. F/U in 31month- on pain   I spent a total of  33  minutes on total care today- >50% coordination of care- due to discussion of tramadol-

## 2022-01-05 NOTE — Patient Instructions (Signed)
Pt is a 65 yr old female with hx of HTN; hypothyroidism;  Here for evaluation of chronic low back pain, L knee and L hip pain.  Hx of kidney and gall stones in last year.  Here for f/u on chronic pain.    L knee swelling and pain- pt asking for steroid injection- however cannot get in for this in my clinic for weeks to do so- so will refer to Ortho  2. Referral to Ortho for L knee steroid injection and evaluation for swelling/effusion   3. Will start Trazodone 150 mg QHS for sleep- nightly-  for sleep- pt has taken this dose before with good results.    4. Trying to get work, but L knee is impacting the ability to do so.   5.  Non hinged- over Hinged knee brace- Neoprene- so it breaths- and hole for knee cap and some padding around knee cap hole.  Takes a week to get insurance to cover. So let me know if want me to give Rx- have decided not for now.    6. 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    7.  Needs to make f/u appointment with Podiatry for calluses- helps to have them removed.    8. UDS and opiate contract  9. Will try Tramadol- 50 mg 3x/day as needed- # 21-if UDS (-), which needs to be done in 24 hours, then will prescribe 1 month at a time.    10. F/U in 2month- on pain

## 2022-01-05 NOTE — Telephone Encounter (Signed)
Left patient a voicemail to schedule :L knee swelling and pain- pt asking for steroid injection- however cannot get in for this in my clinic for weeks to do so- with Dr. Tressa Busman.

## 2022-01-12 ENCOUNTER — Telehealth: Payer: Self-pay | Admitting: *Deleted

## 2022-01-12 ENCOUNTER — Other Ambulatory Visit: Payer: Self-pay | Admitting: Physical Medicine and Rehabilitation

## 2022-01-12 LAB — TOXASSURE SELECT,+ANTIDEPR,UR

## 2022-01-12 NOTE — Telephone Encounter (Signed)
Wendy Carr has called 3 x today about her urine drug screen and requesting medication.

## 2022-01-12 NOTE — Telephone Encounter (Signed)
Urine drug screen for this encounter is NEGATIVE for controlled medication or metabolites. It is hard to determine if this is expected since there is no pill count or question asked about medication taken before test. The results show no drugs present but the creatinine level is very low and abnormal range. Looking back at past drug screens it has been in a normal range. She was prescribed oxycodone acetaminophen by her podiatrist and last Rx per PMP was filled 01/03/22 #15 tablets and should have metabolite if taken as prescribed (2 day supply). Therefore, taking drug history into consideration, I would suggest retesting at next visit using oral swab.

## 2022-01-15 ENCOUNTER — Encounter: Payer: Commercial Managed Care - HMO | Admitting: Physical Medicine and Rehabilitation

## 2022-01-15 ENCOUNTER — Encounter: Payer: Self-pay | Admitting: Physical Medicine and Rehabilitation

## 2022-01-15 VITALS — BP 161/85 | HR 83 | Ht 62.0 in | Wt 187.0 lb

## 2022-01-15 DIAGNOSIS — Z5181 Encounter for therapeutic drug level monitoring: Secondary | ICD-10-CM

## 2022-01-15 DIAGNOSIS — M25562 Pain in left knee: Secondary | ICD-10-CM | POA: Diagnosis not present

## 2022-01-15 DIAGNOSIS — Z79891 Long term (current) use of opiate analgesic: Secondary | ICD-10-CM | POA: Diagnosis not present

## 2022-01-15 DIAGNOSIS — G8929 Other chronic pain: Secondary | ICD-10-CM | POA: Insufficient documentation

## 2022-01-15 DIAGNOSIS — G894 Chronic pain syndrome: Secondary | ICD-10-CM | POA: Diagnosis not present

## 2022-01-15 MED ORDER — TRIAMCINOLONE ACETONIDE 40 MG/ML IJ SUSP: 40.0000 mg | Freq: Once | INTRAMUSCULAR | Status: AC

## 2022-01-15 MED ORDER — LIDOCAINE HCL (PF) 1 % IJ SOLN
3.0000 mL | Freq: Once | INTRAMUSCULAR | Status: AC
Start: 2022-01-15 — End: ?

## 2022-01-15 NOTE — Patient Instructions (Signed)
Follow up with Dr. Dagoberto Ligas as needed.

## 2022-01-15 NOTE — Addendum Note (Signed)
Addended by: Durel Salts on: 01/15/2022 10:09 PM   Modules accepted: Orders

## 2022-01-15 NOTE — Progress Notes (Signed)
HPI: Mrs. Mccandlish is a 66 y/o F presenting with Hx hypothyroidism, chronic pain, cholelithiasis, kidney stones, and recent fall 3 months prior w/ R elbow pain and left knee pain, presenting as a referral from Dr. Dagoberto Ligas for L knee injection.  Patient denies any medical changes since last seen in clinic by Dr. Dagoberto Ligas.   Physical Exam:  General: Appropriate appearance for age.  Mental Status: Appropriate mood and affect.  Cardiovascular: RRR, no m/r/g.  Respiratory: CTAB, no rales/rhonchi/wheezing.  Skin: No apparent rashes or lesions.  Neuro: Awake, alert, and oriented x3. No apparent deficits.  MSK: Moving al 4 limbs antigravity and against resistance.   L knee: TTP along medial joint line and most tender over junction of medial joint line and patellar tendon. Small palpable effusion. Nontender to palpation over lateral joint line, superior or inferior patellar poles, or posterior knee. Pain worse with weightbearing.   PROCEDURE: L knee steroid injection Diagnosis: M25.562 Goals with treatment: [ x] Decrease pain [ x] Improve Active / Passive ROM '[ ]'$  Improve ADLs [ x] Improve functional mobility  MEDICATION:  [ x] Kenalog 40 mg/mL - 1 cc [ x] Lidocaine 1% - 3 cc  CONSENT: Obtained in writing followed by time-out. Consent uploaded to chart.  Benefits discussed.  Risks discussed included, but were not limited to, pain and discomfort, bleeding, bruising, allergic reaction, infection. All questions answered to patient/family member/guardian/ caregiver satisfaction. They would like to proceed with procedure. There are no noted contraindications to procedure.  PROCEDURE Time out was preformed No heat sources No antibiotics  The patient was explained about both the benefits and risks of a L knee steroid injection. After the patient acknowledged an understanding of the risks and benefits, the patient agreed to proceed. The area was first marked and then prepped in an aseptic fashion with  betadine / alcohol. A 22g, 1.5 inch needle was directed via anteriomedial approach into the left knee joint. The injection was completed with Kenalog 1 mg mixed with 3 cc of 1% lidocaine after no blood was aspirated on pull back.  The pt tolerated the procedure well. The patient reported immediate relief of the pain and improved pain free range of motion.   Preprocedural pain was 10/10 Postprocedural pain was 9/51 No complications were encountered. The patient tolerated the procedure well.  PLAN: - Resume Usual Activities. Notify Physician of any unusual bleeding, erythema or concern for side effects as reviewed above. - Apply ice prn for pain - Tylenol prn for pain - Follow up as scheduled with Dr. Dagoberto Ligas  Patient/Care [facility] was ready to learn without apparent learning barriers. Education was provided on diagnosis, treatment options/plan according to patient's preferred learning style. Patient/Care Giver verbalized understanding and agreement with the above plan.   Gertie Gowda, DO 01/15/2022

## 2022-01-15 NOTE — Telephone Encounter (Signed)
Wendy Carr saw Dr Tressa Busman today and had her injection. I also did a swab on her because of the lack of oxycodone in her urine test when she filled an Rx on 8/30 oxycodone acetaminophen #15 (2 days) and the test was done on 9/1 so there should have been been metabolites present and urine creatinine was abnormal. She called back this afternoon angry about her pain medication not being refilled " when she has done all we asked her to do". She is still in a great deal of pain and injection has not helped.

## 2022-01-18 LAB — DRUG TOX MONITOR 1 W/CONF, ORAL FLD
Amphetamines: NEGATIVE ng/mL (ref <10)
Barbiturates: NEGATIVE ng/mL (ref <10)
Benzodiazepines: NEGATIVE ng/mL (ref <0.50)
Benzoylecgonine: >250 ng/mL — ABNORMAL HIGH (ref <5.0)
Buprenorphine: NEGATIVE ng/mL (ref <0.10)
Cocaine: >250 ng/mL — ABNORMAL HIGH (ref <5.0)
Cocaine: POSITIVE ng/mL — AB (ref <5.0)
Cotinine: 14.8 ng/mL — ABNORMAL HIGH (ref <5.0)
Fentanyl: 3.86 ng/mL — ABNORMAL HIGH (ref <0.10)
Fentanyl: POSITIVE ng/mL — AB (ref <0.10)
Heroin Metabolite: NEGATIVE ng/mL (ref <1.0)
MARIJUANA: NEGATIVE ng/mL (ref <2.5)
MDMA: NEGATIVE ng/mL (ref <10)
Meprobamate: NEGATIVE ng/mL (ref <2.5)
Methadone: NEGATIVE ng/mL (ref <5.0)
Nicotine Metabolite: POSITIVE ng/mL — AB (ref <5.0)
Opiates: NEGATIVE ng/mL (ref <2.5)
Phencyclidine: NEGATIVE ng/mL (ref <10)
Tapentadol: NEGATIVE ng/mL (ref <5.0)
Tramadol: >500 ng/mL — ABNORMAL HIGH (ref <5.0)
Tramadol: POSITIVE ng/mL — AB (ref <5.0)
Zolpidem: NEGATIVE ng/mL (ref <5.0)

## 2022-01-19 ENCOUNTER — Telehealth: Payer: Self-pay | Admitting: *Deleted

## 2022-01-19 NOTE — Telephone Encounter (Signed)
-----   Message from Courtney Heys, MD sent at 01/19/2022  8:32 AM EDT ----- Please discharge from clinic- will not see anymore due to multiple (+)'s in drug screen-   Thanks- ML ----- Message ----- From: Interface, [facility] Lab Results In Sent: 01/18/2022  11:00 AM EDT To: Courtney Heys, MD

## 2022-01-19 NOTE — Telephone Encounter (Signed)
Letter mailed notifying of discharge from the clinic due to presence of cocaine and fentanyl in oral swab drug test. I have included a pamphlet for the [facility] for assistance with substance abuse.

## 2022-03-16 ENCOUNTER — Telehealth: Payer: Self-pay | Admitting: *Deleted

## 2022-03-16 NOTE — Telephone Encounter (Signed)
Transition Care Management Unsuccessful Follow-up Telephone Call  Date of discharge and from where:  03/15/2022 [facility]  Attempts:  1st Attempt  Reason for unsuccessful TCM follow-up call:  Left voice message

## 2022-03-19 ENCOUNTER — Other Ambulatory Visit: Payer: Self-pay | Admitting: Emergency Medicine

## 2022-03-19 DIAGNOSIS — I10 Essential (primary) hypertension: Secondary | ICD-10-CM

## 2022-03-19 NOTE — Telephone Encounter (Signed)
Transition Care Management Follow-up Telephone Call Date of discharge and from where: 03/15/2022 How have you been since you were released from the hospital? Patient states that she is still hurting  Any questions or concerns? No  Items Reviewed: Did the pt receive and understand the discharge instructions provided? Yes  Medications obtained and verified? Yes  Other? No  Any new allergies since your discharge? No  Dietary orders reviewed? No Do you have support at home? yes  Home Care and Equipment/Supplies: Were home health services ordered? not applicable If so, what is the name of the agency? N/a   Has the agency set up a time to come to the patient's home? not applicable Were any new equipment or medical supplies ordered?  No What is the name of the medical supply agency? N/a Were you able to get the supplies/equipment? not applicable Do you have any questions related to the use of the equipment or supplies? No  Functional Questionnaire: (I = Independent and D = Dependent) ADLs: I  Bathing/Dressing- I  Meal Prep- I  Eating- I  Maintaining continence- I  Transferring/Ambulation- I  Managing Meds- I  Follow up appointments reviewed:  PCP Hospital f/u appt confirmed? No  Scheduled to see N/A on N/A @ N/A. Patient was told to see a podiatrist first before your PCP. [facility] f/u appt confirmed? Yes  Scheduled to see a [facility] provider  on 03/21/2022 @ 2:40pm . Are transportation arrangements needed? No  If their condition worsens, is the pt aware to call PCP or go to the Emergency Dept.? Yes Was the patient provided with contact information for the PCP's office or ED? Yes Was to pt encouraged to call back with questions or concerns? Yes

## 2022-04-13 ENCOUNTER — Ambulatory Visit: Payer: Commercial Managed Care - HMO | Admitting: Physical Medicine and Rehabilitation

## 2022-04-19 ENCOUNTER — Other Ambulatory Visit: Payer: Self-pay

## 2022-04-19 NOTE — Telephone Encounter (Signed)
Dr. Dagoberto Ligas is out of the office:   Please advise or send requested scripts to [facility] on [address] in [place].Marland Kitchen

## 2022-05-22 ENCOUNTER — Other Ambulatory Visit: Payer: Self-pay | Admitting: Physical Medicine and Rehabilitation

## 2022-06-15 ENCOUNTER — Other Ambulatory Visit: Payer: Self-pay | Admitting: Emergency Medicine

## 2022-06-15 DIAGNOSIS — I10 Essential (primary) hypertension: Secondary | ICD-10-CM

## 2022-06-17 ENCOUNTER — Other Ambulatory Visit: Payer: Self-pay | Admitting: Physical Medicine and Rehabilitation

## 2022-07-25 ENCOUNTER — Other Ambulatory Visit: Payer: Self-pay

## 2022-07-25 ENCOUNTER — Ambulatory Visit (INDEPENDENT_AMBULATORY_CARE_PROVIDER_SITE_OTHER): Payer: Medicare HMO | Admitting: Family Medicine

## 2022-07-25 ENCOUNTER — Ambulatory Visit (INDEPENDENT_AMBULATORY_CARE_PROVIDER_SITE_OTHER): Payer: Commercial Managed Care - HMO

## 2022-07-25 ENCOUNTER — Encounter: Payer: Self-pay | Admitting: Family Medicine

## 2022-07-25 VITALS — BP 162/84 | HR 69 | Ht 62.0 in | Wt 187.8 lb

## 2022-07-25 DIAGNOSIS — M79672 Pain in left foot: Secondary | ICD-10-CM | POA: Diagnosis not present

## 2022-07-25 DIAGNOSIS — M1712 Unilateral primary osteoarthritis, left knee: Secondary | ICD-10-CM | POA: Diagnosis not present

## 2022-07-25 DIAGNOSIS — M25561 Pain in right knee: Secondary | ICD-10-CM | POA: Diagnosis not present

## 2022-07-25 DIAGNOSIS — G8929 Other chronic pain: Secondary | ICD-10-CM

## 2022-07-25 DIAGNOSIS — M25562 Pain in left knee: Secondary | ICD-10-CM

## 2022-07-25 DIAGNOSIS — M79671 Pain in right foot: Secondary | ICD-10-CM | POA: Diagnosis not present

## 2022-07-25 DIAGNOSIS — R202 Paresthesia of skin: Secondary | ICD-10-CM

## 2022-07-25 MED ORDER — GABAPENTIN 100 MG PO CAPS: 100.0000 mg | ORAL_CAPSULE | Freq: Every evening | ORAL | 1 refills | Status: DC | PRN

## 2022-07-25 NOTE — Progress Notes (Signed)
Right knee x-ray looks okay to radiology.  Muscle control could be a main problem.  If the shot does not work we may consider physical therapy.

## 2022-07-25 NOTE — Progress Notes (Signed)
I, Josepha Pigg, CMA acting as a Education administrator for Lynne Leader, MD.  Subjective:    CC: Bilat foot & knee pain  HPI: Pt is a 67 y/o female c/o bilat foot and knee pain x 6 months. Pt suffered a fall 1 week ago. Pt locates pain to right knee x 6 months. Ambulating with a cane today. Thinks falls are related to knee pain. C/O painful callus on feet. Notes soreness in 1st and 4th toe, darkening of the 4th toe. Has tried soaking the feet and using creams, minimal relief. Feels like needles underneath the toenail. Will feel like fire under the toes. Bruising present at medial aspect of left knee distal to patella s/p fall. Ad some relief of right knee pain s/p steroid inj.   She notes very bothersome nighttime numbness and tingling in her feet bilaterally.  She has been seen by podiatrist Dr. Boneta Lucks.  She would like a different podiatrist.  Knee swelling: yes Mechanical symptoms: yes Radiates: yes Aggravates: WB, ambulation Treatments tried:   Dx imaging: 11/15/19 L knee XR  01/20/18 L foot & L knee XR  Pertinent review of Systems: No fevers or chills  Relevant historical information: Hypertension.  Prior controlled substance contract with PMNR.   Objective:    Vitals:   07/25/22 0828 07/25/22 0907  BP: (!) 162/84 (!) 162/84  Pulse: 69   SpO2: 98%    General: Well Developed, well nourished, and in no acute distress.   MSK: Right knee: Mild effusion normal motion with crepitation.  Tender palpation medial joint line.  Left knee: Mild effusion normal motion with crepitation.  Tender palpation medial joint line.  Left foot: Callus formation metatarsal heads plantar aspect 2 and 3.  Capillary fill and pulses are intact.  Pain with light touch to feet.  Left foot: Callus formation metatarsal heads plantar aspect 2 and 3.  Capillary refill and pulses are intact.  Again painful to light touch in areas.  Some skin erythema and scaling present somewhat consistent appearance with  athlete's foot.  Neuro: Occasional whole body twitching and jerking during exam.  Lab and Radiology Results  Procedure: Real-time Ultrasound Guided Injection of right knee joint superior lateral patellar space Device: Philips Affiniti 50G Images permanently stored and available for review in PACS Verbal informed consent obtained.  Discussed risks and benefits of procedure. Warned about infection, bleeding, hyperglycemia damage to structures among others. Patient expresses understanding and agreement Time-out conducted.   Noted no overlying erythema, induration, or other signs of local infection.   Skin prepped in a sterile fashion.   Local anesthesia: Topical Ethyl chloride.   With sterile technique and under real time ultrasound guidance: 40 mg of Kenalog and 2 mL Marcaine injected into knee joint. Fluid seen entering the joint capsule.   Completed without difficulty   Pain immediately resolved suggesting accurate placement of the medication.   Advised to call if fevers/chills, erythema, induration, drainage, or persistent bleeding.   Images permanently stored and available for review in the ultrasound unit.  Impression: Technically successful ultrasound guided injection.    Procedure: Real-time Ultrasound Guided Injection of left knee joint superior lateral patellar space Device: Philips Affiniti 50G Images permanently stored and available for review in PACS Verbal informed consent obtained.  Discussed risks and benefits of procedure. Warned about infection, bleeding, hyperglycemia damage to structures among others. Patient expresses understanding and agreement Time-out conducted.   Noted no overlying erythema, induration, or other signs of local infection.  Skin prepped in a sterile fashion.   Local anesthesia: Topical Ethyl chloride.   With sterile technique and under real time ultrasound guidance: 40 mg of Kenalog and 2 mL Marcaine injected into knee joint. Fluid seen entering  the joint capsule.   Completed without difficulty   Pain immediately resolved suggesting accurate placement of the medication.   Advised to call if fevers/chills, erythema, induration, drainage, or persistent bleeding.   Images permanently stored and available for review in the ultrasound unit.  Impression: Technically successful ultrasound guided injection.      No results found for this or any previous visit (from the past 72 hour(s)). DG Knee AP/LAT W/Sunrise Left  Result Date: 07/25/2022 CLINICAL DATA:  Pain EXAM: LEFT KNEE 3 VIEWS COMPARISON:  None Available. FINDINGS: Mild tricompartmental degenerative changes without significant loss of joint space. No fracture, dislocation, or joint effusion. IMPRESSION: Mild tricompartmental degenerative changes without significant loss of joint space. Electronically Signed   By: Dorise Bullion III M.D.   On: 07/25/2022 10:12   DG Knee AP/LAT W/Sunrise Right  Result Date: 07/25/2022 CLINICAL DATA:  Pain EXAM: RIGHT KNEE 3 VIEWS COMPARISON:  None Available. FINDINGS: No evidence of fracture, dislocation, or joint effusion. No evidence of arthropathy or other focal bone abnormality. Soft tissues are unremarkable. IMPRESSION: Negative. Electronically Signed   By: Dorise Bullion III M.D.   On: 07/25/2022 10:11    I, Lynne Leader, personally (independently) visualized and performed the interpretation of the images attached in this note.   Impression and Recommendations:    Assessment and Plan: 67 y.o. female with bilateral knee pain.  Send the pain due to DJD.  Send the pain possibly due to patellofemoral pain syndrome.  Plan for bilateral steroid injection today and x-rays.  Recheck in about 3 weeks.  She has bothersome painful feet.  A lot of her pain may be due to peripheral neuropathy.  Plan for nerve conduction study.  I think that should be helpful to evaluate how much of its neuropathy versus other.  Have prescribed gabapentin to take  especially at bedtime which may be quite helpful for her.  She has multiple other complaints in her feet mostly around calluses.  She very likely has some metatarsalgia as well.  Recommend metatarsal pads or insoles.  I do think she would benefit from a podiatrist as working on those calluses is truly something a podiatrist is going to be better at then her primary care provider or me.  She is not happy with her current podiatrist.  I recommended Dr. Earleen Newport in the same group.  I am sure there are great podiatrist that would do a great job with her.  Recheck in about 3 weeks.  PDMP not reviewed this encounter. Orders Placed This Encounter  Procedures   [place] LIMITED JOINT SPACE STRUCTURES LOW BILAT(NO LINKED CHARGES)    Order Specific Question:   Reason for Exam (SYMPTOM  OR DIAGNOSIS REQUIRED)    Answer:   bl knee    Order Specific Question:   Preferred imaging location?    Answer:   [place]   DG Knee AP/LAT W/Sunrise Left    Standing Status:   Future    Number of Occurrences:   1    Standing Expiration Date:   08/25/2022    Order Specific Question:   Reason for Exam (SYMPTOM  OR DIAGNOSIS REQUIRED)    Answer:   bilateral knee pain    Order Specific Question:  Preferred imaging location?    Answer:   [facility]   DG Knee AP/LAT W/Sunrise Right    Standing Status:   Future    Number of Occurrences:   1    Standing Expiration Date:   08/25/2022    Order Specific Question:   Reason for Exam (SYMPTOM  OR DIAGNOSIS REQUIRED)    Answer:   bilateral knee pain    Order Specific Question:   Preferred imaging location?    Answer:   [facility]   Ambulatory referral to Neurology    Referral Priority:   Routine    Referral Type:   Consultation    Referral Reason:   Specialty Services Required    Requested Specialty:   Neurology    Number of Visits Requested:   1   NCV with EMG(electromyography)    Standing Status:   Future    Standing Expiration  Date:   07/25/2023    Order Specific Question:   Where should this test be performed?    Answer:   [id number]    Order Specific Question:   Location on body    Answer:   Lower extremity    Order Specific Question:   Laterality    Answer:   Right    Order Specific Question:   Clinical Indication    Answer:   paresthesia   Meds ordered this encounter  Medications   gabapentin (NEURONTIN) 100 MG capsule    Sig: Take 1-3 capsules (100-300 mg total) by mouth at bedtime as needed (nerve pain).    Dispense:  90 capsule    Refill:  1    Discussed warning signs or symptoms. Please see discharge instructions. Patient expresses understanding.   The above documentation has been reviewed and is accurate and complete Lynne Leader, M.D.

## 2022-07-25 NOTE — Patient Instructions (Addendum)
Thank you for coming in today.   Please get an Xray today before you leave   You should hear from neurology soon too.   Insoles with metatarsal pad or for metatarsalgia.  [employer] sells these.   Try gabapentin at bedtime.   Try generic lamisil cream. Terbinifine cream.   I like Dr Jacqualyn Posey at the podiatry office.   Check back in 3 weeks

## 2022-07-25 NOTE — Progress Notes (Signed)
Left knee x-ray shows mild arthritis changes

## 2022-07-31 ENCOUNTER — Encounter: Payer: Self-pay | Admitting: Neurology

## 2022-08-14 NOTE — Progress Notes (Unsigned)
Rubin Payor, PhD, LAT, ATC acting as a scribe for Clementeen Graham, MD.  Wendy Carr is a 67 y.o. female who presents to [name] today for 3-wk f/u bilat knee and feet pain. Pt was last seen by Dr. Denyse Amass on 07/25/22 and was given bilat knee steroid injections, a LE NCV study was ordered, and was advised to use MT pads or insoles. She was also advised to try Dr. Loreta Ave for a different podiatrist.   Today, pt reports knee sx are overall well managed. Larey Seat the other day landing on the left knee. Steroid injection was helpful. Pt a/so c/o bilat foot pain//calluses. Has not been to see Dr. Loreta Ave at podiatry yet.  Dx imaging: 07/25/22 R & L knee XR 11/15/19 L knee XR             01/20/18 L foot & L knee XR  Pertinent review of systems: No fevers or chills  Relevant historical information: Hypertension   Exam:  BP (!) 164/84   Pulse 73   Ht 5\' 2"  (1.575 m)   Wt 179 lb 12.8 oz (81.6 kg)   SpO2 99%   BMI 32.89 kg/m  General: Well Developed, well nourished, and in no acute distress.   MSK: Knees bilaterally normal-appearing no effusion normal motion intact strength.    Lab and Radiology Results  DG Knee AP/LAT W/Sunrise Left  Result Date: 07/25/2022 CLINICAL DATA:  Pain EXAM: LEFT KNEE 3 VIEWS COMPARISON:  None Available. FINDINGS: Mild tricompartmental degenerative changes without significant loss of joint space. No fracture, dislocation, or joint effusion. IMPRESSION: Mild tricompartmental degenerative changes without significant loss of joint space. Electronically Signed   By: Gerome Sam III M.D.   On: 07/25/2022 10:12   DG Knee AP/LAT W/Sunrise Right  Result Date: 07/25/2022 CLINICAL DATA:  Pain EXAM: RIGHT KNEE 3 VIEWS COMPARISON:  None Available. FINDINGS: No evidence of fracture, dislocation, or joint effusion. No evidence of arthropathy or other focal bone abnormality. Soft tissues are unremarkable. IMPRESSION: Negative. Electronically  Signed   By: Gerome Sam III M.D.   On: 07/25/2022 10:11   I, Clementeen Graham, personally (independently) visualized and performed the interpretation of the images attached in this note.    Assessment and Plan: 67 y.o. female with bilateral knee pain thought to be due to patellofemoral pain syndrome or perhaps some underlying DJD.  DJD not well-seen on x-ray. Steroid injection 3 weeks ago helped.  Additionally she has lower extremity paresthesias which have been hard to evaluate.  She has a nerve conduction study scheduled with Dr. Loleta Chance at the end of this month.  Plan to recheck based on how she feels and how it goes with neurology nerve conduction study.  Also recommend follow-up with podiatry.  Referral placed today.   PDMP not reviewed this encounter. Orders Placed This Encounter  Procedures   Ambulatory referral to Podiatry    Referral Priority:   Routine    Referral Type:   Consultation    Referral Reason:   Specialty Services Required    Referred to Provider:   Vivi Barrack, DPM    Requested Specialty:   Podiatry    Number of Visits Requested:   1   No orders of the defined types were placed in this encounter.    Discussed warning signs or symptoms. Please see discharge instructions. Patient expresses understanding.   The above documentation has been reviewed and is accurate and complete Clementeen Graham, M.D.

## 2022-08-15 ENCOUNTER — Encounter: Payer: Self-pay | Admitting: Family Medicine

## 2022-08-15 ENCOUNTER — Ambulatory Visit: Payer: Medicare HMO | Admitting: Family Medicine

## 2022-08-15 VITALS — BP 164/84 | HR 73 | Ht 62.0 in | Wt 179.8 lb

## 2022-08-15 DIAGNOSIS — M25562 Pain in left knee: Secondary | ICD-10-CM

## 2022-08-15 DIAGNOSIS — M79671 Pain in right foot: Secondary | ICD-10-CM | POA: Diagnosis not present

## 2022-08-15 DIAGNOSIS — R202 Paresthesia of skin: Secondary | ICD-10-CM | POA: Diagnosis not present

## 2022-08-15 DIAGNOSIS — G8929 Other chronic pain: Secondary | ICD-10-CM | POA: Diagnosis not present

## 2022-08-15 DIAGNOSIS — M25561 Pain in right knee: Secondary | ICD-10-CM | POA: Diagnosis not present

## 2022-08-15 DIAGNOSIS — M79672 Pain in left foot: Secondary | ICD-10-CM

## 2022-08-15 NOTE — Patient Instructions (Signed)
Thank you for coming in today.   You have your nerve test scheduled Late April.  Based on that we may change the plan.   Let me know about that splinter.   I am referring you to Dr Ardelle Anton podiatry.   I can repeat the same cortisone injection in your knees in late June. Let me know sooner if the cortisone injection is not lasting.

## 2022-08-16 ENCOUNTER — Other Ambulatory Visit: Payer: Self-pay | Admitting: Emergency Medicine

## 2022-08-16 ENCOUNTER — Other Ambulatory Visit: Payer: Self-pay | Admitting: Podiatry

## 2022-08-16 DIAGNOSIS — I10 Essential (primary) hypertension: Secondary | ICD-10-CM

## 2022-08-23 ENCOUNTER — Telehealth: Payer: Self-pay | Admitting: Family Medicine

## 2022-08-23 NOTE — Telephone Encounter (Signed)
Patient called and left a message stating that her left knee pain has returned.   Please advise on next steps.

## 2022-08-23 NOTE — Telephone Encounter (Signed)
Per visit note 08/15/22:  Assessment and Plan: 66 y.o. female with bilateral knee pain thought to be due to patellofemoral pain syndrome or perhaps some underlying DJD.  DJD not well-seen on x-ray. Steroid injection 3 weeks ago helped.   Additionally she has lower extremity paresthesias which have been hard to evaluate.  She has a nerve conduction study scheduled with Dr. Loleta Chance at the end of this month.   Plan to recheck based on how she feels and how it goes with neurology nerve conduction study.   Also recommend follow-up with podiatry.  Referral placed today.

## 2022-08-27 NOTE — Telephone Encounter (Signed)
Next step for her knees would be physical therapy.  It has been too soon to do another injection.  Would you like me to order physical therapy now?

## 2022-08-28 ENCOUNTER — Ambulatory Visit: Payer: Medicare HMO | Admitting: Neurology

## 2022-08-28 DIAGNOSIS — R202 Paresthesia of skin: Secondary | ICD-10-CM | POA: Diagnosis not present

## 2022-08-28 NOTE — Telephone Encounter (Signed)
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  

## 2022-08-28 NOTE — Procedures (Signed)
  [facility]  [address] [place], [address]  [place], [place] [address] Tel: [phone] Fax: [phone] Test Date:  08/28/2022  Patient: Wendy Carr DOB: [birth year] Physician: Jacquelyne Balint, MD  Sex: Female Height:  Ref Phys: Clementeen Graham, MD  ID#: [id number]   Technician:    History: This is a 67 year old female with pain in bilateral feet.  NCV & EMG Findings: This is a limited electrodiagnostic evaluation of the right lower limb. Patient could not tolerate procedure due to pain, so study was terminated during nerve conduction studies. The limited evaluation shows: Right sural and superficial peroneal/fibular sensory responses are within normal limits. Right peroneal/fibular (EDB) and tibial (AH) motor responses are within normal limits. Patient could not tolerate proximal stimulation of the right tibial (AH) motor nerve.  Impression: This limited evaluation shows no abnormalities on performed nerve conduction studies. This evaluation is too limited to reach meaningful diagnostic conclusions.   ___________________________ Jacquelyne Balint, MD    Nerve Conduction Studies Motor Nerve Results    Latency Amplitude F-Lat Segment Distance CV Comment  Site (ms) Norm (mV) Norm (ms)  (cm) (m/s) Norm   Right Fibular (EDB) Motor  Ankle 2.8  < 6.0 4.5  > 2.5        Bel fib head 9.3 - 4.1 -  Bel fib head-Ankle 28 43  > 40   Pop fossa 11.2 - 3.9 -  Pop fossa-Bel fib head 8 42 -   Right Tibial (AH) Motor  Ankle 4.1  < 6.0 10.2  > 4.0         Sensory Sites    Neg Peak Lat Amplitude (O-P) Segment Distance Velocity Comment  Site (ms) Norm (V) Norm  (cm) (ms)   Right Superficial Fibular Sensory  14 cm-Ankle 2.7  < 4.6 4  > 3 14 cm-Ankle 14    Right Sural Sensory  Calf-Lat mall 3.1  < 4.6 6  > 3 Calf-Lat mall 12        Waveforms:  Motor      Sensory

## 2022-08-29 ENCOUNTER — Telehealth: Payer: Self-pay | Admitting: Family Medicine

## 2022-08-29 ENCOUNTER — Other Ambulatory Visit: Payer: Self-pay | Admitting: Physical Medicine and Rehabilitation

## 2022-08-29 NOTE — Progress Notes (Signed)
Unfortunately the nerve conduction study was too painful to continue therefore it is really hard for [place] to know what is going on with the nerves.

## 2022-08-29 NOTE — Telephone Encounter (Signed)
Pt called tearful, knee pain is very bad. She has tried all OTC creams to no avail. Also had a neurology visit yesterday that seem aggravate the situation. Is still taking gabapentin.  We are awaiting gel approval, could we give her something for pain in the interim? She is starting a new job on Friday and is very worried.

## 2022-08-29 NOTE — Telephone Encounter (Signed)
Prior auth required for ORTHOVISC for BILAT knee OA        

## 2022-08-30 ENCOUNTER — Ambulatory Visit (INDEPENDENT_AMBULATORY_CARE_PROVIDER_SITE_OTHER): Payer: Medicare HMO

## 2022-08-30 ENCOUNTER — Other Ambulatory Visit: Payer: Self-pay | Admitting: Physical Medicine and Rehabilitation

## 2022-08-30 ENCOUNTER — Encounter: Payer: Self-pay | Admitting: Podiatry

## 2022-08-30 ENCOUNTER — Telehealth: Payer: Self-pay

## 2022-08-30 ENCOUNTER — Ambulatory Visit (INDEPENDENT_AMBULATORY_CARE_PROVIDER_SITE_OTHER): Payer: Medicare HMO | Admitting: Podiatry

## 2022-08-30 DIAGNOSIS — M216X2 Other acquired deformities of left foot: Secondary | ICD-10-CM

## 2022-08-30 DIAGNOSIS — G8929 Other chronic pain: Secondary | ICD-10-CM

## 2022-08-30 DIAGNOSIS — L600 Ingrowing nail: Secondary | ICD-10-CM | POA: Diagnosis not present

## 2022-08-30 DIAGNOSIS — L84 Corns and callosities: Secondary | ICD-10-CM | POA: Diagnosis not present

## 2022-08-30 DIAGNOSIS — M79671 Pain in right foot: Secondary | ICD-10-CM | POA: Diagnosis not present

## 2022-08-30 DIAGNOSIS — L608 Other nail disorders: Secondary | ICD-10-CM | POA: Diagnosis not present

## 2022-08-30 DIAGNOSIS — M79609 Pain in unspecified limb: Secondary | ICD-10-CM

## 2022-08-30 DIAGNOSIS — L603 Nail dystrophy: Secondary | ICD-10-CM | POA: Diagnosis not present

## 2022-08-30 DIAGNOSIS — B351 Tinea unguium: Secondary | ICD-10-CM | POA: Diagnosis not present

## 2022-08-30 DIAGNOSIS — M216X9 Other acquired deformities of unspecified foot: Secondary | ICD-10-CM

## 2022-08-30 DIAGNOSIS — M79672 Pain in left foot: Secondary | ICD-10-CM | POA: Diagnosis not present

## 2022-08-30 DIAGNOSIS — M216X1 Other acquired deformities of right foot: Secondary | ICD-10-CM

## 2022-08-30 MED ORDER — TRAMADOL HCL 50 MG PO TABS: 50.0000 mg | ORAL_TABLET | Freq: Three times a day (TID) | ORAL | 0 refills | Status: DC | PRN

## 2022-08-30 NOTE — Telephone Encounter (Signed)
Rodolph Bong, MD 08/29/2022  7:09 AM EDT Back to Top    Unfortunately the nerve conduction study was too painful to continue therefore it is really hard for [place] to know what is going on with the nerves.

## 2022-08-30 NOTE — Progress Notes (Signed)
Subjective: Chief Complaint  Patient presents with   Foot Pain    Patient states she is having a lot of knee pain lately and its affecting gait, feet starting to get multiple calluses due to the way she is walking   67 year old female presents the office today with concerns of chronic foot pain.  She said that she has pain on her toenails as well as ongoing calluses.  She also gets needles type sensations to her feet also in her big toes.  She does not report any recent injuries.  No open lesions.  She has previously seen 2 other podiatrist for her foot issues.  She is asking for pain medication.  Tramadol was recently prescribed today for her.  She is on gabapentin as well but she states that she is taking 3 tablets at nighttime and not seeing any improvement.  Objective: AAO x3, NAD DP/PT pulses palpable bilaterally, CRT less than 3 seconds Sensation appears to be intact with Semmes Weinstein monofilament.  Negative Tinel sign. There is prominence of metatarsal heads plantarly.  There is hyperkeratotic lesions along the medial aspect of the first MPJ as well as the hallux distally.  There is no underlying ulceration drainage or any signs of infection.  The nails are hypertrophic, dystrophic with yellow, brown discoloration and there is mild incurvation of the nail borders.  There is no edema, erythema.  There is no area of pinpoint tenderness.  Flexor, extensor tendons are intact.  MMT 5/5. No pain with calf compression, swelling, warmth, erythema  Assessment: Bilateral chronic foot pain  Plan: -All treatment options discussed with the patient including all alternatives, risks, complications.  -I have debrided the calluses with any complications or bleeding.  Dispensed urea cream to apply daily. -I sharply debrided the nails x 10 without any complications or bleeding.  I have sent the sample of the nails for culture to evaluate for nail fungus.  We discussed treatment options for nail fungus  but await the results of the culture before further treatment for this.  Consider partial nail avulsions or toenail removals given the ingrown toenails, nail thickening. -Will check orthotic benefit coverage for her.  I do think an accommodative offloading orthotic will be helpful. -After debrided the nails and calluses she states her feet felt much better.  She is asked about pain medication tramadol was recently prescribed.  She is happy with that I did discuss with her possible pain management referral. -Patient encouraged to call the office with any questions, concerns, change in symptoms.    Vivi Barrack DPM

## 2022-08-30 NOTE — Telephone Encounter (Signed)
Pt given refill and fatigue/sedation concerns via VM.

## 2022-08-30 NOTE — Telephone Encounter (Signed)
Called pt to review results of EMG/NCV per Dr. Denyse Amass.   Pt notes seeing podiatry today, wanted to Denyse Amass to know how much she liked Dr. Ardelle Anton and how gentle he was. He is going to try to get her insoles covered by insurance.   Pt also inquired about alternative testing or next steps for the nerve-type sx.   Forwarding to Dr. Denyse Amass to advise.

## 2022-08-30 NOTE — Telephone Encounter (Signed)
Prior Authorization initiated for ORTHOVISC via [email or web address] KEY: [id number]

## 2022-08-30 NOTE — Telephone Encounter (Signed)
I have prescribed tramadol for pain control.  Use it sparingly as it can cause fatigue or sedation.

## 2022-08-31 NOTE — Telephone Encounter (Signed)
Appointment scheduled.

## 2022-08-31 NOTE — Telephone Encounter (Signed)
Per Dr. Denyse Amass - We could try treating it. Recommend check back with me at some point in the near future.   Please see if pt would like to schedule appointment with Dr. Denyse Amass, thank you!

## 2022-08-31 NOTE — Telephone Encounter (Signed)
Note from [employer] portal rep:

## 2022-08-31 NOTE — Telephone Encounter (Signed)
Orthovisc APPROVED  Key: [id number] PA Case ID #: [id number] Valid: 08/30/22-05/07/23

## 2022-08-31 NOTE — Telephone Encounter (Signed)
I think Dr. Ardelle Anton would be a good choice.  I like him a lot. We do not really know what is going on with your nerves.  We could try treating it.  Recommend check back with me at some point in the near future.

## 2022-09-05 NOTE — Progress Notes (Unsigned)
Rubin Payor, PhD, LAT, ATC acting as a scribe for Clementeen Graham, MD.   Wendy Carr is a 67 y.o. female who presents to [facility] at [facility] today for f/u bilat knee pain, bilat feet paresthesia and LE NCV study review. Pt was last seen by Dr. Denyse Amass on 08/15/22 and was referred to podiatry and advised to f/u after NCV study. Today, pt reports continued bilat knee pain, L>R. Pins and needles, burning sensation in left great toe. She currently has gabapentin.  She takes 200 mg in the morning and 300 mg at bedtime.  She also is using some tramadol.  Medicine does help.  Dx testing: 08/30/22 R & L foot XR  08/28/22 NCV study  01/04/21 L-spine XR  Pertinent review of systems: No fevers or chills  Relevant historical information: Hypertension   Exam:  BP (!) 164/82   Pulse 93   Ht 5\' 2"  (1.575 m)   Wt 183 lb 12.8 oz (83.4 kg)   SpO2 100%   BMI 33.62 kg/m  General: Well Developed, well nourished, and in no acute distress.   MSK: Knees bilaterally normal-appearing Normal motion.    Lab and Radiology Results  Orthovisc injection bilateral knees 1/3 Procedure: Real-time Ultrasound Guided Injection of right knee joint superior lateral patellar space Device: Philips Affiniti 50G Images permanently stored and available for review in PACS Verbal informed consent obtained.  Discussed risks and benefits of procedure. Warned about infection, bleeding, damage to structures among others. Patient expresses understanding and agreement Time-out conducted.   Noted no overlying erythema, induration, or other signs of local infection.   Skin prepped in a sterile fashion.   Local anesthesia: Topical Ethyl chloride.   With sterile technique and under real time ultrasound guidance: Orthovisc 30 mg injected into knee joint. Fluid seen entering the joint capsule.   Completed without difficulty   Advised to call if fevers/chills, erythema, induration, drainage, or persistent  bleeding.   Images permanently stored and available for review in the ultrasound unit.  Impression: Technically successful ultrasound guided injection.    Procedure: Real-time Ultrasound Guided Injection of left knee joint superior lateral patellar space Device: Philips Affiniti 50G Images permanently stored and available for review in PACS Verbal informed consent obtained.  Discussed risks and benefits of procedure. Warned about infection, bleeding, damage to structures among others. Patient expresses understanding and agreement Time-out conducted.   Noted no overlying erythema, induration, or other signs of local infection.   Skin prepped in a sterile fashion.   Local anesthesia: Topical Ethyl chloride.   With sterile technique and under real time ultrasound guidance: Orthovisc 30 mg injected into knee joint. Fluid seen entering the joint capsule.   Completed without difficulty   Advised to call if fevers/chills, erythema, induration, drainage, or persistent bleeding.   Images permanently stored and available for review in the ultrasound unit.  Impression: Technically successful ultrasound guided injection. Lot number: [id number] both injections   Nerve conduction study dated August 28, 2022 NCV & EMG Findings: This is a limited electrodiagnostic evaluation of the right lower limb. Patient could not tolerate procedure due to pain, so study was terminated during nerve conduction studies. The limited evaluation shows: Right sural and superficial peroneal/fibular sensory responses are within normal limits. Right peroneal/fibular (EDB) and tibial (AH) motor responses are within normal limits. Patient could not tolerate proximal stimulation of the right tibial (AH) motor nerve.   Impression: This limited evaluation shows no abnormalities on performed nerve conduction  studies. This evaluation is too limited to reach meaningful diagnostic conclusions.     Assessment and Plan: 67 y.o. 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  Plan to start hyaluronic acid series now.  Return in 1 week for Orthovisc injection bilateral knees 2/3  We spent a fair amount of time talking about her paresthesias in her bilateral feet.  Unfortunately she was unable to complete nerve conduction studies that we do not have a firm diagnosis.  Her symptoms are most consistent with peripheral neuropathy. We could proceed with a lumbar spine MRI but for now I think we should try treating symptomatically.  Increase gabapentin to 300 mg 3 times daily as needed.  Refill tramadol sparingly.      PDMP not reviewed this encounter. Orders Placed This Encounter  Procedures   [place] LIMITED JOINT SPACE STRUCTURES LOW BILAT(NO LINKED CHARGES)    Order Specific Question:   Reason for Exam (SYMPTOM  OR DIAGNOSIS REQUIRED)    Answer:   bilat knee OA/pain    Order Specific Question:   Preferred imaging location?    Answer:   [facility] ordered this encounter  Medications   DISCONTD: gabapentin (NEURONTIN) 100 MG capsule    Sig: Take 1-3 capsules (100-300 mg total) by mouth 3 (three) times daily as needed (nerve pain).    Dispense:  90 capsule    Refill:  1   traMADol (ULTRAM) 50 MG tablet    Sig: Take 1 tablet (50 mg total) by mouth every 8 (eight) hours as needed for severe pain.    Dispense:  15 tablet    Refill:  0   Hyaluronan (ORTHOVISC) intra-articular injection 30 mg   Hyaluronan (ORTHOVISC) intra-articular injection 30 mg   gabapentin (NEURONTIN) 300 MG capsule    Sig: Take 1 capsule (300 mg total) by mouth 3 (three) times daily as needed (nerve pain).    Dispense:  90 capsule    Refill:  1     Discussed warning signs or symptoms. Please see discharge instructions. Patient expresses understanding.   The above documentation has been reviewed and is accurate and complete Clementeen Graham, M.D.

## 2022-09-05 NOTE — Telephone Encounter (Signed)
Orthovisc for BILAT knee OA  Primary Insurance: [employer] HMO Co-Pay: $10 Co-Insurance: 20% Deductible: does not apply  Prior Auth: APPROVED [place] Case ID #: [id number] Valid: 08/30/22-05/07/23

## 2022-09-05 NOTE — Telephone Encounter (Signed)
Patient already scheduled for tomorrow, note added to appointment.

## 2022-09-06 ENCOUNTER — Other Ambulatory Visit: Payer: Self-pay

## 2022-09-06 ENCOUNTER — Ambulatory Visit (INDEPENDENT_AMBULATORY_CARE_PROVIDER_SITE_OTHER): Payer: Medicare HMO | Admitting: Family Medicine

## 2022-09-06 ENCOUNTER — Encounter: Payer: Self-pay | Admitting: Family Medicine

## 2022-09-06 VITALS — BP 164/82 | HR 93 | Ht 62.0 in | Wt 183.8 lb

## 2022-09-06 DIAGNOSIS — M170 Bilateral primary osteoarthritis of knee: Secondary | ICD-10-CM | POA: Diagnosis not present

## 2022-09-06 DIAGNOSIS — R202 Paresthesia of skin: Secondary | ICD-10-CM | POA: Diagnosis not present

## 2022-09-06 DIAGNOSIS — M25562 Pain in left knee: Secondary | ICD-10-CM

## 2022-09-06 DIAGNOSIS — M79671 Pain in right foot: Secondary | ICD-10-CM

## 2022-09-06 DIAGNOSIS — G8929 Other chronic pain: Secondary | ICD-10-CM

## 2022-09-06 DIAGNOSIS — M79672 Pain in left foot: Secondary | ICD-10-CM

## 2022-09-06 DIAGNOSIS — M25561 Pain in right knee: Secondary | ICD-10-CM

## 2022-09-06 MED ORDER — TRAMADOL HCL 50 MG PO TABS: 50.0000 mg | ORAL_TABLET | Freq: Three times a day (TID) | ORAL | 0 refills | Status: DC | PRN

## 2022-09-06 MED ORDER — HYALURONAN 30 MG/2ML IX SOSY
30.0000 mg | PREFILLED_SYRINGE | Freq: Once | INTRA_ARTICULAR | Status: AC
  Administered 2022-09-06: 30 mg via INTRA_ARTICULAR

## 2022-09-06 MED ORDER — GABAPENTIN 100 MG PO CAPS: 100.0000 mg | ORAL_CAPSULE | Freq: Three times a day (TID) | ORAL | 1 refills | Status: DC | PRN

## 2022-09-06 MED ORDER — GABAPENTIN 300 MG PO CAPS: 300.0000 mg | ORAL_CAPSULE | Freq: Three times a day (TID) | ORAL | 1 refills | Status: DC | PRN

## 2022-09-06 NOTE — Patient Instructions (Signed)
Thank you for coming in today.   Increase gabapentin to 300 mg three time daily as needed.   Use tramadol spraingly   Please use Voltaren gel (Generic Diclofenac Gel) up to 4x daily for pain as needed.  This is available over-the-counter as both the name brand Voltaren gel and the generic diclofenac gel.   Also icyhot or tiger balm or blue emu or biofreeze.   Return next  week and the week after for gel shots #2 and #3

## 2022-09-07 NOTE — Telephone Encounter (Addendum)
Orthovisc Injection Schedule  #1 09/06/22 #2 09/13/22 #3 09/20/22

## 2022-09-11 ENCOUNTER — Other Ambulatory Visit: Payer: Self-pay | Admitting: Podiatry

## 2022-09-11 MED ORDER — UREA NAIL 45 % EX GEL: 1.0000 | Freq: Every day | CUTANEOUS | 1 refills | Status: AC

## 2022-09-13 ENCOUNTER — Ambulatory Visit (INDEPENDENT_AMBULATORY_CARE_PROVIDER_SITE_OTHER): Payer: Medicare HMO | Admitting: Family Medicine

## 2022-09-13 ENCOUNTER — Telehealth: Payer: Self-pay | Admitting: Family Medicine

## 2022-09-13 ENCOUNTER — Other Ambulatory Visit: Payer: Self-pay

## 2022-09-13 DIAGNOSIS — M25562 Pain in left knee: Secondary | ICD-10-CM

## 2022-09-13 DIAGNOSIS — M170 Bilateral primary osteoarthritis of knee: Secondary | ICD-10-CM | POA: Diagnosis not present

## 2022-09-13 DIAGNOSIS — M25561 Pain in right knee: Secondary | ICD-10-CM

## 2022-09-13 DIAGNOSIS — G8929 Other chronic pain: Secondary | ICD-10-CM

## 2022-09-13 MED ORDER — HYALURONAN 30 MG/2ML IX SOSY
30.0000 mg | PREFILLED_SYRINGE | Freq: Once | INTRA_ARTICULAR | Status: AC
Start: 2022-09-13 — End: 2022-09-13
  Administered 2022-09-13: 30 mg via INTRA_ARTICULAR

## 2022-09-13 NOTE — Progress Notes (Signed)
Wendy Carr presents to clinic today for Orthovisc injection bilateral knees 2/3.  Wendy Carr is starting to feel better after the first injection.  Procedure: Real-time Ultrasound Guided Injection of right knee joint superior lateral patellar space Device: Philips Affiniti 50G Images permanently stored and available for review in PACS Verbal informed consent obtained.  Discussed risks and benefits of procedure. Warned about infection, bleeding, damage to structures among others. Patient expresses understanding and agreement Time-out conducted.   Noted no overlying erythema, induration, or other signs of local infection.   Skin prepped in a sterile fashion.   Local anesthesia: Topical Ethyl chloride.   With sterile technique and under real time ultrasound guidance: Orthovisc 30 mg injected into knee joint. Fluid seen entering the joint capsule.   Completed without difficulty   Advised to call if fevers/chills, erythema, induration, drainage, or persistent bleeding.   Images permanently stored and available for review in the ultrasound unit.  Impression: Technically successful ultrasound guided injection.  Procedure: Real-time Ultrasound Guided Injection of left knee joint superior lateral patellar space Device: Philips Affiniti 50G Images permanently stored and available for review in PACS Verbal informed consent obtained.  Discussed risks and benefits of procedure. Warned about infection, bleeding, damage to structures among others. Patient expresses understanding and agreement Time-out conducted.   Noted no overlying erythema, induration, or other signs of local infection.   Skin prepped in a sterile fashion.   Local anesthesia: Topical Ethyl chloride.   With sterile technique and under real time ultrasound guidance: Orthovisc 30 mg injected into knee joint. Fluid seen entering the joint capsule.   Completed without difficulty   Advised to call if fevers/chills, erythema, induration, drainage, or  persistent bleeding.   Images permanently stored and available for review in the ultrasound unit.  Impression: Technically successful ultrasound guided injection.   Lot number: [id number] for both injections.  Return in 1 week for Orthovisc injection bilateral knees 3/3

## 2022-09-13 NOTE — Telephone Encounter (Signed)
Patient called to let Dr Denyse Amass know that the cream they talked about for her foot and knee is called Revitaderm 40.

## 2022-09-13 NOTE — Patient Instructions (Signed)
Thank you for coming in today.   You received an injection today. Seek immediate medical attention if the joint becomes red, extremely painful, or is oozing fluid.   We will see you next week for the 3rd Orthovisc injections.

## 2022-09-14 NOTE — Telephone Encounter (Signed)
Called pt and advised per Dr. Denyse Amass. Pt verbalized understanding.

## 2022-09-14 NOTE — Telephone Encounter (Signed)
The active ingredient in that foot cream is urea.  Will typically use this to help soften calluses.  Should be okay to continue using on your foot as long as it is not irritating her skin too much.

## 2022-09-18 ENCOUNTER — Other Ambulatory Visit: Payer: Self-pay | Admitting: Physical Medicine and Rehabilitation

## 2022-09-18 ENCOUNTER — Other Ambulatory Visit: Payer: Self-pay | Admitting: Emergency Medicine

## 2022-09-18 DIAGNOSIS — I10 Essential (primary) hypertension: Secondary | ICD-10-CM

## 2022-09-20 ENCOUNTER — Other Ambulatory Visit: Payer: Self-pay

## 2022-09-20 ENCOUNTER — Ambulatory Visit (INDEPENDENT_AMBULATORY_CARE_PROVIDER_SITE_OTHER): Payer: Medicare HMO | Admitting: Family Medicine

## 2022-09-20 DIAGNOSIS — M25562 Pain in left knee: Secondary | ICD-10-CM | POA: Diagnosis not present

## 2022-09-20 DIAGNOSIS — M25561 Pain in right knee: Secondary | ICD-10-CM | POA: Diagnosis not present

## 2022-09-20 DIAGNOSIS — G8929 Other chronic pain: Secondary | ICD-10-CM | POA: Diagnosis not present

## 2022-09-20 DIAGNOSIS — M170 Bilateral primary osteoarthritis of knee: Secondary | ICD-10-CM

## 2022-09-20 MED ORDER — HYALURONAN 30 MG/2ML IX SOSY
30.0000 mg | PREFILLED_SYRINGE | Freq: Once | INTRA_ARTICULAR | Status: AC
  Administered 2022-09-20: 30 mg via INTRA_ARTICULAR

## 2022-09-20 MED ORDER — TRAMADOL HCL 50 MG PO TABS: 50.0000 mg | ORAL_TABLET | Freq: Three times a day (TID) | ORAL | 0 refills | Status: DC | PRN

## 2022-09-20 MED ORDER — HYALURONAN 30 MG/2ML IX SOSY
30.0000 mg | PREFILLED_SYRINGE | Freq: Once | INTRA_ARTICULAR | Status: AC
Start: 2022-09-20 — End: 2022-09-20
  Administered 2022-09-20: 30 mg via INTRA_ARTICULAR

## 2022-09-20 NOTE — Patient Instructions (Signed)
Thank you for coming in today.   You received an injection today. Seek immediate medical attention if the joint becomes red, extremely painful, or is oozing fluid.  

## 2022-09-20 NOTE — Progress Notes (Signed)
Wendy Carr presents to clinic today for Orthovisc injection bilateral knees 3/3  Procedure: Real-time Ultrasound Guided Injection of right knee joint superior lateral patellar space Device: Philips Affiniti 50G Images permanently stored and available for review in PACS Verbal informed consent obtained.  Discussed risks and benefits of procedure. Warned about infection, bleeding, damage to structures among others. Patient expresses understanding and agreement Time-out conducted.   Noted no overlying erythema, induration, or other signs of local infection.   Skin prepped in a sterile fashion.   Local anesthesia: Topical Ethyl chloride.   With sterile technique and under real time ultrasound guidance: Orthovisc 30 mg injected into knee joint. Fluid seen entering the joint capsule.   Completed without difficulty   Advised to call if fevers/chills, erythema, induration, drainage, or persistent bleeding.   Images permanently stored and available for review in the ultrasound unit.  Impression: Technically successful ultrasound guided injection.    Procedure: Real-time Ultrasound Guided Injection of left knee joint superior lateral patellar space Device: Philips Affiniti 50G Images permanently stored and available for review in PACS Verbal informed consent obtained.  Discussed risks and benefits of procedure. Warned about infection, bleeding, damage to structures among others. Patient expresses understanding and agreement Time-out conducted.   Noted no overlying erythema, induration, or other signs of local infection.   Skin prepped in a sterile fashion.   Local anesthesia: Topical Ethyl chloride.   With sterile technique and under real time ultrasound guidance: Orthovisc 30 mg injected into knee joint. Fluid seen entering the joint capsule.   Completed without difficulty   Advised to call if fevers/chills, erythema, induration, drainage, or persistent bleeding.   Images permanently stored and  available for review in the ultrasound unit.  Impression: Technically successful ultrasound guided injection.    Lot number: [id number] for both injections  Return as needed

## 2022-09-20 NOTE — Telephone Encounter (Signed)
Pt completed Orthovisc series for BILAT knee OA 09/20/22.  Can consider repeat on or after 03/24/23.

## 2022-10-04 ENCOUNTER — Telehealth: Payer: Self-pay

## 2022-10-04 MED ORDER — TRAMADOL HCL 50 MG PO TABS: 50.0000 mg | ORAL_TABLET | Freq: Three times a day (TID) | ORAL | 0 refills | Status: DC | PRN

## 2022-10-04 NOTE — Telephone Encounter (Signed)
Patient called she is not doing well, knees are in a bad way. Patient was having a hard time because her mom was taken off life support today.  She would like a refill on her tramadol sent to [facility]

## 2022-10-04 NOTE — Telephone Encounter (Signed)
Meds sent

## 2022-10-04 NOTE — Telephone Encounter (Signed)
Patient notified this has been sent in

## 2022-10-16 ENCOUNTER — Other Ambulatory Visit: Payer: Self-pay | Admitting: Family Medicine

## 2022-10-16 NOTE — Telephone Encounter (Signed)
Patient called requesting a refill on: traMADol (ULTRAM) 50 MG tablet  Pharmacy: [facility] [address]

## 2022-10-16 NOTE — Telephone Encounter (Signed)
Last OV 09/20/22 Next OV not scheduled  Last refill 10/04/22  Qty # 15/0  Forwarding to Dr. Denyse Amass.

## 2022-10-17 MED ORDER — TRAMADOL HCL 50 MG PO TABS: 50.0000 mg | ORAL_TABLET | Freq: Three times a day (TID) | ORAL | 0 refills | Status: DC | PRN

## 2022-10-17 NOTE — Telephone Encounter (Signed)
Done

## 2022-10-17 NOTE — Telephone Encounter (Signed)
Duplicate rx refill request. Dr. Denyse Amass already refilled rx.

## 2022-11-01 ENCOUNTER — Other Ambulatory Visit: Payer: Self-pay | Admitting: Family Medicine

## 2022-11-01 MED ORDER — TRAMADOL HCL 50 MG PO TABS: 50.0000 mg | ORAL_TABLET | Freq: Three times a day (TID) | ORAL | 0 refills | Status: DC | PRN

## 2022-11-01 NOTE — Telephone Encounter (Signed)
Last OV 09/20/22  Last refill 10/17/22 Qty #15/0  Controlled substance, forwarding to Dr. Denyse Amass.

## 2022-11-01 NOTE — Telephone Encounter (Signed)
Patient called stating that she is having a lot of worsening knee pain and has fallen recently as well. She was very tearful and crying on the phone due to the pain and her mother passing recently. Patient is scheduled to see Dr Denyse Amass on Monday but asked for a refill on traMADol (ULTRAM) 50 MG tablet is possible.  Please advise.

## 2022-11-02 NOTE — Progress Notes (Deleted)
   Rubin Payor, PhD, LAT, ATC acting as a scribe for Wendy Graham, MD.  Wendy Carr is a 67 y.o. female who presents to [facility] at [facility] today for worsening bilat knee pain. Her mom recently passed away. Pt was last seen by Dr. Denyse Amass on 09/20/22 and completed the Orthovisc series, 3/3, bilaterally. Last bilat knee steroid injections on 07/25/22.  Today, pt reports she suffered a fall on ***. Pt locates pain to ***  Bilat knee swelling:  Dx imaging: 07/25/22 R & L knee XR 11/15/19 L knee XR             9/16/19L knee XR  She was seen in the emergency room yesterday for bilateral foot pain.  This typically is under the care of podiatry.  She ran out of tramadol and presented to the emergency room where she was given hydrocodone.  Pertinent review of systems: ***  Relevant historical information: ***   Exam:  There were no vitals taken for this visit. General: Well Developed, well nourished, and in no acute distress.   MSK: ***    Lab and Radiology Results No results found for this or any previous visit (from the past 72 hour(s)). No results found.     Assessment and Plan: 67 y.o. female with ***   PDMP not reviewed this encounter. No orders of the defined types were placed in this encounter.  No orders of the defined types were placed in this encounter.    Discussed warning signs or symptoms. Please see discharge instructions. Patient expresses understanding.   ***

## 2022-11-04 ENCOUNTER — Other Ambulatory Visit: Payer: Self-pay

## 2022-11-04 ENCOUNTER — Encounter (HOSPITAL_COMMUNITY): Payer: Self-pay

## 2022-11-04 ENCOUNTER — Emergency Department (HOSPITAL_COMMUNITY)
Admission: EM | Admit: 2022-11-04 | Discharge: 2022-11-04 | Disposition: A | Discharge status: Home / Self Care | Payer: Medicare HMO | Attending: Emergency Medicine | Admitting: Emergency Medicine

## 2022-11-04 DIAGNOSIS — E039 Hypothyroidism, unspecified: Secondary | ICD-10-CM | POA: Diagnosis not present

## 2022-11-04 DIAGNOSIS — Z79899 Other long term (current) drug therapy: Secondary | ICD-10-CM | POA: Insufficient documentation

## 2022-11-04 DIAGNOSIS — G8929 Other chronic pain: Secondary | ICD-10-CM | POA: Diagnosis not present

## 2022-11-04 DIAGNOSIS — M79671 Pain in right foot: Secondary | ICD-10-CM | POA: Diagnosis not present

## 2022-11-04 DIAGNOSIS — I10 Essential (primary) hypertension: Secondary | ICD-10-CM | POA: Diagnosis not present

## 2022-11-04 DIAGNOSIS — M79672 Pain in left foot: Secondary | ICD-10-CM | POA: Diagnosis not present

## 2022-11-04 MED ORDER — HYDROCODONE-ACETAMINOPHEN 5-325 MG PO TABS
1.0000 | ORAL_TABLET | Freq: Once | ORAL | Status: AC
  Administered 2022-11-04: 1 via ORAL
  Filled 2022-11-04: qty 1

## 2022-11-04 NOTE — ED Triage Notes (Signed)
Patient arrives POV c/o bilateral feet pain d/t calluses on "both balls of feet up to the toes." Patient is followed by [facility] for her feet problems. Patient also reports pain in left knee.

## 2022-11-04 NOTE — ED Provider Notes (Signed)
[place] EMERGENCY DEPARTMENT AT [facility] Provider Note   CSN: [id number] Arrival date & time: 11/04/22  0417     History  Chief Complaint  Patient presents with   Foot Pain    Deadra Garzon is a 67 y.o. female.  67 y/o female with hx of HTN, hypothyroid, anemia presents to the ED for pain in her bilateral feet. Pain has been ongoing for a while. She has seen the [facility] in the past for management. Currently also seeing sports medicine for knee pain; is prescribed chronic Tramadol at this clinic. She ran out of her Tramadol yesterday and does not have another appointment until tomorrow. Her pain has been worse since running out of her medicine. She denies falls, trauma, fevers.  The history is provided by the patient. No language interpreter was used.  Foot Pain       Home Medications Prior to Admission medications   Medication Sig Start Date End Date Taking? Authorizing Provider  albuterol (PROVENTIL HFA;VENTOLIN HFA) 108 (90 Base) MCG/ACT inhaler Inhale 1-2 puffs into the lungs every 6 (six) hours as needed for wheezing or shortness of breath. 06/19/17   Dragnev, Alphonse Guild, NP  busPIRone (BUSPAR) 5 MG tablet Take 1 tablet (5 mg total) by mouth 3 (three) times daily. 01/05/22   Lovorn, Aundra Millet, MD  diclofenac (VOLTAREN) 75 MG EC tablet Take 1 tablet (75 mg total) by mouth 2 (two) times daily. 05/31/21   Lovorn, Aundra Millet, MD  DULoxetine (CYMBALTA) 60 MG capsule Take 2 capsules (120 mg total) by mouth daily. For nerve pain 01/05/22   Lovorn, Aundra Millet, MD  gabapentin (NEURONTIN) 300 MG capsule Take 1 capsule (300 mg total) by mouth 3 (three) times daily as needed (nerve pain). 09/06/22   Rodolph Bong, MD  losartan-hydrochlorothiazide [facility]) 100-25 MG tablet TAKE 1 TABLET BY MOUTH DAILY 09/18/22 12/17/22  Georgina Quint, MD  Multiple Vitamin (MULTIVITAMIN) capsule Take 1 capsule by mouth daily.    [provider]  traMADol (ULTRAM) 50 MG  tablet Take 1 tablet (50 mg total) by mouth every 8 (eight) hours as needed for severe pain. 11/01/22   Rodolph Bong, MD  traZODone (DESYREL) 150 MG tablet Take 1 tablet (150 mg total) by mouth at bedtime. 01/05/22   Lovorn, Aundra Millet, MD  Urea (UREA NAIL) 45 % GEL Apply 1 Application topically daily. 09/11/22   Vivi Barrack, DPM      Allergies    Naproxen    Review of Systems   Review of Systems Ten systems reviewed and are negative for acute change, except as noted in the HPI.    Physical Exam Updated Vital Signs BP (!) 153/54 (BP Location: Right Arm)   Pulse 73   Temp 99.1 F (37.3 C) (Oral)   Resp 20   Ht 5' (1.524 m)   Wt 83.9 kg   SpO2 98%   BMI 36.13 kg/m   Physical Exam Vitals and nursing note reviewed.  Constitutional:      General: She is not in acute distress.    Appearance: She is well-developed. She is not diaphoretic.     Comments: Nontoxic appearing, agitated.  HENT:     Head: Normocephalic and atraumatic.  Eyes:     General: No scleral icterus.    Conjunctiva/sclera: Conjunctivae normal.  Cardiovascular:     Rate and Rhythm: Normal rate and regular rhythm.     Pulses: Normal pulses.     Comments: DP  pulse 2+ b/l Pulmonary:     Effort: Pulmonary effort is normal. No respiratory distress.     Comments: Respirations even and unlabored Musculoskeletal:        General: Normal range of motion.     Cervical back: Normal range of motion.     Comments: Moving all extremities spontaneously. BLE warm, well perfused. Corns and calluses noted to the plantar aspect of feet. No associated erythema, crepitus, deformity, heat to touch, or lymphangitic streaking. Compartments of BLE soft, compressible.  Skin:    General: Skin is warm and dry.     Coloration: Skin is not pale.     Findings: No erythema or rash.  Neurological:     Mental Status: She is alert and oriented to person, place, and time.     Coordination: Coordination normal.  Psychiatric:        Behavior:  Behavior normal.          ED Results / Procedures / Treatments   Labs (all labs ordered are listed, but only abnormal results are displayed) Labs Reviewed - No data to display  EKG None  Radiology No results found.  Procedures Procedures    Medications Ordered in ED Medications  HYDROcodone-acetaminophen (NORCO/VICODIN) 5-325 MG per tablet 1 tablet (1 tablet Oral Given 11/04/22 0530)    ED Course/ Medical Decision Making/ A&P                             Medical Decision Making Risk Prescription drug management.   This patient presents to the ED for concern of foot pain, this involves an extensive number of treatment options, and is a complaint that carries with it a high risk of complications and morbidity.  The differential diagnosis includes chronic pain vs arthritis vs abscess vs cellulitis vs stress fracture   Co morbidities that complicate the patient evaluation  HTN Anemia    Additional history obtained:  External records from outside source obtained and reviewed including sports medicine evaluation in May 2024   Cardiac Monitoring:  The patient was maintained on a cardiac monitor.  I personally viewed and interpreted the cardiac monitored which showed an underlying rhythm of: NSR   Medicines ordered and prescription drug management:  I ordered medication including Norco for pain  I have reviewed the patients home medicines and have made adjustments as needed   Test Considered:  Xray foot   Problem List / ED Course:  Chronic, atraumatic b/l foot pain. No indication for acute imaging. Patient neurovascularly intact on exam. No swelling, erythema, heat to touch to the affected area; no concern for septic joint or cellulitis. Compartments in the affected extremity are soft.  Ran out of chronic pain prescription. Pending another appt tmrw, per patient. Given one dose of Norco for pain control while in the ED.   Reevaluation:  After the  interventions noted above, I reevaluated the patient and found that they have :stayed the same   Dispostion:  After consideration of the diagnostic results and the patients response to treatment, I feel that the patent would benefit from outpatient podiatry and sports medicine follow up. Can see PCP in interim for management, if necessary. Return precautions discussed and provided. Patient discharged in stable condition with no unaddressed concerns.          Final Clinical Impression(s) / ED Diagnoses Final diagnoses:  Chronic foot pain, unspecified laterality    Rx / DC Orders ED Discharge  Orders     None         Antony Madura, PA-C 11/04/22 4696    Tilden Fossa, MD 11/04/22 [phone]

## 2022-11-04 NOTE — ED Notes (Signed)
Pleasantly laying in  hallway stretcher enjoying a can of pringle's.

## 2022-11-04 NOTE — Discharge Instructions (Signed)
Your pain is chronic and should be managed by your existing providers. Call to schedule another appointment with the [facility]. Follow up with your primary care doctor in the interim.

## 2022-11-05 ENCOUNTER — Ambulatory Visit: Payer: Medicare HMO | Admitting: Family Medicine

## 2022-11-05 ENCOUNTER — Telehealth: Payer: Self-pay

## 2022-11-05 NOTE — Telephone Encounter (Signed)
VOB initiated for Zilretta for BILAT knee OA 

## 2022-11-05 NOTE — Telephone Encounter (Signed)
Wendy Carr, [place] [address], [place] Can you please see if pt could be approved for Zilretta bilat knees??

## 2022-11-07 NOTE — Telephone Encounter (Signed)
Prior Auth required for ZILRETTA for BILAT knee OA  e. A prior authorization is required for this patient's plan. The office must contact [phone] to initiate

## 2022-11-13 NOTE — Telephone Encounter (Signed)
Prior Authorization initiated for [facility] via [email or web address] KEY: [id number]

## 2022-11-14 NOTE — Telephone Encounter (Signed)
Outcome Denied on July 9 We cover this drug when our criteria are met. The unmet criteria are: has had poor response to trying TWO intra-articular steroids (for example: triamcinolone, methylprednisolone, betamethasone, dexamethasone) (step therapy requirement). The information provided to US shows that you have only had prior therapy with ONE intra-articular steroid (Triamcinolone). This decision was from [employer] Zilretta (triamcinolone acetonide extended-release injectable suspension) Pharmacy Coverage policy.

## 2022-12-07 ENCOUNTER — Telehealth: Payer: Self-pay | Admitting: Family Medicine

## 2022-12-07 NOTE — Telephone Encounter (Signed)
Pt calling for refill of Tramadol to [facility].  Pt informed provider out of office until Monday.

## 2022-12-10 MED ORDER — TRAMADOL HCL 50 MG PO TABS: 50.0000 mg | ORAL_TABLET | Freq: Three times a day (TID) | ORAL | 0 refills | Status: DC | PRN

## 2022-12-10 NOTE — Telephone Encounter (Signed)
Pt notified via phone 

## 2022-12-10 NOTE — Telephone Encounter (Signed)
Refilled

## 2022-12-15 ENCOUNTER — Other Ambulatory Visit: Payer: Self-pay | Admitting: Family Medicine

## 2022-12-18 NOTE — Telephone Encounter (Signed)
Last OV 09/20/22 Next OV 11/05/22 - NO SHOW Last refill: 09/06/22 Rob Bunting #90/1

## 2022-12-21 ENCOUNTER — Telehealth: Payer: Self-pay | Admitting: Family Medicine

## 2022-12-21 NOTE — Telephone Encounter (Signed)
Patient called requesting a refill on Tramadol to [facility] on [address].

## 2022-12-22 ENCOUNTER — Other Ambulatory Visit: Payer: Self-pay | Admitting: Family Medicine

## 2022-12-24 MED ORDER — TRAMADOL HCL 50 MG PO TABS: 50.0000 mg | ORAL_TABLET | Freq: Three times a day (TID) | ORAL | 0 refills | Status: DC | PRN

## 2022-12-24 NOTE — Telephone Encounter (Signed)
Called pt and advised that rx has been refilled.  

## 2022-12-24 NOTE — Telephone Encounter (Signed)
Done

## 2023-01-09 ENCOUNTER — Other Ambulatory Visit: Payer: Self-pay | Admitting: Family Medicine

## 2023-01-09 NOTE — Telephone Encounter (Signed)
Last OV 09/20/22 Next OV not scheduled  Last refill 12/24/22 Qty #15/0  Forwarding to Dr. Denyse Amass

## 2023-01-09 NOTE — Telephone Encounter (Signed)
Pt requesting Tramadol refill to [facility] [address].

## 2023-01-11 ENCOUNTER — Other Ambulatory Visit: Payer: Self-pay | Admitting: Physical Medicine and Rehabilitation

## 2023-01-11 ENCOUNTER — Telehealth: Payer: Self-pay | Admitting: *Deleted

## 2023-01-11 NOTE — Telephone Encounter (Signed)
Paper refill request for Duloxetine 60 mg and Buspirone 5 mg

## 2023-01-22 ENCOUNTER — Other Ambulatory Visit: Payer: Self-pay | Admitting: Family Medicine

## 2023-01-22 MED ORDER — TRAMADOL HCL 50 MG PO TABS: 50.0000 mg | ORAL_TABLET | Freq: Three times a day (TID) | ORAL | 0 refills | Status: DC | PRN

## 2023-01-22 NOTE — Telephone Encounter (Signed)
Patient called requesting a refill on on traMADol (ULTRAM) 50 MG tablet.  [facility] - [address]

## 2023-01-22 NOTE — Telephone Encounter (Signed)
Last refill 01/10/23 Qty #15/0  Controlled substance, forwarding to Dr. Denyse Amass.

## 2023-01-29 NOTE — Telephone Encounter (Signed)
Medication was sent in   

## 2023-02-06 ENCOUNTER — Other Ambulatory Visit: Payer: Self-pay | Admitting: Emergency Medicine

## 2023-02-06 DIAGNOSIS — I10 Essential (primary) hypertension: Secondary | ICD-10-CM

## 2023-02-08 ENCOUNTER — Other Ambulatory Visit: Payer: Self-pay | Admitting: Family Medicine

## 2023-02-08 NOTE — Telephone Encounter (Signed)
Last OV 09/13/22 Next OV 11/05/22 - N/S  Last refill 12/18/22 Qty #90/1

## 2023-02-12 ENCOUNTER — Telehealth: Payer: Self-pay | Admitting: Family Medicine

## 2023-02-12 NOTE — Telephone Encounter (Signed)
Pt requesting refill of Tramadol to [facility] on [address].

## 2023-02-12 NOTE — Telephone Encounter (Signed)
Last OV 09/20/22 Next OV 11/05/22 NO SHOW  Last refill: 01/22/23 Qty #15/0  Controlled substance, forwarding to Dr. Denyse Amass

## 2023-02-13 NOTE — Telephone Encounter (Signed)
Patient called back to follow up on this refill request.

## 2023-02-13 NOTE — Telephone Encounter (Signed)
Dr. Denyse Amass advised that pt is waiting on refill.

## 2023-02-14 MED ORDER — TRAMADOL HCL 50 MG PO TABS: 50.0000 mg | ORAL_TABLET | Freq: Three times a day (TID) | ORAL | 0 refills | Status: DC | PRN

## 2023-02-14 NOTE — Telephone Encounter (Signed)
Called pt and advised.  

## 2023-02-14 NOTE — Telephone Encounter (Signed)
Medicines refilled 

## 2023-02-21 NOTE — Telephone Encounter (Signed)
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

## 2023-02-27 NOTE — Telephone Encounter (Addendum)
Prior Auth NOT required for [employer] undisclosed

## 2023-02-28 ENCOUNTER — Telehealth: Payer: Self-pay | Admitting: Family Medicine

## 2023-02-28 MED ORDER — TRAMADOL HCL 50 MG PO TABS: 50.0000 mg | ORAL_TABLET | Freq: Three times a day (TID) | ORAL | 0 refills | Status: DC | PRN

## 2023-02-28 NOTE — Telephone Encounter (Signed)
Please schedule an appointment with me.  I have refilled your medication but we need to come up with a better long-term plan for pain management for you.

## 2023-02-28 NOTE — Telephone Encounter (Signed)
Pt requesting a refill of Tramadol to [facility].

## 2023-03-01 NOTE — Telephone Encounter (Signed)
Please reach out to pt to assist with scheduling f/u visit to discuss pain management.

## 2023-03-01 NOTE — Telephone Encounter (Signed)
Spoke to patient. She will call back to schedule.

## 2023-03-05 NOTE — Telephone Encounter (Signed)
Orthovisc for BILAT knee OA OK to schedule on or after 03/24/23   Primary Insurance: [employer] HMO Co-Pay: $10 Co-Insurance: 20% Deductible: does not apply   Prior Auth: APPROVED [place] Case ID #: [id number] Valid: 08/30/22-05/07/23     Knee injection history: 07/25/22 - Kenalog BILAT 09/06/22 - Orthovisc #1 BILAT 09/13/22 - Orthovisc #2 BILAT 09/20/22 - Orthovisc #3 BILAT

## 2023-03-07 NOTE — Telephone Encounter (Signed)
Holding until needed by patient.

## 2023-03-28 ENCOUNTER — Telehealth: Payer: Self-pay | Admitting: Podiatry

## 2023-03-28 NOTE — Telephone Encounter (Signed)
Patient called and is very emotionally overwhelmed she is in tears in regards to her feet and her being in constant and complete pain, I did manage to help calm her down but is there anything we can do to get her in sooner or a medication of some sort? Again the patient is very emotionally consumed and needs assistance immediately. I did NOT sched an appointment with any providers just because it would stress her emotionally to know we don't have any openings on paper.

## 2023-03-29 ENCOUNTER — Ambulatory Visit (INDEPENDENT_AMBULATORY_CARE_PROVIDER_SITE_OTHER): Payer: Medicare PPO

## 2023-03-29 ENCOUNTER — Ambulatory Visit (INDEPENDENT_AMBULATORY_CARE_PROVIDER_SITE_OTHER): Payer: Medicare HMO | Admitting: Podiatry

## 2023-03-29 ENCOUNTER — Encounter: Payer: Self-pay | Admitting: Podiatry

## 2023-03-29 DIAGNOSIS — M216X2 Other acquired deformities of left foot: Secondary | ICD-10-CM

## 2023-03-29 DIAGNOSIS — G629 Polyneuropathy, unspecified: Secondary | ICD-10-CM | POA: Diagnosis not present

## 2023-03-29 DIAGNOSIS — M7751 Other enthesopathy of right foot: Secondary | ICD-10-CM | POA: Diagnosis not present

## 2023-03-29 DIAGNOSIS — L84 Corns and callosities: Secondary | ICD-10-CM

## 2023-03-29 DIAGNOSIS — M216X1 Other acquired deformities of right foot: Secondary | ICD-10-CM | POA: Diagnosis not present

## 2023-03-30 NOTE — Progress Notes (Signed)
Subjective:   Patient ID: Wendy Carr, female   DOB: 67 y.o.   MRN: [id number]   HPI Patient presents with very painful calluses bilateral and also is noted to have discomfort in the right big toe with inflammation of the joint surface fluid buildup and pain.  Patient states she tries creams for her pain cannot take medications of success and has been going on a couple years   ROS      Objective:  Physical Exam  Neurovascular status found to be intact with patient noted to have pain of the inner phalangeal joint right big toe with also noted to have generalized foot pain but has not seen a back doctor which shooting pains into both feet.  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

## 2023-04-28 ENCOUNTER — Other Ambulatory Visit: Payer: Self-pay | Admitting: Emergency Medicine

## 2023-04-28 DIAGNOSIS — I10 Essential (primary) hypertension: Secondary | ICD-10-CM

## 2023-05-02 NOTE — Telephone Encounter (Signed)
BUY AND BILL  Prior Auth initiated for ORTHOVISC for BILAT knee OA via [email or web address] KEY: [id number]

## 2023-05-06 NOTE — Telephone Encounter (Signed)
Will need to re-verify benefits for 2025 on or after 05/08/23.

## 2023-05-06 NOTE — Telephone Encounter (Signed)
ORTHOVISC for BILAT knee OA   Primary Insurance: [employer] Medicare Co-pay: $0 Co-insurance: 0% Deductible: does not apply Prior Auth: NOT required   Knee Injection History 09/06/22 - Orthovisc #1 09/13/22 - Orthovisc #2 09/20/22 - Orthovisc #3

## 2023-05-06 NOTE — Telephone Encounter (Signed)
Prior Auth NOT required for Orthovisc EOC [id number]

## 2023-05-24 NOTE — Telephone Encounter (Signed)
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  

## 2023-05-27 NOTE — Telephone Encounter (Signed)
ORTHOVISC for BILAT knee OA   [employer]  Primary Insurance: [employer] Medicare Adv Co-pay: n/a Co-insurance: undisclosed Deductible: does not apply Prior Auth: NOT required   Knee Injection History 09/06/22 - Orthovisc #1 09/13/22 - Orthovisc #2 09/20/22 - Orthovisc #3

## 2023-05-27 NOTE — Telephone Encounter (Signed)
 [employer] - Prior Authorization NOT required

## 2023-06-21 DIAGNOSIS — H2513 Age-related nuclear cataract, bilateral: Secondary | ICD-10-CM | POA: Diagnosis not present

## 2023-07-16 ENCOUNTER — Other Ambulatory Visit: Payer: Self-pay | Admitting: Emergency Medicine

## 2023-07-16 DIAGNOSIS — I10 Essential (primary) hypertension: Secondary | ICD-10-CM

## 2023-10-02 ENCOUNTER — Other Ambulatory Visit: Payer: Self-pay | Admitting: Emergency Medicine

## 2023-10-02 DIAGNOSIS — I10 Essential (primary) hypertension: Secondary | ICD-10-CM

## 2023-10-08 ENCOUNTER — Ambulatory Visit: Admitting: Emergency Medicine

## 2023-10-09 ENCOUNTER — Ambulatory Visit: Admitting: Emergency Medicine

## 2023-10-25 DIAGNOSIS — M79672 Pain in left foot: Secondary | ICD-10-CM | POA: Diagnosis not present

## 2023-10-25 DIAGNOSIS — M79671 Pain in right foot: Secondary | ICD-10-CM | POA: Diagnosis not present

## 2023-10-25 DIAGNOSIS — Z0000 Encounter for general adult medical examination without abnormal findings: Secondary | ICD-10-CM | POA: Diagnosis not present

## 2023-10-25 DIAGNOSIS — Z6831 Body mass index (BMI) 31.0-31.9, adult: Secondary | ICD-10-CM | POA: Diagnosis not present

## 2023-10-25 DIAGNOSIS — J45909 Unspecified asthma, uncomplicated: Secondary | ICD-10-CM | POA: Diagnosis not present

## 2023-10-25 DIAGNOSIS — Z79899 Other long term (current) drug therapy: Secondary | ICD-10-CM | POA: Diagnosis not present

## 2023-10-25 DIAGNOSIS — M1990 Unspecified osteoarthritis, unspecified site: Secondary | ICD-10-CM | POA: Diagnosis not present

## 2023-10-25 DIAGNOSIS — I10 Essential (primary) hypertension: Secondary | ICD-10-CM | POA: Diagnosis not present

## 2023-10-25 DIAGNOSIS — E669 Obesity, unspecified: Secondary | ICD-10-CM | POA: Diagnosis not present

## 2023-10-31 DIAGNOSIS — Z79899 Other long term (current) drug therapy: Secondary | ICD-10-CM | POA: Diagnosis not present

## 2023-10-31 DIAGNOSIS — E669 Obesity, unspecified: Secondary | ICD-10-CM | POA: Diagnosis not present

## 2023-10-31 DIAGNOSIS — I10 Essential (primary) hypertension: Secondary | ICD-10-CM | POA: Diagnosis not present

## 2023-10-31 DIAGNOSIS — I110 Hypertensive heart disease with heart failure: Secondary | ICD-10-CM | POA: Diagnosis not present

## 2023-10-31 DIAGNOSIS — L84 Corns and callosities: Secondary | ICD-10-CM | POA: Diagnosis not present

## 2023-10-31 DIAGNOSIS — Z139 Encounter for screening, unspecified: Secondary | ICD-10-CM | POA: Diagnosis not present

## 2023-10-31 DIAGNOSIS — F331 Major depressive disorder, recurrent, moderate: Secondary | ICD-10-CM | POA: Diagnosis not present

## 2023-10-31 DIAGNOSIS — Z6831 Body mass index (BMI) 31.0-31.9, adult: Secondary | ICD-10-CM | POA: Diagnosis not present

## 2023-12-03 ENCOUNTER — Other Ambulatory Visit: Payer: Self-pay | Admitting: Family Medicine

## 2023-12-03 DIAGNOSIS — Z1231 Encounter for screening mammogram for malignant neoplasm of breast: Secondary | ICD-10-CM

## 2023-12-26 NOTE — Telephone Encounter (Signed)
 It has been > 1 year since last injection. Closing encounter.

## 2024-02-04 ENCOUNTER — Other Ambulatory Visit: Payer: Self-pay | Admitting: Family Medicine

## 2024-02-04 DIAGNOSIS — K7460 Unspecified cirrhosis of liver: Secondary | ICD-10-CM

## 2024-02-05 ENCOUNTER — Other Ambulatory Visit

## 2024-02-06 ENCOUNTER — Encounter: Payer: Self-pay | Admitting: Family Medicine

## 2024-02-06 ENCOUNTER — Other Ambulatory Visit: Payer: Self-pay

## 2024-02-06 ENCOUNTER — Ambulatory Visit

## 2024-02-06 ENCOUNTER — Ambulatory Visit: Admitting: Family Medicine

## 2024-02-06 VITALS — BP 164/92 | HR 66 | Ht 60.0 in | Wt 180.0 lb

## 2024-02-06 DIAGNOSIS — M170 Bilateral primary osteoarthritis of knee: Secondary | ICD-10-CM

## 2024-02-06 DIAGNOSIS — G8929 Other chronic pain: Secondary | ICD-10-CM

## 2024-02-06 DIAGNOSIS — M25561 Pain in right knee: Secondary | ICD-10-CM | POA: Diagnosis not present

## 2024-02-06 DIAGNOSIS — M25562 Pain in left knee: Secondary | ICD-10-CM

## 2024-02-06 MED ORDER — TRAMADOL HCL 50 MG PO TABS: 50.0000 mg | ORAL_TABLET | Freq: Three times a day (TID) | ORAL | 0 refills | Status: DC | PRN

## 2024-02-06 NOTE — Progress Notes (Signed)
 LILLETTE Ileana Collet, PhD, LAT, ATC acting as a scribe for Artist Lloyd, MD.  Wendy Carr is a 68 y.o. female who presents to [facility] today for exacerbation of her bilat knee pain. Pt was last seen by Dr. Lloyd on 09/20/22 and completed the Orthovisc series, 3/3, bilaterally.  Today, pt reports exacerbation of bilat knee pain, mostly the L knee. Sx responded well to Orthovisc injection. Knee pain returned about a month ago.   Dx imaging: 07/25/22 R & L knee XR   Pertinent review of systems: No fevers or chills  Relevant historical information: Chronic pain   Exam:  BP (!) 164/92   Pulse 66   Ht 5' (1.524 m)   Wt 180 lb (81.6 kg)   SpO2 99%   BMI 35.15 kg/m  General: Well Developed, well nourished, and in no acute distress.   MSK: Knees bilaterally mild effusion normal-appearing otherwise appearing    Lab and Radiology Results  Procedure: Real-time Ultrasound Guided Injection of left knee joint superior lateral patella space Device: Philips Affiniti 50G/GE Logiq Images permanently stored and available for review in PACS Verbal informed consent obtained.  Discussed risks and benefits of procedure. Warned about infection, bleeding, hyperglycemia damage to structures among others. Patient expresses understanding and agreement Time-out conducted.   Noted no overlying erythema, induration, or other signs of local infection.   Skin prepped in a sterile fashion.   Local anesthesia: Topical Ethyl chloride.   With sterile technique and under real time ultrasound guidance: 40 mg of Kenalog  and 2 mL of Marcaine injected into knee joint. Fluid seen entering the joint capsule.   Completed without difficulty   Pain immediately resolved suggesting accurate placement of the medication.   Advised to call if fevers/chills, erythema, induration, drainage, or persistent bleeding.   Images permanently stored and available for review in the ultrasound unit.   Impression: Technically successful ultrasound guided injection.   Procedure: Real-time Ultrasound Guided Injection of right knee joint superior lateral patella space Device: Philips Affiniti 50G/GE Logiq Images permanently stored and available for review in PACS Verbal informed consent obtained.  Discussed risks and benefits of procedure. Warned about infection, bleeding, hyperglycemia damage to structures among others. Patient expresses understanding and agreement Time-out conducted.   Noted no overlying erythema, induration, or other signs of local infection.   Skin prepped in a sterile fashion.   Local anesthesia: Topical Ethyl chloride.   With sterile technique and under real time ultrasound guidance: 40 mg of Kenalog  and 2 mL of Marcaine injected into knee joint. Fluid seen entering the joint capsule.   Completed without difficulty   Pain immediately resolved suggesting accurate placement of the medication.   Advised to call if fevers/chills, erythema, induration, drainage, or persistent bleeding.   Images permanently stored and available for review in the ultrasound unit.  Impression: Technically successful ultrasound guided injection.    X-ray images bilateral knees obtained today personally and independently interpreted.  Right knee: Mild DJD.  No acute fractures.  Left knee: Mild DJD.  No acute fractures.  Await formal radiology review   Assessment and Plan: 68 y.o. 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  Plan for steroid injection today.  Limited prescription of tramadol .  Will not refill.  If needed could proceed to gel injections.   PDMP reviewed during this encounter. Orders Placed This Encounter  Procedures   US  LIMITED JOINT SPACE STRUCTURES LOW BILAT(NO LINKED CHARGES)    Reason  for Exam (SYMPTOM  OR DIAGNOSIS REQUIRED):   bilat knee pain    Preferred imaging location?:   [facility] Knee AP/LAT W/Sun[place] Right     Standing Status:   Future    Number of Occurrences:   1    Expiration Date:   03/08/2024    Reason for Exam (SYMPTOM  OR DIAGNOSIS REQUIRED):   bilat knee pain    Preferred imaging location?:   [facility]   DG Knee AP/LAT W/Sunrise Left    Standing Status:   Future    Number of Occurrences:   1    Expiration Date:   03/08/2024    Reason for Exam (SYMPTOM  OR DIAGNOSIS REQUIRED):   bilat knee pain    Preferred imaging location?:   [facility]   Meds ordered this encounter  Medications   traMADol  (ULTRAM ) 50 MG tablet    Sig: Take 1 tablet (50 mg total) by mouth every 8 (eight) hours as needed for severe pain (pain score 7-10).    Dispense:  5 tablet    Refill:  0     Discussed warning signs or symptoms. Please see discharge instructions. Patient expresses understanding.   The above documentation has been reviewed and is accurate and complete Artist Lloyd, M.D.

## 2024-02-06 NOTE — Patient Instructions (Addendum)
 Thank you for coming in today.   You received an injection today. Seek immediate medical attention if the joint becomes red, extremely painful, or is oozing fluid.   Please get an Xray today before you leave   See you back as needed.

## 2024-02-12 ENCOUNTER — Other Ambulatory Visit

## 2024-02-12 ENCOUNTER — Ambulatory Visit: Payer: Self-pay | Admitting: Family Medicine

## 2024-02-12 NOTE — Progress Notes (Signed)
Right knee x-ray looks okay to radiology.

## 2024-02-12 NOTE — Progress Notes (Signed)
Left knee x-ray looks okay to radiology

## 2024-02-28 ENCOUNTER — Telehealth: Payer: Self-pay | Admitting: Family Medicine

## 2024-02-28 NOTE — Telephone Encounter (Signed)
 Pt requesting refill of Tramadol  to [facility]. Last injection did not work per pt.

## 2024-03-02 MED ORDER — TRAMADOL HCL 50 MG PO TABS: 50.0000 mg | ORAL_TABLET | Freq: Three times a day (TID) | ORAL | 0 refills | Status: AC | PRN

## 2024-03-02 NOTE — Telephone Encounter (Signed)
 Medication refilled

## 2024-04-24 ENCOUNTER — Encounter (HOSPITAL_COMMUNITY): Payer: Self-pay | Admitting: *Deleted

## 2024-04-24 ENCOUNTER — Emergency Department (HOSPITAL_COMMUNITY): Admission: EM | Admit: 2024-04-24 | Discharge: 2024-04-25

## 2024-04-24 ENCOUNTER — Other Ambulatory Visit: Payer: Self-pay

## 2024-04-24 ENCOUNTER — Emergency Department (HOSPITAL_COMMUNITY)

## 2024-04-24 DIAGNOSIS — R519 Headache, unspecified: Secondary | ICD-10-CM | POA: Diagnosis present

## 2024-04-24 DIAGNOSIS — Y92410 Unspecified street and highway as the place of occurrence of the external cause: Secondary | ICD-10-CM | POA: Diagnosis not present
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
## 2024-04-25 MED ORDER — ACETAMINOPHEN 325 MG PO TABS
ORAL_TABLET | ORAL | Status: AC
  Filled 2024-04-25: qty 2

## 2024-04-25 MED ORDER — ACETAMINOPHEN 325 MG PO TABS
650.0000 mg | ORAL_TABLET | Freq: Once | ORAL | Status: AC
  Administered 2024-04-25: 650 mg via ORAL

## 2024-04-25 NOTE — ED Notes (Signed)
 Patient said she did not want to stay.

## 2024-04-25 NOTE — ED Notes (Signed)
 The pt was asking for pain med  she told the nt in sort that she had an order for pain med that was not given.  No order was ever given by the pa  that information was given to the pt.  When she was told I was giving her tylenol  she began to cry and said that was not  going to help her pain  but she did swallow the tylenol
